# Patient Record
Sex: Female | Born: 1955 | Race: White | Hispanic: No | Marital: Single | Smoking: Current every day smoker
Health system: Southern US, Community
[De-identification: ages and names within clinical notes are randomized; demographics above are authoritative.]

## PROBLEM LIST (undated history)

## (undated) DIAGNOSIS — F988 Other specified behavioral and emotional disorders with onset usually occurring in childhood and adolescence: Secondary | ICD-10-CM

## (undated) DIAGNOSIS — F419 Anxiety disorder, unspecified: Secondary | ICD-10-CM

## (undated) DIAGNOSIS — Z87442 Personal history of urinary calculi: Secondary | ICD-10-CM

## (undated) DIAGNOSIS — K219 Gastro-esophageal reflux disease without esophagitis: Secondary | ICD-10-CM

## (undated) DIAGNOSIS — M5126 Other intervertebral disc displacement, lumbar region: Secondary | ICD-10-CM

## (undated) DIAGNOSIS — F32A Depression, unspecified: Secondary | ICD-10-CM

## (undated) DIAGNOSIS — F1990 Other psychoactive substance use, unspecified, uncomplicated: Secondary | ICD-10-CM

## (undated) DIAGNOSIS — Z720 Tobacco use: Secondary | ICD-10-CM

## (undated) DIAGNOSIS — J449 Chronic obstructive pulmonary disease, unspecified: Secondary | ICD-10-CM

## (undated) DIAGNOSIS — K311 Adult hypertrophic pyloric stenosis: Secondary | ICD-10-CM

## (undated) DIAGNOSIS — Z87440 Personal history of urinary (tract) infections: Secondary | ICD-10-CM

## (undated) DIAGNOSIS — F329 Major depressive disorder, single episode, unspecified: Secondary | ICD-10-CM

## (undated) DIAGNOSIS — M5136 Other intervertebral disc degeneration, lumbar region: Secondary | ICD-10-CM

## (undated) DIAGNOSIS — K259 Gastric ulcer, unspecified as acute or chronic, without hemorrhage or perforation: Secondary | ICD-10-CM

## (undated) DIAGNOSIS — M51369 Other intervertebral disc degeneration, lumbar region without mention of lumbar back pain or lower extremity pain: Secondary | ICD-10-CM

## (undated) DIAGNOSIS — D259 Leiomyoma of uterus, unspecified: Secondary | ICD-10-CM

## (undated) DIAGNOSIS — E785 Hyperlipidemia, unspecified: Secondary | ICD-10-CM

## (undated) HISTORY — DX: Other psychoactive substance use, unspecified, uncomplicated: F19.90

## (undated) HISTORY — DX: Leiomyoma of uterus, unspecified: D25.9

## (undated) HISTORY — PX: EXPLORATION MIDDLE EAR: SUR585

## (undated) HISTORY — DX: Other specified behavioral and emotional disorders with onset usually occurring in childhood and adolescence: F98.8

## (undated) HISTORY — PX: TONSILLECTOMY: SUR1361

## (undated) HISTORY — DX: Chronic obstructive pulmonary disease, unspecified: J44.9

## (undated) HISTORY — DX: Tobacco use: Z72.0

## (undated) HISTORY — DX: Personal history of urinary (tract) infections: Z87.440

## (undated) HISTORY — PX: HERNIA REPAIR: SHX51

## (undated) HISTORY — DX: Personal history of urinary calculi: Z87.442

## (undated) HISTORY — PX: ESOPHAGEAL DILATION: SHX303

## (undated) HISTORY — DX: Gastro-esophageal reflux disease without esophagitis: K21.9

## (undated) HISTORY — PX: FETAL SURGERY FOR CONGENITAL HERNIA: SHX1618

## (undated) HISTORY — DX: Adult hypertrophic pyloric stenosis: K31.1

## (undated) HISTORY — DX: Major depressive disorder, single episode, unspecified: F32.9

## (undated) HISTORY — DX: Depression, unspecified: F32.A

## (undated) HISTORY — DX: Gastric ulcer, unspecified as acute or chronic, without hemorrhage or perforation: K25.9

## (undated) HISTORY — DX: Anxiety disorder, unspecified: F41.9

---

## 1983-10-27 HISTORY — PX: LITHOTRIPSY: SUR834

## 2005-08-06 ENCOUNTER — Emergency Department (HOSPITAL_COMMUNITY): Admission: EM | Admit: 2005-08-06 | Discharge: 2005-08-06 | Payer: Self-pay | Admitting: Emergency Medicine

## 2005-08-06 IMAGING — US US ABDOMEN COMPLETE
1 series · 13 of 25 positions shown · non-contrast
Comparison: None.

CLINICAL DATA: Abdominal pain.
ABDOMEN ULTRASOUND:
TECHNIQUE: Complete abdominal ultrasound examination was performed including evaluation of the liver, gallbladder, bile ducts, pancreas, kidneys, spleen, IVC, and abdominal aorta.

[Series 1: unknown · 0.28mm/px · 13 of 91 slices shown]
[im 1/91]
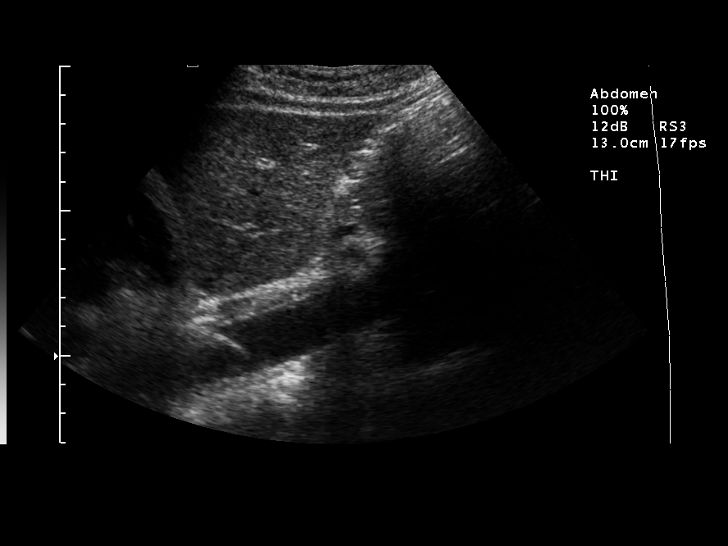
[im 8/91]
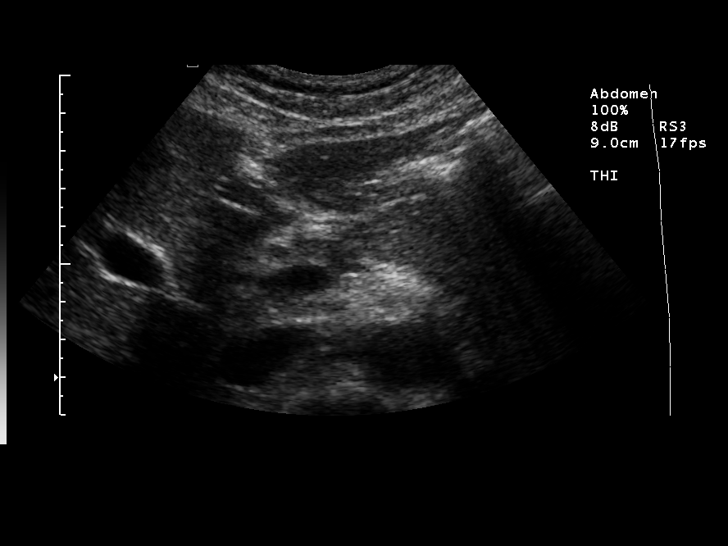
[im 16/91]
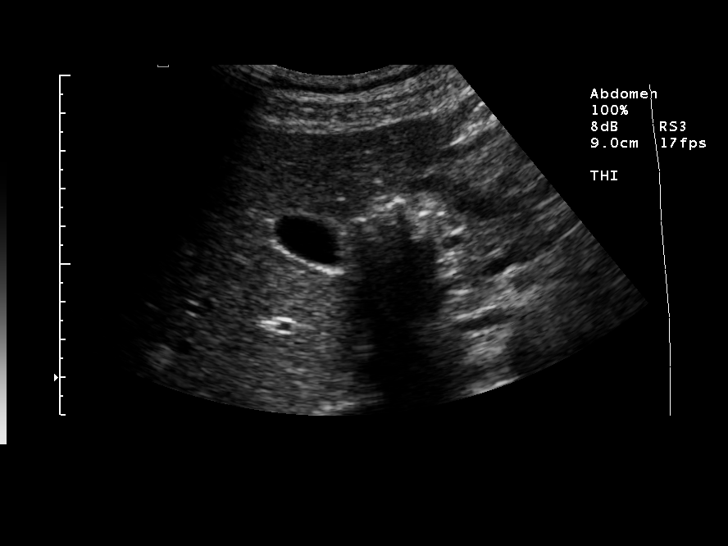
[im 23/91]
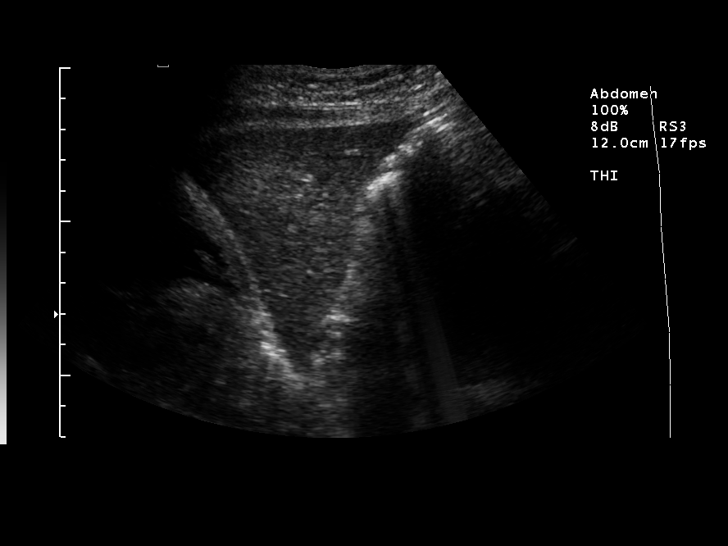
[im 31/91]
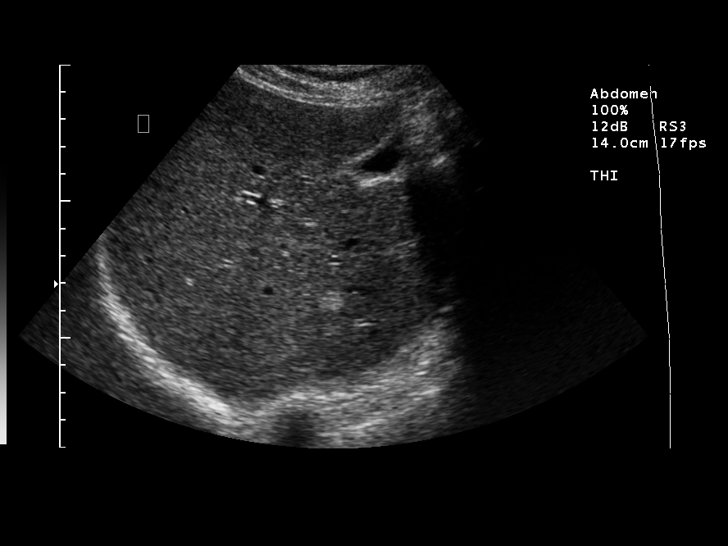
[im 38/91]
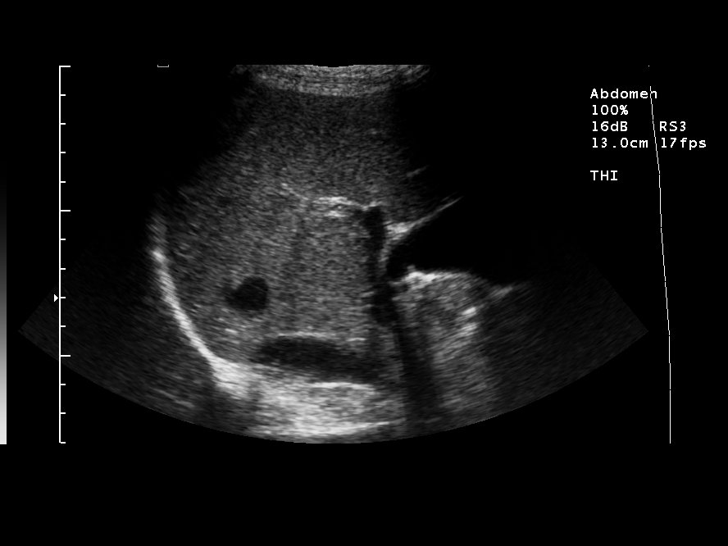
[im 46/91]
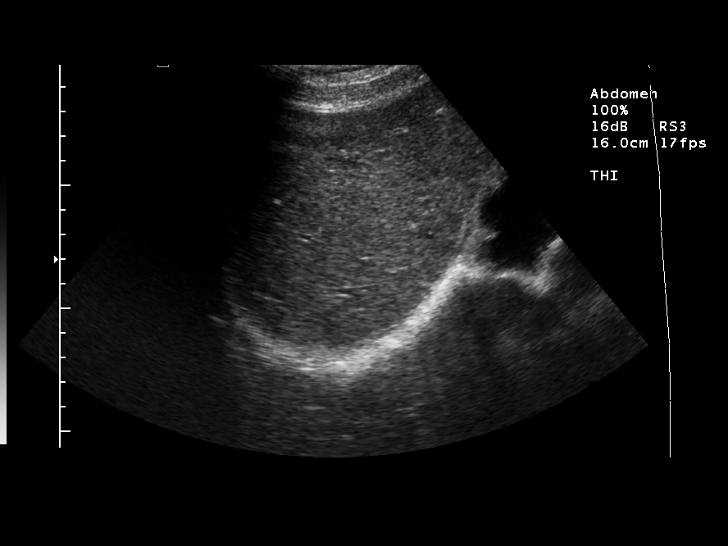
[im 53/91]
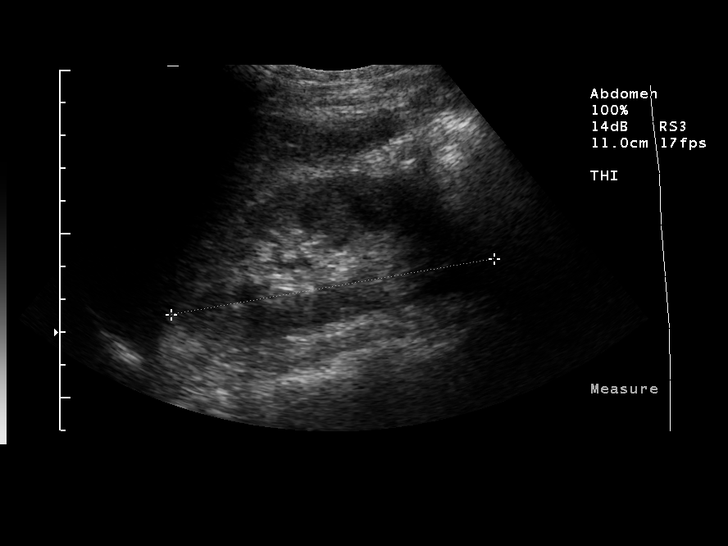
[im 61/91]
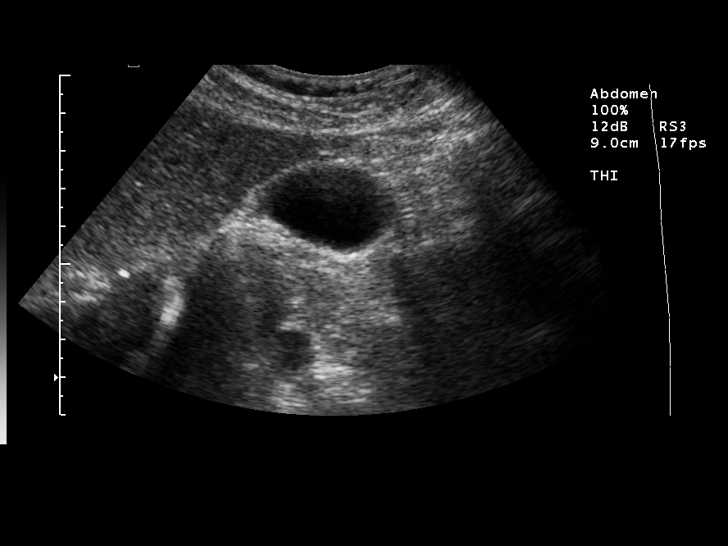
[im 68/91]
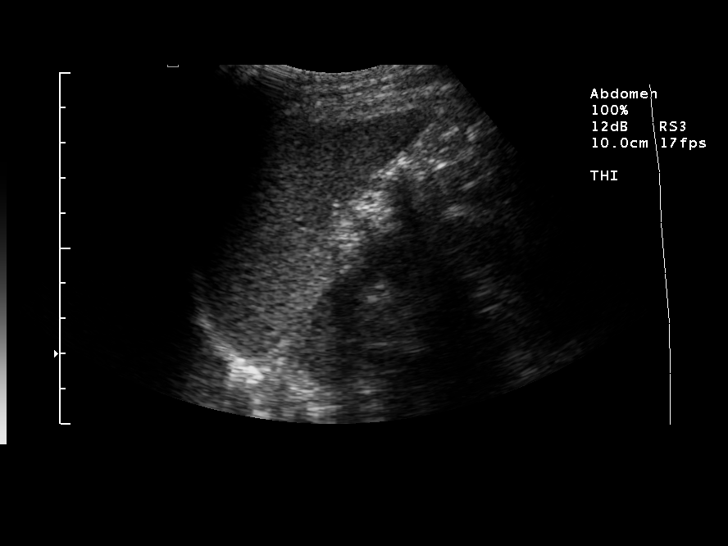
[im 76/91]
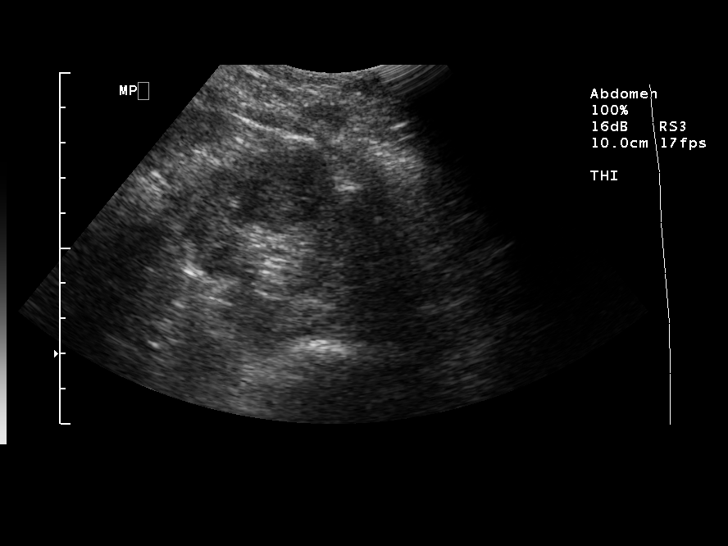
[im 83/91]
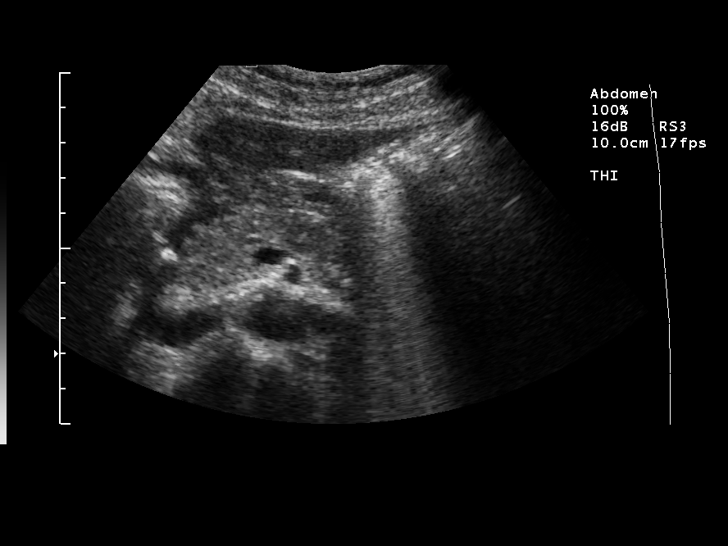
[im 91/91]
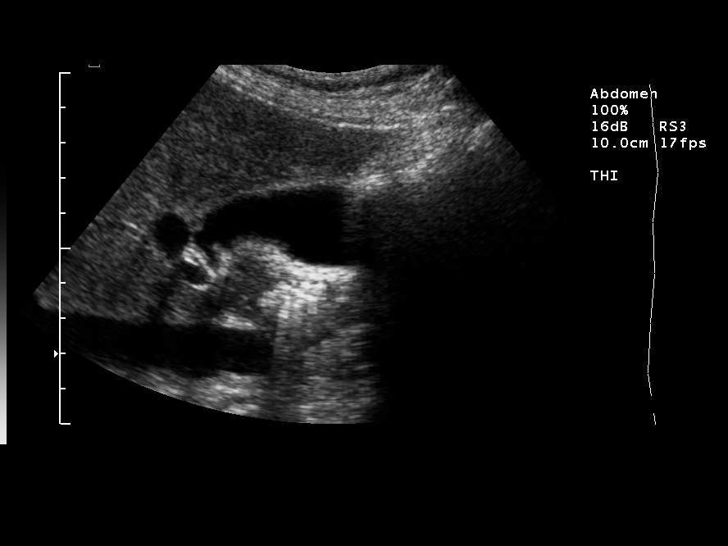

[13 of 25 positions shown; findings below may reference images not displayed]

FINDINGS: The gallbladder appears normal without gallstones or wall thickening.  There is no biliary dilatation. A single uniformly echogenic lesion is noted in the right upper hepatic lobe, measuring 1 cm in greatest dimension. This is most likely an incidental hemangioma.  No other liver lesions are identified.  There is no biliary dilatation. The visualized portions of the spleen, pancreas, inferior vena cava and abdominal aorta appear normal. Portions of the pancreatic tail are obscured by bowel gas. Both kidneys appear normal measuring 10.0 cm in length on the right [OQ] cm on the left.  There is no ascites.
IMPRESSION: 1.  No acute abdominal findings are demonstrated.
2.  Single echogenic liver lesion most likely reflects a hemangioma.  Its stability could be assessed by performing a follow-up hepatic ultrasound in 4 to 6 months.  If it were clinically necessary to evaluate this further now, contrast enhanced MRI would be the optimal modality.

## 2005-08-17 ENCOUNTER — Ambulatory Visit: Payer: Self-pay | Admitting: Internal Medicine

## 2005-08-24 ENCOUNTER — Ambulatory Visit: Payer: Self-pay | Admitting: Internal Medicine

## 2005-08-24 ENCOUNTER — Ambulatory Visit: Payer: Self-pay | Admitting: Gastroenterology

## 2005-09-07 ENCOUNTER — Ambulatory Visit: Payer: Self-pay | Admitting: Gastroenterology

## 2005-09-07 ENCOUNTER — Encounter (INDEPENDENT_AMBULATORY_CARE_PROVIDER_SITE_OTHER): Payer: Self-pay | Admitting: *Deleted

## 2005-10-09 ENCOUNTER — Ambulatory Visit: Payer: Self-pay | Admitting: Internal Medicine

## 2005-10-22 ENCOUNTER — Encounter: Payer: Self-pay | Admitting: Internal Medicine

## 2005-10-22 ENCOUNTER — Ambulatory Visit: Payer: Self-pay

## 2005-10-30 ENCOUNTER — Ambulatory Visit: Payer: Self-pay | Admitting: Internal Medicine

## 2006-07-01 ENCOUNTER — Ambulatory Visit: Payer: Self-pay | Admitting: Internal Medicine

## 2006-07-06 ENCOUNTER — Ambulatory Visit (HOSPITAL_COMMUNITY): Admission: RE | Admit: 2006-07-06 | Discharge: 2006-07-06 | Payer: Self-pay | Admitting: Internal Medicine

## 2006-07-06 ENCOUNTER — Ambulatory Visit: Payer: Self-pay | Admitting: Internal Medicine

## 2006-09-23 IMAGING — CR DG CHEST 1V PORT
1 series · 1 of 1 positions shown · non-contrast
Comparison: None.

CLINICAL DATA: Chest pain. 
 PORTABLE CHEST - [W1]:

[view not recorded]
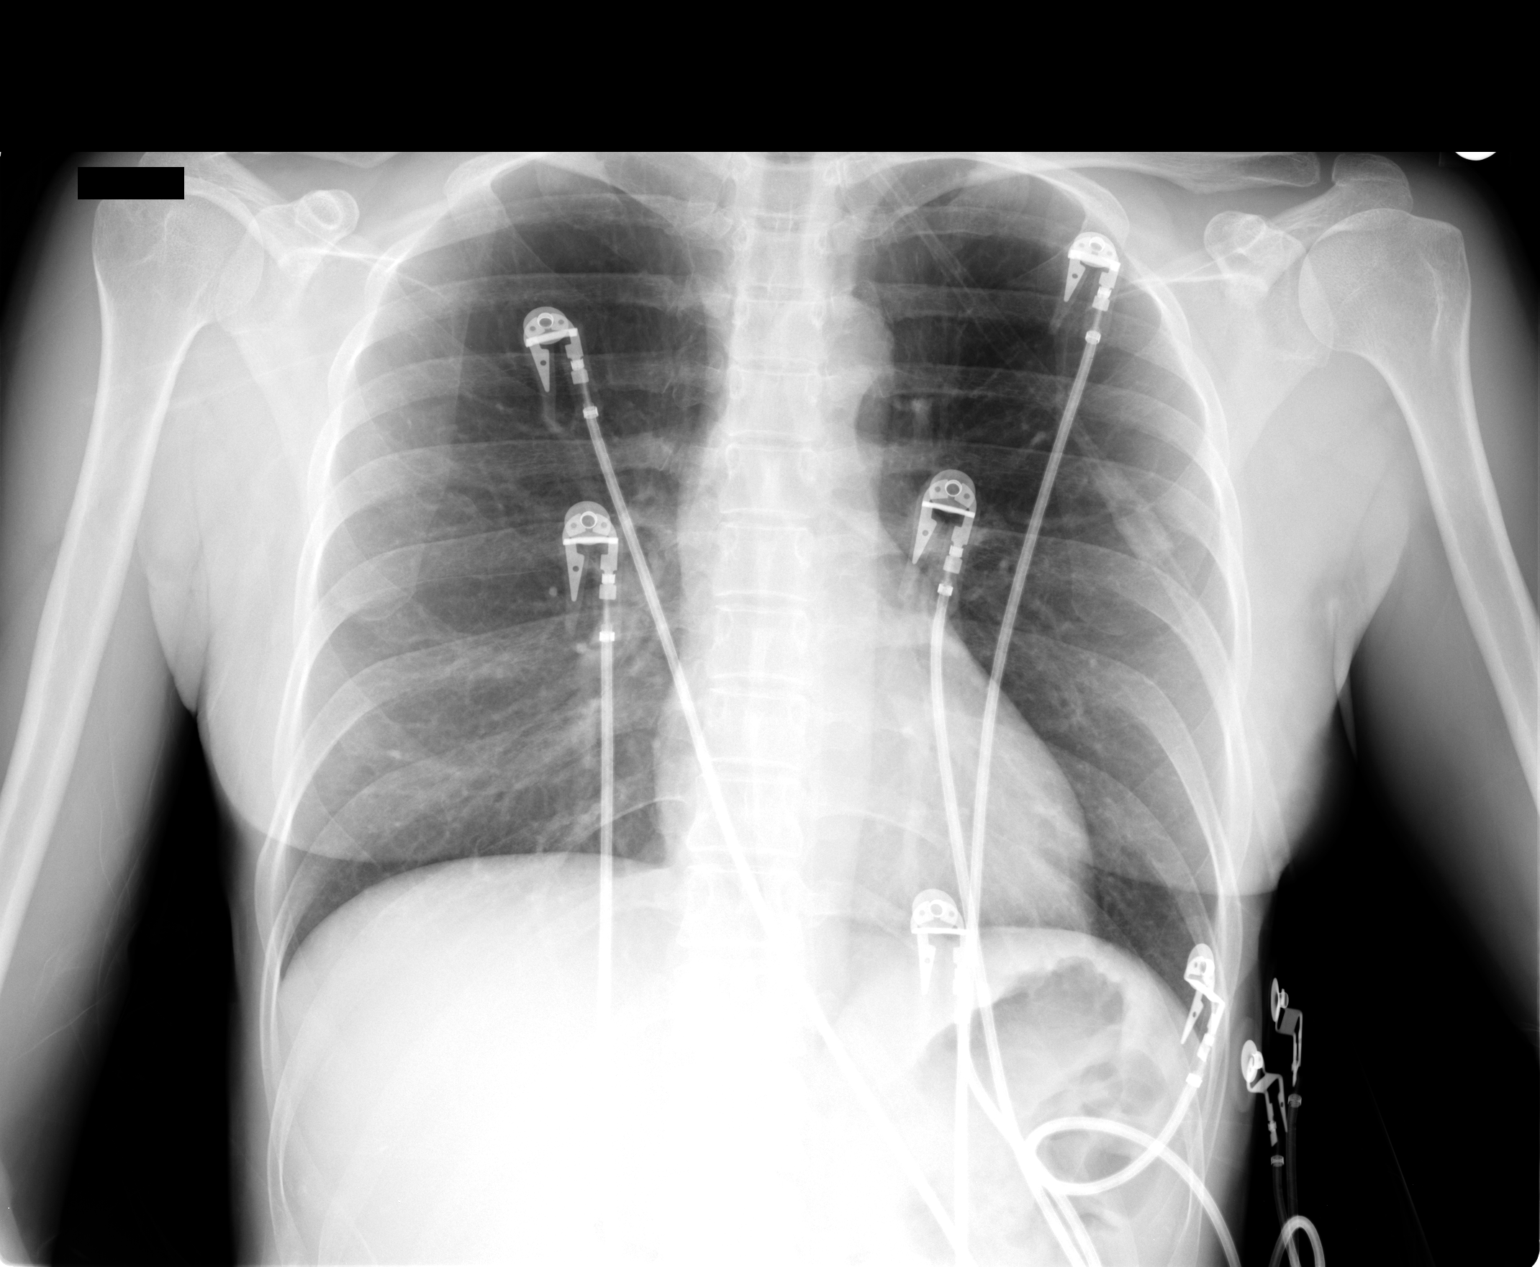

[1 of 1 positions shown; findings below may reference images not displayed]

FINDINGS: Heart and lung s within normal limits for a one view portable exam.
IMPRESSION: No active disease.

## 2007-08-23 IMAGING — CR DG RIBS W/ CHEST 3+V*R*
5 series · 5 of 5 positions shown · non-contrast
Comparison: Portable chest [DATE].

CLINICAL DATA: Pain after a fall.  
 RIGHT RIBS - 4 VIEW AND 1 VIEW CHEST:

[w chest pa]
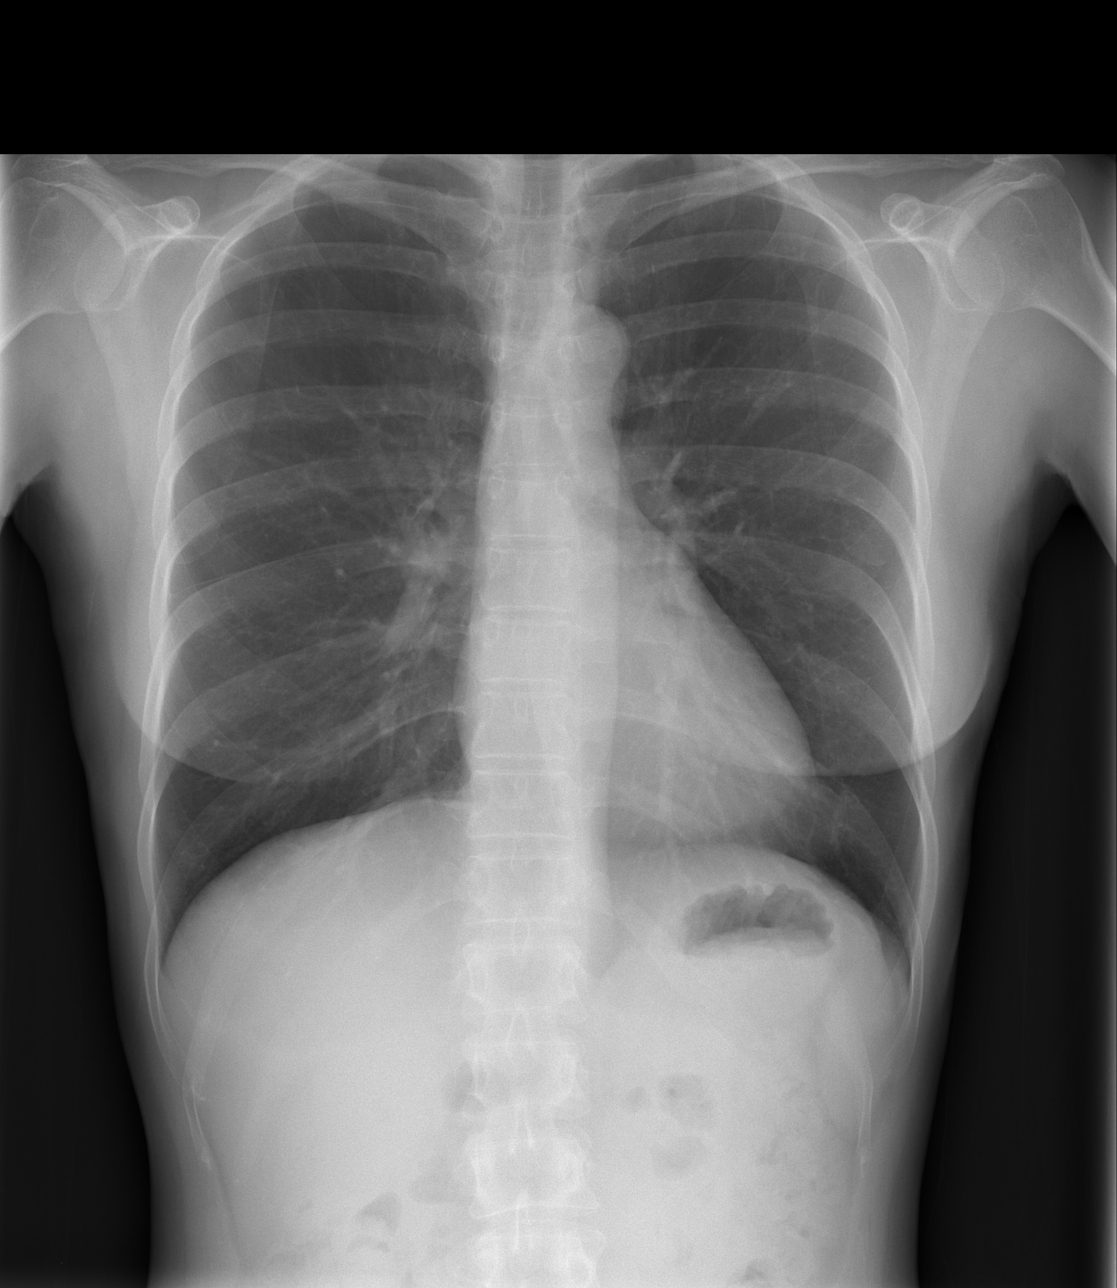

[w ribs ap/pa upper right *]
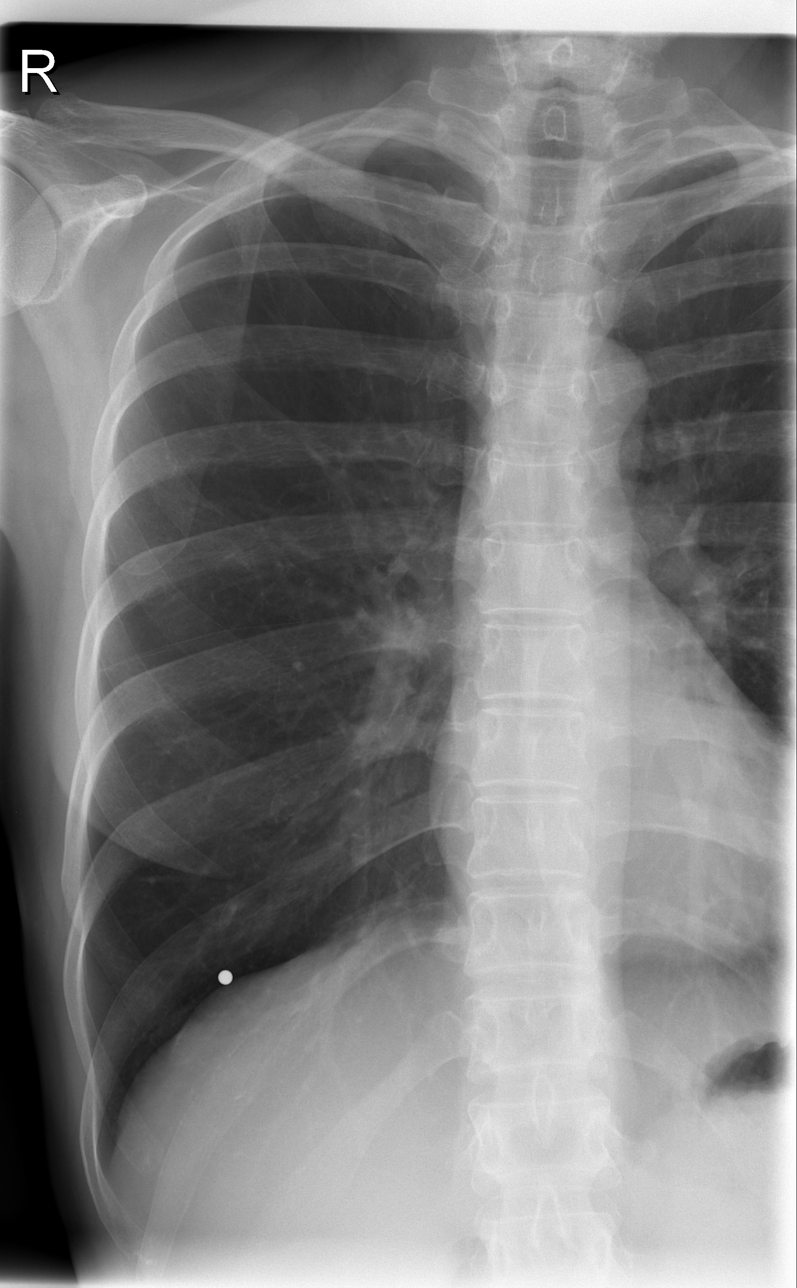

[w ribs ap/pa lower right]
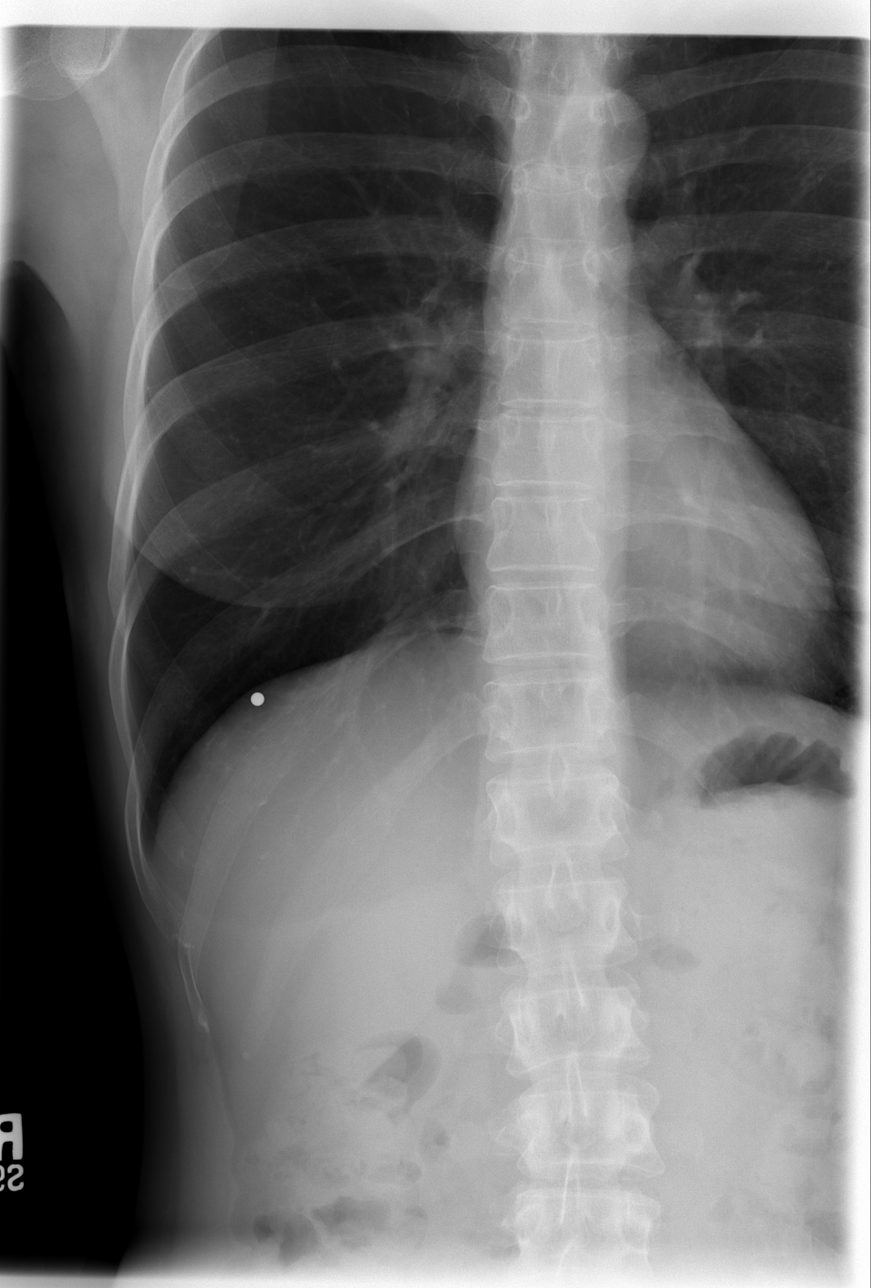

[w ribs oblique right (1 of 2)]
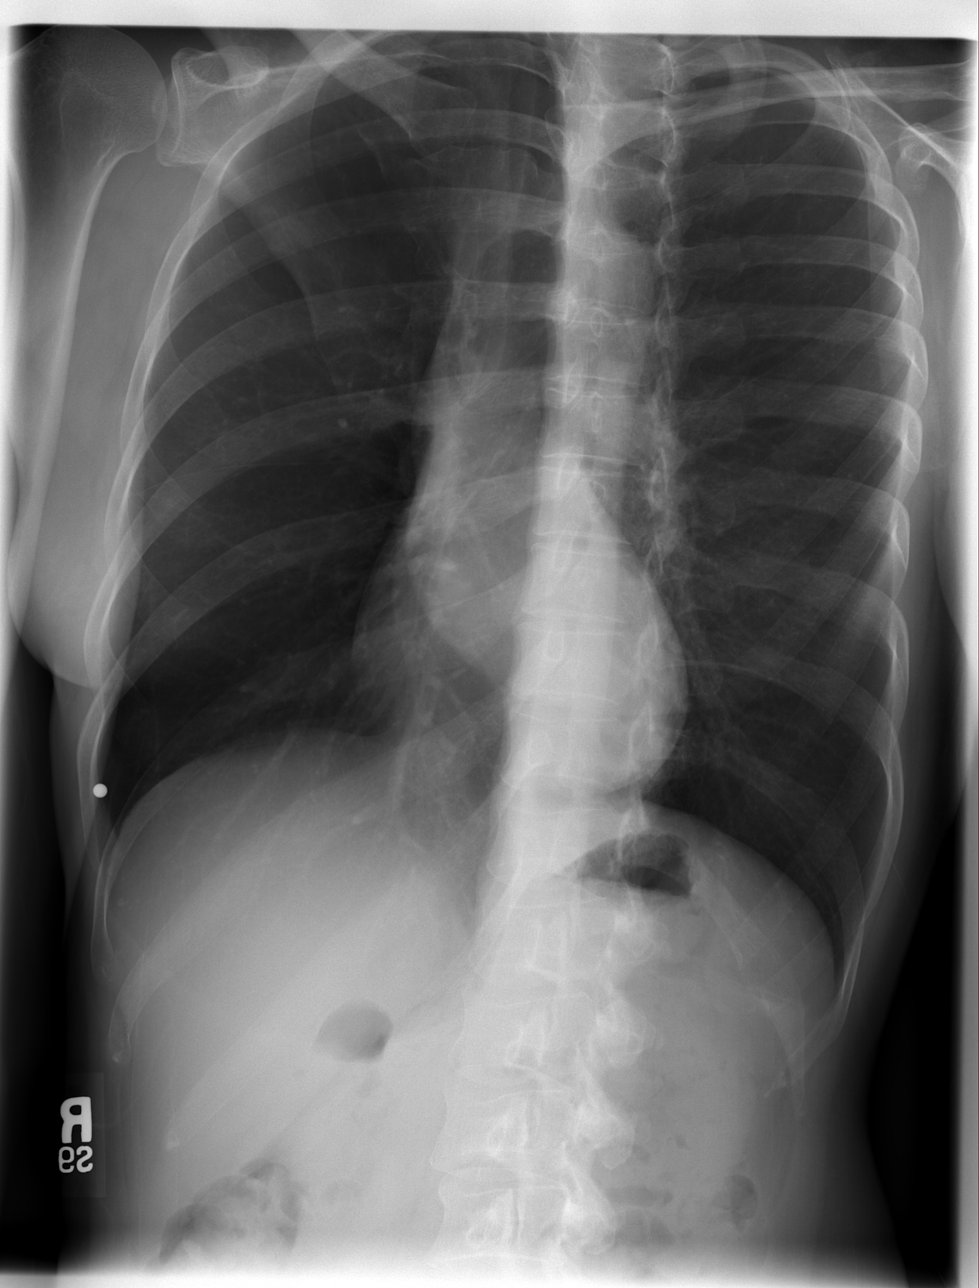

[w ribs oblique right (2 of 2)]
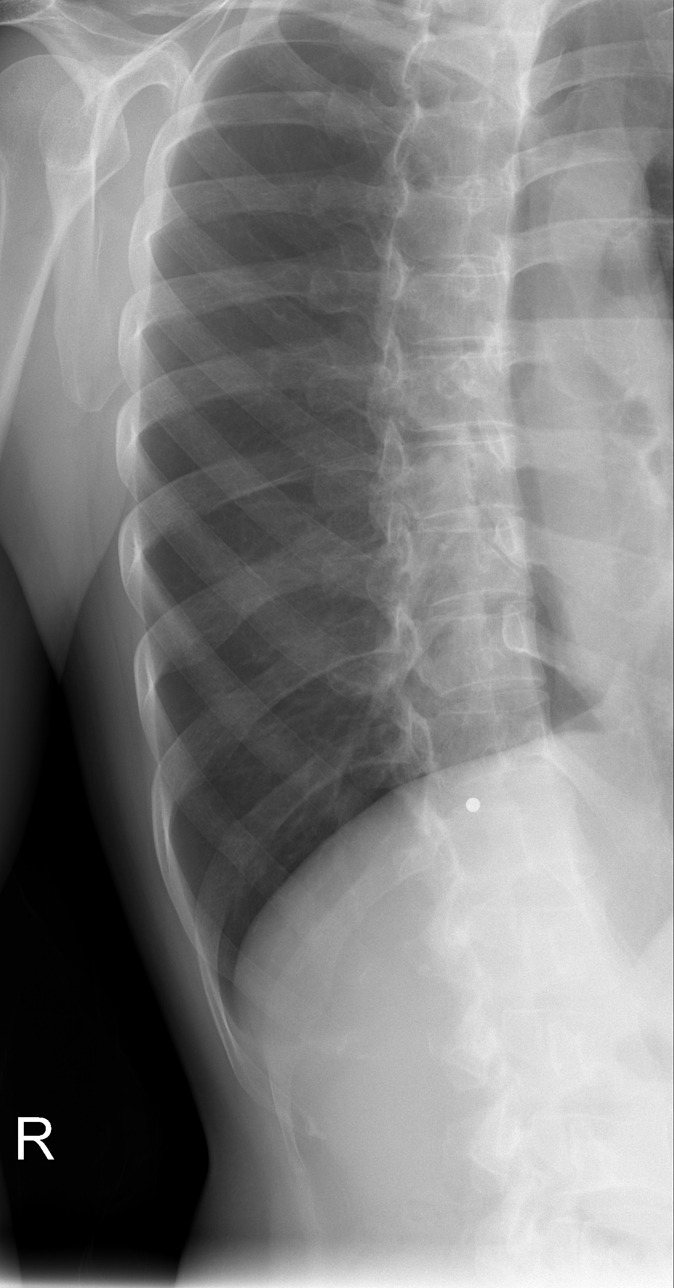

[5 of 5 positions shown; findings below may reference images not displayed]

FINDINGS: Lungs are clear.  No pneumothorax.  Heart size is normal.  No effusion. There is no rib fracture.
IMPRESSION: Negative exam.

## 2008-07-06 ENCOUNTER — Ambulatory Visit: Payer: Self-pay | Admitting: Internal Medicine

## 2008-07-06 DIAGNOSIS — R109 Unspecified abdominal pain: Secondary | ICD-10-CM

## 2008-07-06 LAB — CONVERTED CEMR LAB
BUN: 15 mg/dL (ref 6–23)
Basophils Absolute: 0.1 10*3/uL (ref 0.0–0.1)
Basophils Relative: 1 % (ref 0–1)
Bilirubin Urine: NEGATIVE
CO2: 26 meq/L (ref 19–32)
Calcium: 10 mg/dL (ref 8.4–10.5)
Chloride: 101 meq/L (ref 96–112)
Creatinine, Ser: 0.66 mg/dL (ref 0.40–1.20)
Eosinophils Absolute: 0.1 10*3/uL (ref 0.0–0.7)
Eosinophils Relative: 1 % (ref 0–5)
Glucose, Bld: 85 mg/dL (ref 70–99)
Glucose, Urine, Semiquant: NEGATIVE
HCT: 42.8 % (ref 36.0–46.0)
Hemoglobin: 14.2 g/dL (ref 12.0–15.0)
Ketones, urine, test strip: NEGATIVE
Lymphocytes Relative: 29 % (ref 12–46)
Lymphs Abs: 3 10*3/uL (ref 0.7–4.0)
MCHC: 33.2 g/dL (ref 30.0–36.0)
MCV: 93.4 fL (ref 78.0–100.0)
Monocytes Absolute: 0.6 10*3/uL (ref 0.1–1.0)
Monocytes Relative: 6 % (ref 3–12)
Neutro Abs: 6.6 10*3/uL (ref 1.7–7.7)
Neutrophils Relative %: 64 % (ref 43–77)
Nitrite: POSITIVE
Platelets: 283 10*3/uL (ref 150–400)
Potassium: 4.4 meq/L (ref 3.5–5.3)
Protein, U semiquant: 30
RBC: 4.58 M/uL (ref 3.87–5.11)
RDW: 13.2 % (ref 11.5–15.5)
Sodium: 137 meq/L (ref 135–145)
Specific Gravity, Urine: 1.025
Urobilinogen, UA: 0.2
WBC: 10.4 10*3/uL (ref 4.0–10.5)
pH: 6

## 2008-07-07 ENCOUNTER — Telehealth: Payer: Self-pay | Admitting: Internal Medicine

## 2008-07-10 ENCOUNTER — Ambulatory Visit: Payer: Self-pay | Admitting: Cardiology

## 2008-07-10 IMAGING — CT CT ABDOMEN WO/W CM
2 of 9 series · 12 of 46 positions shown, 18 images · IV contrast (agent unspecified)
Comparison: None

CT ABDOMEN

CLINICAL DATA: Bilateral flank pain, urinary tract infection,
history of kidney stones and history of liver lesion most
consistent with hemangioma by ultrasound.

CT ABDOMEN WITHOUT AND WITH CONTRAST
CT PELVIS WITH CONTRAST
TECHNIQUE: Multidetector CT imaging of the abdomen was performed
initially following the standard protocol before administration of
intravenous contrast.  Multidetector CT imaging of the abdomen and
pelvis was then performed following the standard protocol during
the bolus injection of intravenous contrast.
Contrast: 80 ml [ZL]

[Series 3: abd_pel_(id) 5.0 b30f st · axial · 0.56mm/px · z∈[-420,-106]mm · 9 of 78 slices shown, 15 images]
[im 8/78  soft-tissue]
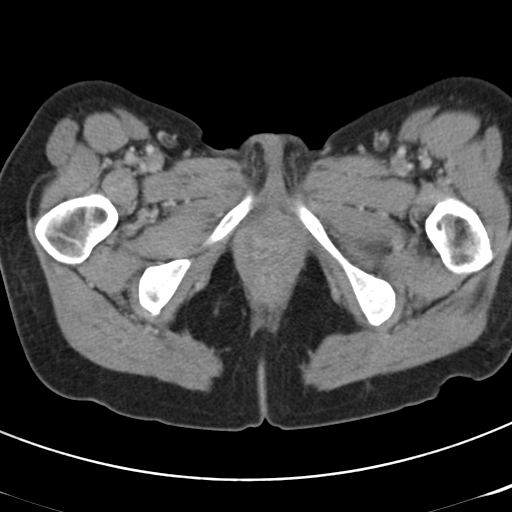
[im 8/78  bone]
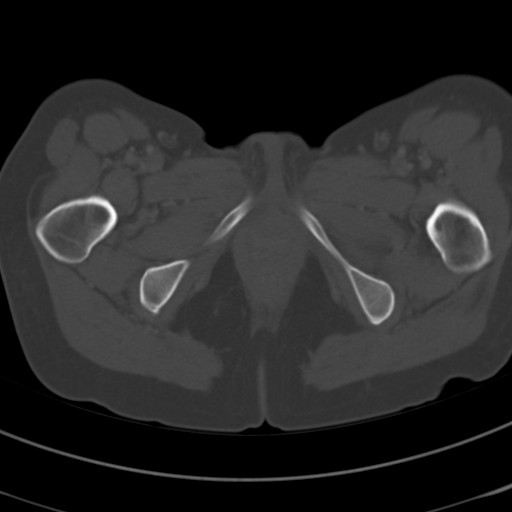
[im 15/78  soft-tissue]
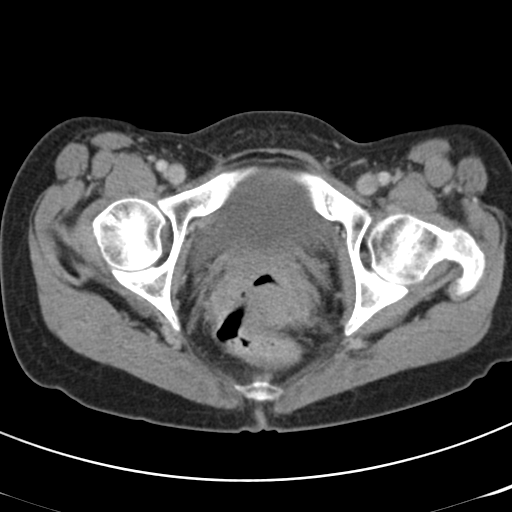
[im 22/78  soft-tissue]
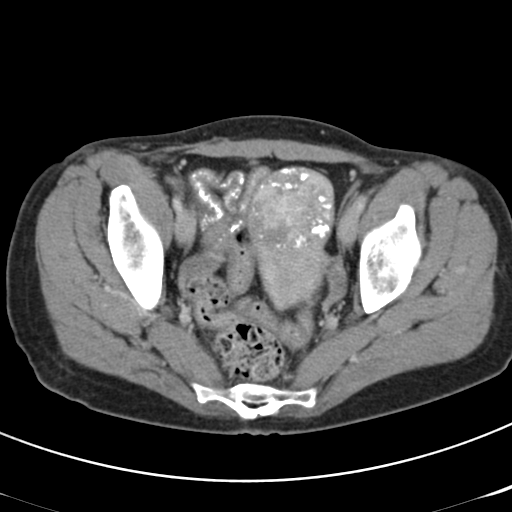
[im 29/78  soft-tissue]
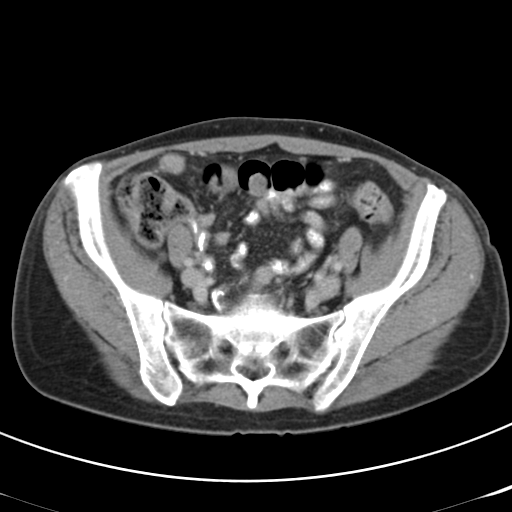
[im 43/78  soft-tissue]
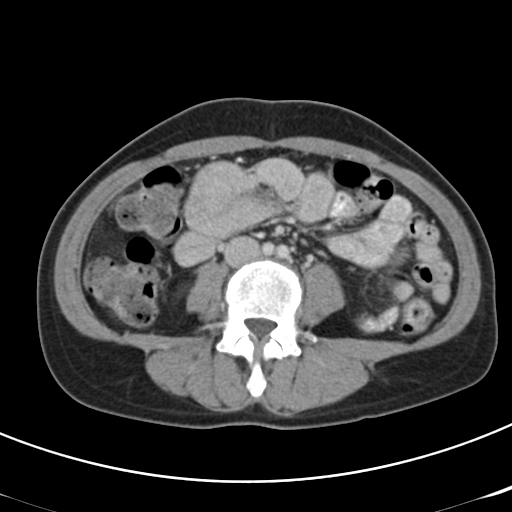
[im 50/78  soft-tissue]
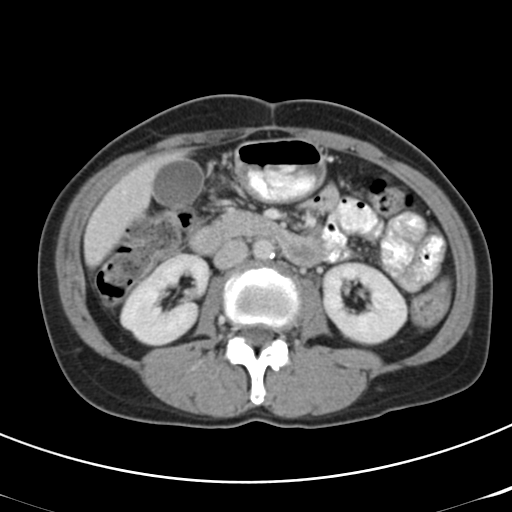
[im 50/78  lung]
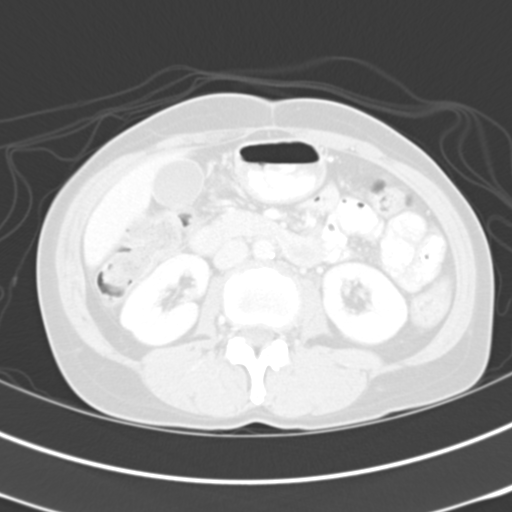
[im 57/78  soft-tissue]
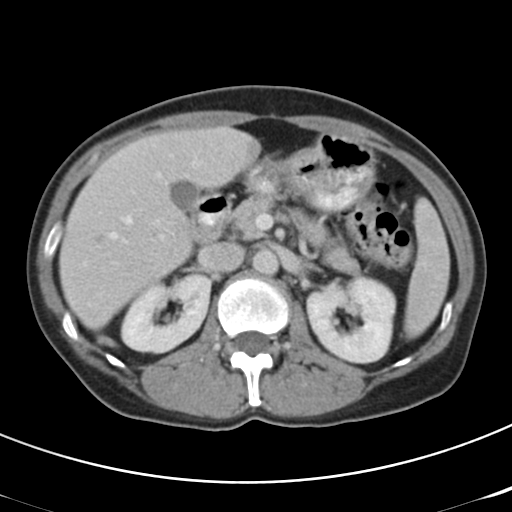
[im 57/78  lung]
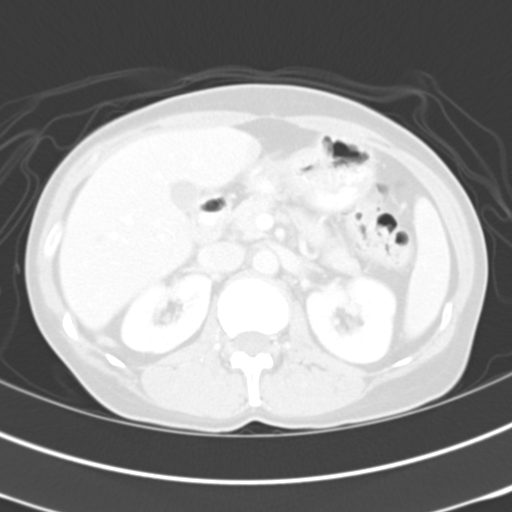
[im 64/78  soft-tissue]
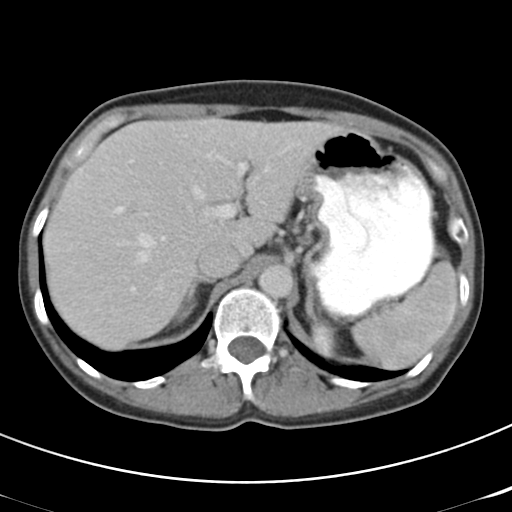
[im 64/78  lung]
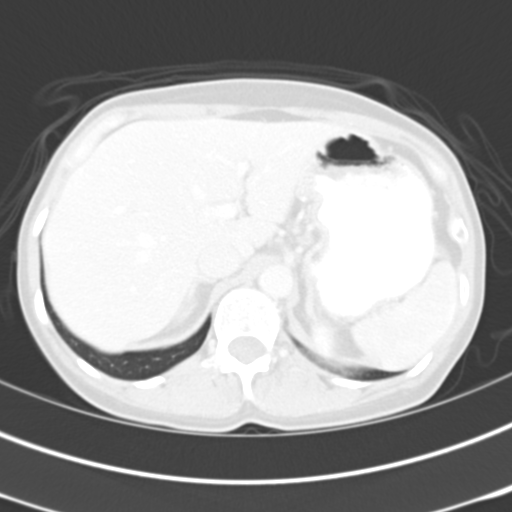
[im 71/78  soft-tissue]
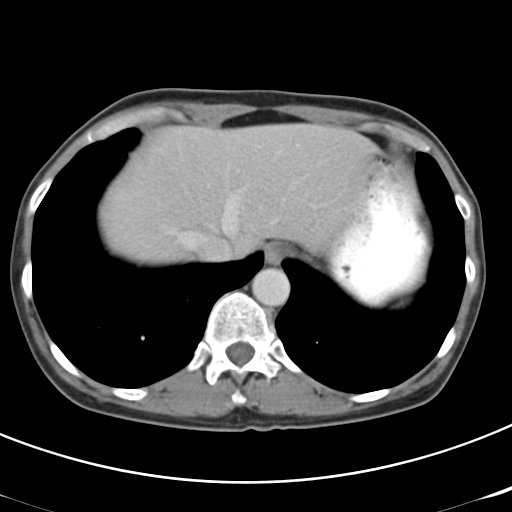
[im 71/78  lung]
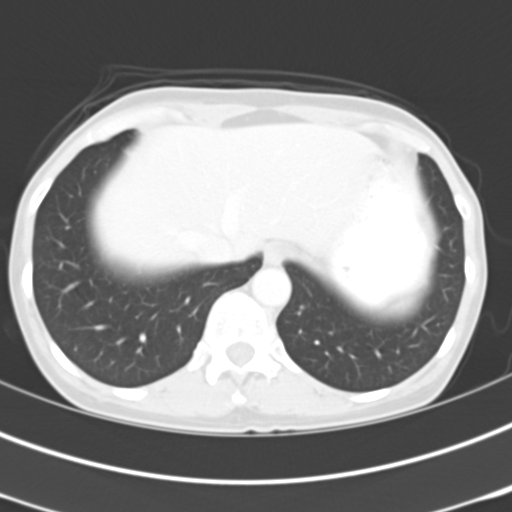
[im 71/78  bone]
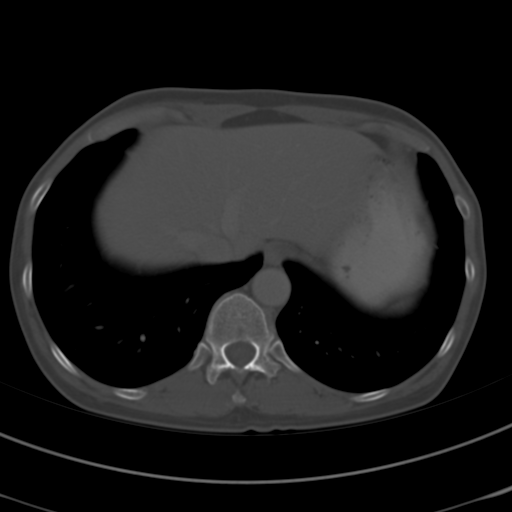

[Series 602: <mpr range> · coronal · 0.56mm/px · 3 of 93 slices shown]
[im 19/93  soft-tissue]
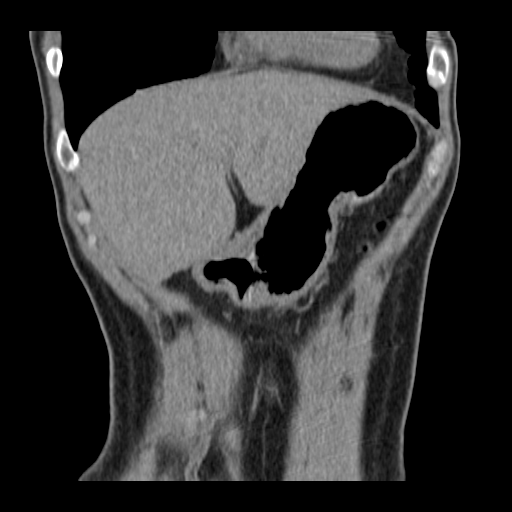
[im 37/93  soft-tissue]
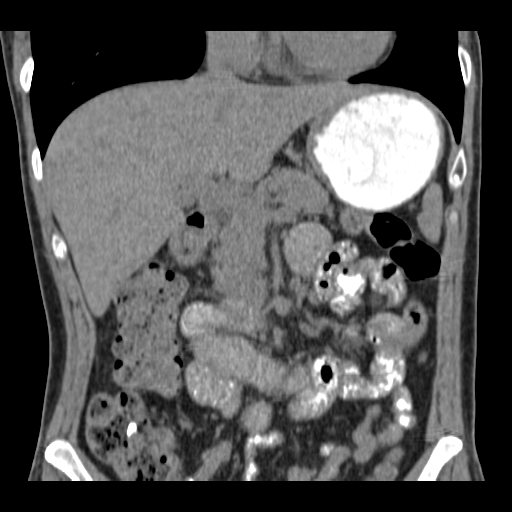
[im 56/93  soft-tissue]
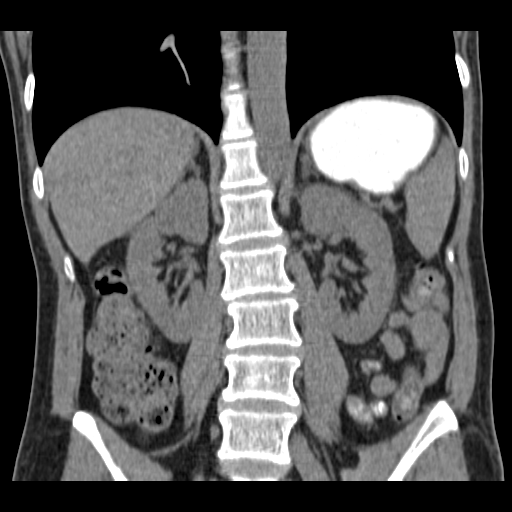

[12 of 46 positions shown; findings below may reference images not displayed]

FINDINGS: The lung bases are clear.  On the unenhanced study,
there is no evidence of renal calculi and no hydronephrosis is
noted.  No calcified gallstones are seen.

After contrast administration, the liver enhances with only a
single one centimeter low attenuation structure in the lower right
lobe.  This lesion does correspond to the echogenic focus in the
lower right lobe by ultrasound and does appear to become isodense
on delayed images, most consistent with a small hemangioma.  No
other hepatic lesion is seen.  The gallbladder wall is not
thickened.  The pancreas is normal in size and the pancreatic duct
is not dilated.  The adrenal glands and spleen appear normal.  The
kidneys enhance, and on delayed images the pelvocaliceal systems
appear normal.  The abdominal aorta is normal in caliber.  No
adenopathy is seen.
IMPRESSION: 1.  No renal calculi.  No hydronephrosis.
2.  Small low attenuation lesion in the lower right lobe of liver
most consistent with hemangioma as described above.

CT PELVIS
FINDINGS: The urinary bladder is unremarkable although it is
indented superiorly by an enlarged uterus with calcification
consistent with multiple fibroids.  A moderate amount of feces is
noted scattered throughout the colon.  No other pelvic mass is seen
and no fluid is noted within the pelvis.
IMPRESSION: 1. Prominent uterus with calcifications consistent with multiple
uterine fibroids.  No other pelvic mass or adenopathy.
2.  Moderate amount of feces throughout the colon.

## 2008-07-11 ENCOUNTER — Telehealth: Payer: Self-pay | Admitting: Internal Medicine

## 2008-07-12 ENCOUNTER — Ambulatory Visit: Payer: Self-pay | Admitting: Internal Medicine

## 2008-07-12 DIAGNOSIS — D259 Leiomyoma of uterus, unspecified: Secondary | ICD-10-CM | POA: Insufficient documentation

## 2008-07-12 DIAGNOSIS — K5900 Constipation, unspecified: Secondary | ICD-10-CM | POA: Insufficient documentation

## 2008-07-12 HISTORY — DX: Leiomyoma of uterus, unspecified: D25.9

## 2008-07-25 ENCOUNTER — Ambulatory Visit: Payer: Self-pay | Admitting: Internal Medicine

## 2008-07-25 DIAGNOSIS — J018 Other acute sinusitis: Secondary | ICD-10-CM | POA: Insufficient documentation

## 2008-07-25 HISTORY — DX: Other acute sinusitis: J01.80

## 2008-08-01 ENCOUNTER — Encounter: Payer: Self-pay | Admitting: Internal Medicine

## 2008-08-02 ENCOUNTER — Emergency Department (HOSPITAL_COMMUNITY): Admission: EM | Admit: 2008-08-02 | Discharge: 2008-08-02 | Payer: Self-pay | Admitting: Emergency Medicine

## 2008-08-06 ENCOUNTER — Telehealth: Payer: Self-pay | Admitting: Internal Medicine

## 2008-08-08 ENCOUNTER — Ambulatory Visit: Payer: Self-pay | Admitting: Internal Medicine

## 2008-08-08 ENCOUNTER — Observation Stay (HOSPITAL_COMMUNITY): Admission: EM | Admit: 2008-08-08 | Discharge: 2008-08-10 | Payer: Self-pay | Admitting: Emergency Medicine

## 2008-08-10 ENCOUNTER — Telehealth: Payer: Self-pay | Admitting: Internal Medicine

## 2008-08-15 LAB — CONVERTED CEMR LAB

## 2008-08-16 ENCOUNTER — Ambulatory Visit: Payer: Self-pay | Admitting: Internal Medicine

## 2008-08-16 DIAGNOSIS — F17200 Nicotine dependence, unspecified, uncomplicated: Secondary | ICD-10-CM | POA: Insufficient documentation

## 2008-08-16 DIAGNOSIS — R0789 Other chest pain: Secondary | ICD-10-CM

## 2008-08-16 DIAGNOSIS — R0602 Shortness of breath: Secondary | ICD-10-CM | POA: Insufficient documentation

## 2008-08-16 HISTORY — DX: Other chest pain: R07.89

## 2008-08-29 ENCOUNTER — Telehealth: Payer: Self-pay | Admitting: Internal Medicine

## 2008-08-29 ENCOUNTER — Encounter: Payer: Self-pay | Admitting: Internal Medicine

## 2008-08-29 ENCOUNTER — Ambulatory Visit: Payer: Self-pay | Admitting: Internal Medicine

## 2008-09-05 ENCOUNTER — Ambulatory Visit: Payer: Self-pay | Admitting: Cardiology

## 2008-09-11 ENCOUNTER — Encounter: Payer: Self-pay | Admitting: Internal Medicine

## 2008-09-11 ENCOUNTER — Ambulatory Visit: Payer: Self-pay

## 2008-10-01 ENCOUNTER — Ambulatory Visit: Payer: Self-pay | Admitting: Internal Medicine

## 2008-10-01 DIAGNOSIS — J45909 Unspecified asthma, uncomplicated: Secondary | ICD-10-CM | POA: Insufficient documentation

## 2008-10-01 DIAGNOSIS — M79609 Pain in unspecified limb: Secondary | ICD-10-CM | POA: Insufficient documentation

## 2009-02-09 ENCOUNTER — Emergency Department (HOSPITAL_COMMUNITY): Admission: EM | Admit: 2009-02-09 | Discharge: 2009-02-10 | Payer: Self-pay | Admitting: Emergency Medicine

## 2009-02-09 IMAGING — CR DG CHEST 2V
2 series · 2 of 2 positions shown · non-contrast
Comparison: [DATE]

CLINICAL DATA: Chest pain.  Cough.  Vomiting.

CHEST - 2 VIEW

[w chest pa]
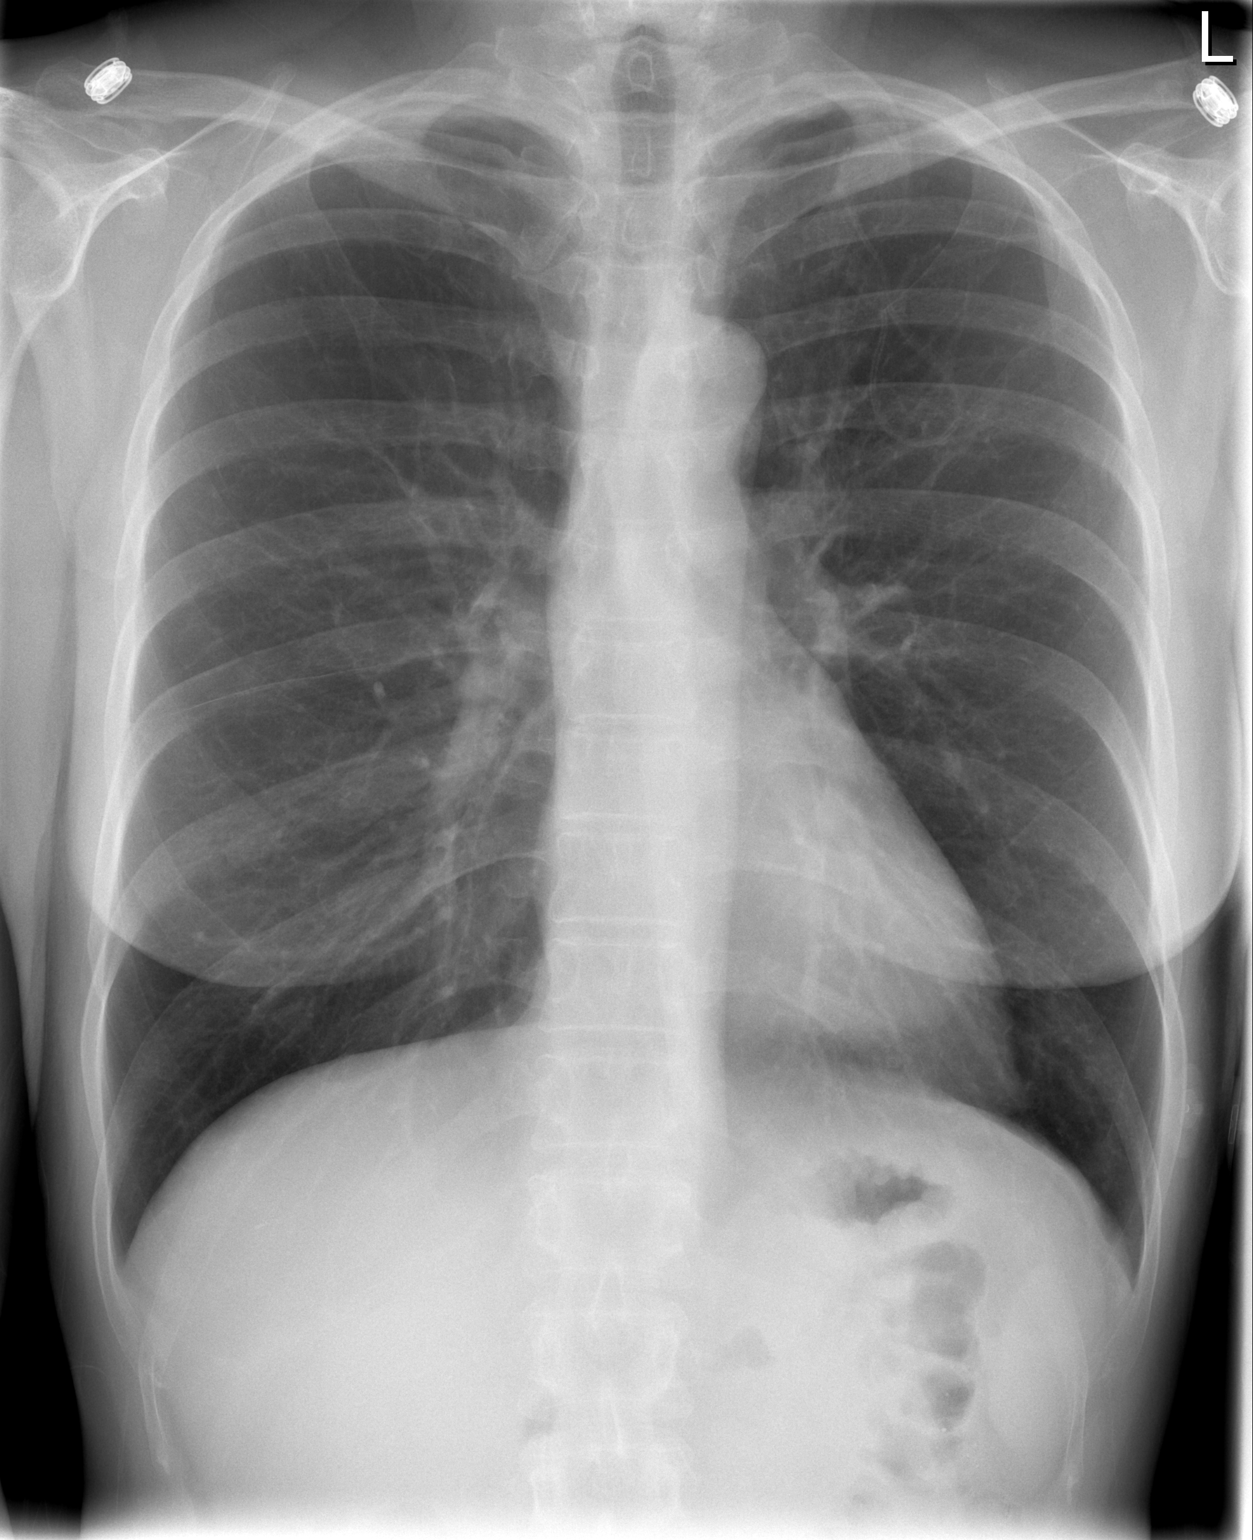

[w chest lat]
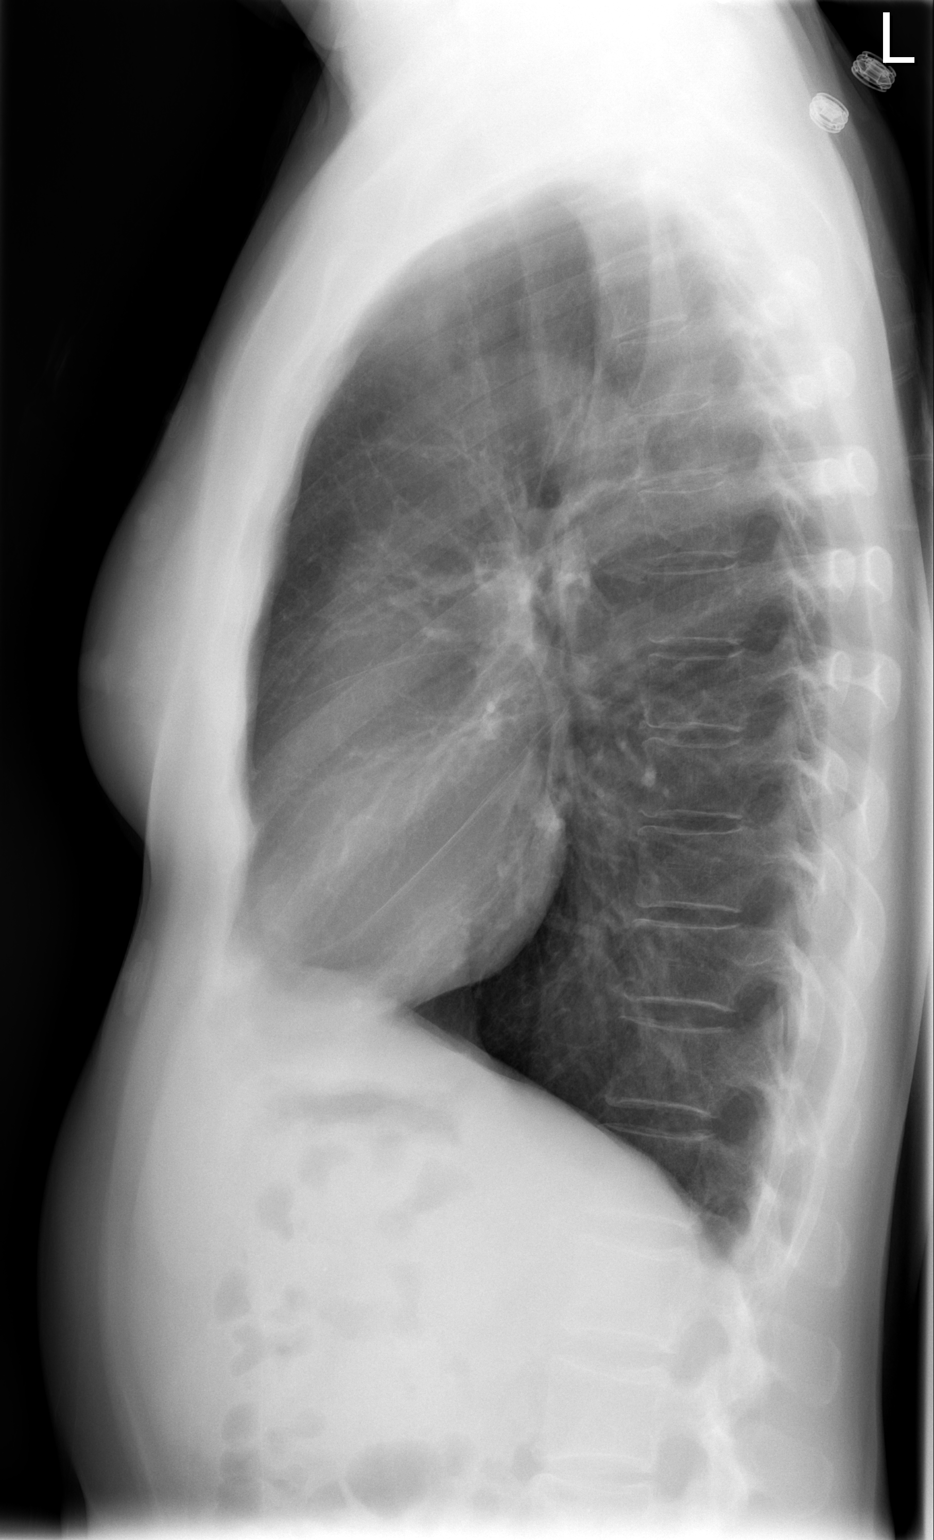

[2 of 2 positions shown; findings below may reference images not displayed]

FINDINGS: The heart size and mediastinal contours are within normal
limits.  Both lungs are clear.  The visualized skeletal structures
are unremarkable.
IMPRESSION: No active cardiopulmonary disease.

## 2009-02-09 IMAGING — CR DG ABDOMEN 2V
2 series · 2 of 2 positions shown · non-contrast
Comparison: None

CLINICAL DATA: Abdominal and chest pain.  Vomiting and diarrhea.

ABDOMEN - 2 VIEW

[w abdomen upright]
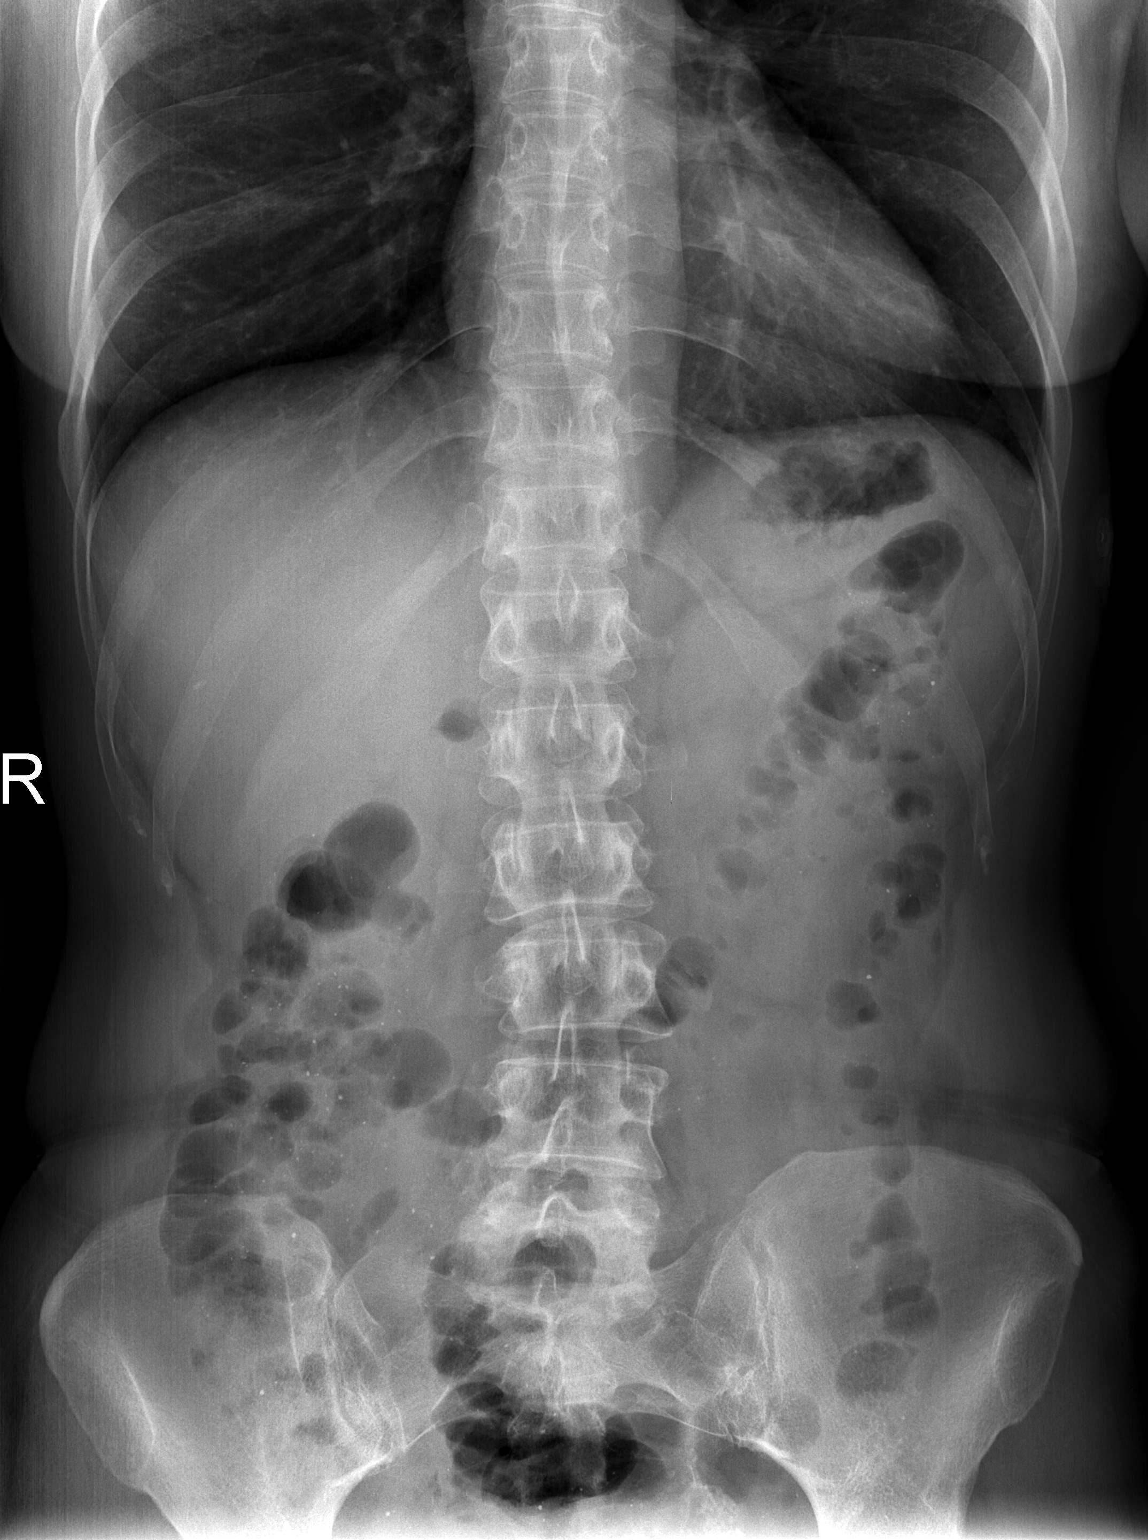

[t abdomen supine]
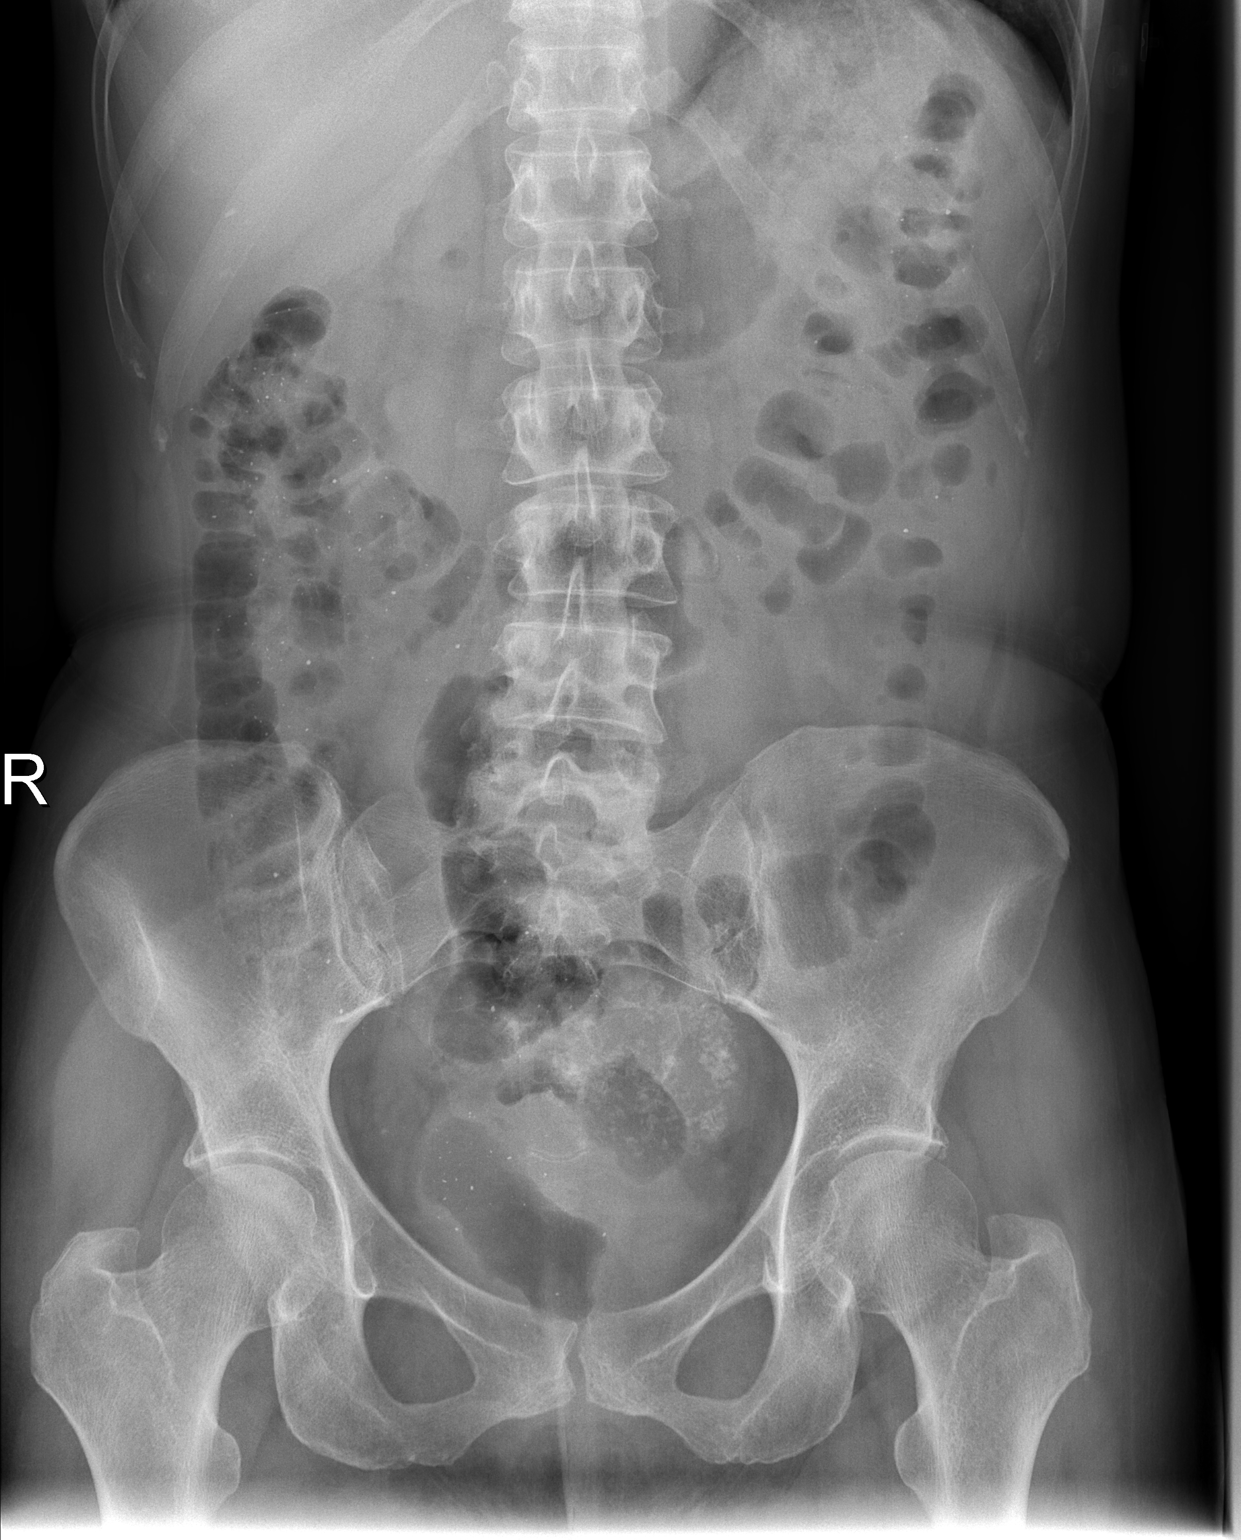

[2 of 2 positions shown; findings below may reference images not displayed]

FINDINGS: The bowel gas pattern is normal.  A large partially
calcified fibroid is seen in the left pelvis that measuring
approximately 6 cm.  There is no evidence of free air.
IMPRESSION: 1.  No acute findings.
2.  Large partially calcified uterine fibroid in the left pelvis
measuring approximately 6 cm.

## 2009-02-12 ENCOUNTER — Ambulatory Visit: Payer: Self-pay | Admitting: Internal Medicine

## 2009-02-12 DIAGNOSIS — R197 Diarrhea, unspecified: Secondary | ICD-10-CM

## 2009-09-02 ENCOUNTER — Encounter (INDEPENDENT_AMBULATORY_CARE_PROVIDER_SITE_OTHER): Payer: Self-pay | Admitting: Otolaryngology

## 2009-09-02 ENCOUNTER — Ambulatory Visit (HOSPITAL_BASED_OUTPATIENT_CLINIC_OR_DEPARTMENT_OTHER): Admission: RE | Admit: 2009-09-02 | Discharge: 2009-09-03 | Payer: Self-pay | Admitting: Otolaryngology

## 2009-09-19 IMAGING — CR DG CHEST 2V
2 series · 2 of 2 positions shown · non-contrast
Comparison: Chest x-ray of [DATE]

CLINICAL DATA: Left-sided chest pain, shortness of breath

CHEST - 2 VIEW

[w chest pa]
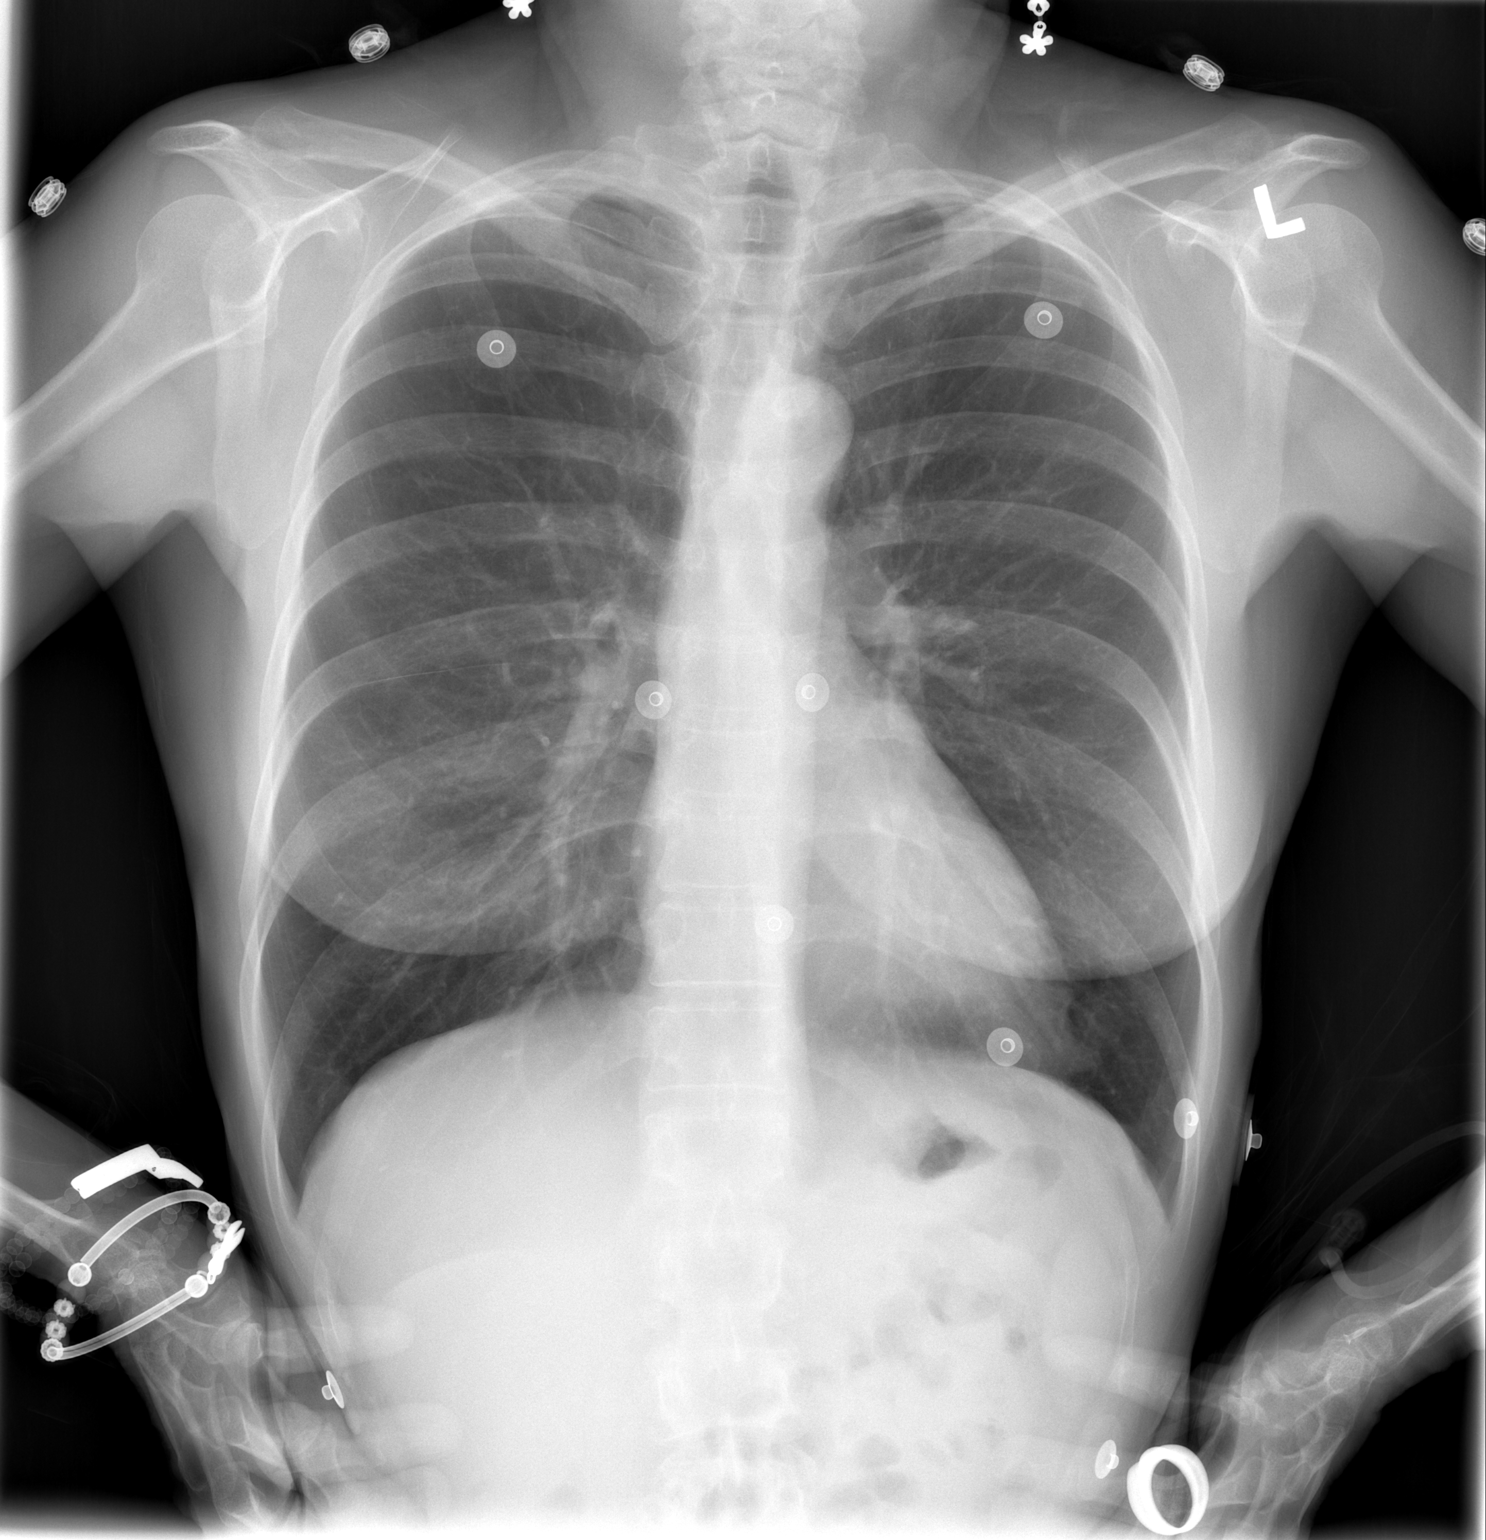

[w chest lat]
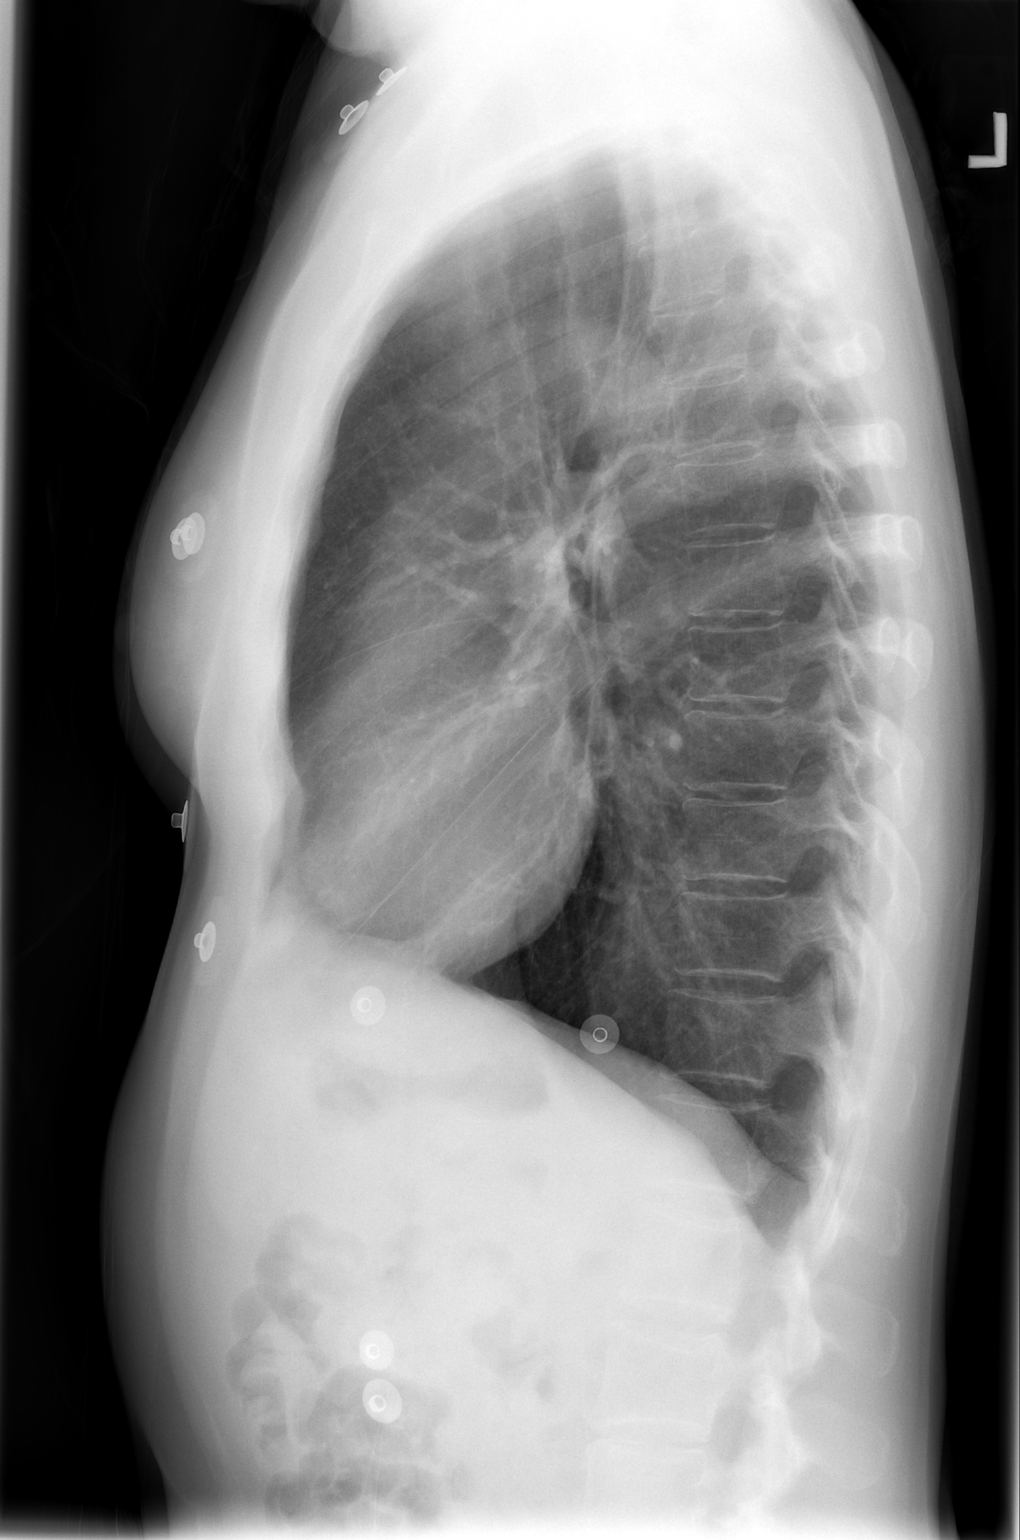

[2 of 2 positions shown; findings below may reference images not displayed]

FINDINGS: The lungs are clear and slightly hyperaerated.  The heart
is within normal limits in size.  No bony abnormality is seen.
IMPRESSION: Hyperaeration consistent with COPD.  No active lung disease.

## 2009-10-07 ENCOUNTER — Encounter: Payer: Self-pay | Admitting: Internal Medicine

## 2010-02-24 ENCOUNTER — Ambulatory Visit (HOSPITAL_BASED_OUTPATIENT_CLINIC_OR_DEPARTMENT_OTHER): Admission: RE | Admit: 2010-02-24 | Discharge: 2010-02-24 | Payer: Self-pay | Admitting: Otolaryngology

## 2010-06-25 ENCOUNTER — Emergency Department (HOSPITAL_COMMUNITY): Admission: EM | Admit: 2010-06-25 | Discharge: 2010-06-25 | Payer: Self-pay | Admitting: Emergency Medicine

## 2010-07-02 ENCOUNTER — Encounter: Payer: Self-pay | Admitting: Internal Medicine

## 2010-09-22 ENCOUNTER — Encounter (INDEPENDENT_AMBULATORY_CARE_PROVIDER_SITE_OTHER): Payer: Self-pay | Admitting: *Deleted

## 2010-09-22 ENCOUNTER — Emergency Department (HOSPITAL_COMMUNITY): Admission: EM | Admit: 2010-09-22 | Discharge: 2010-09-22 | Payer: Self-pay | Admitting: Emergency Medicine

## 2010-09-22 IMAGING — CR DG CHEST 2V
2 series · 2 of 2 positions shown · non-contrast
Comparison: [DATE]

CLINICAL DATA: Epigastric pain with vomiting for 3 days.

CHEST - 2 VIEW

[w chest pa]
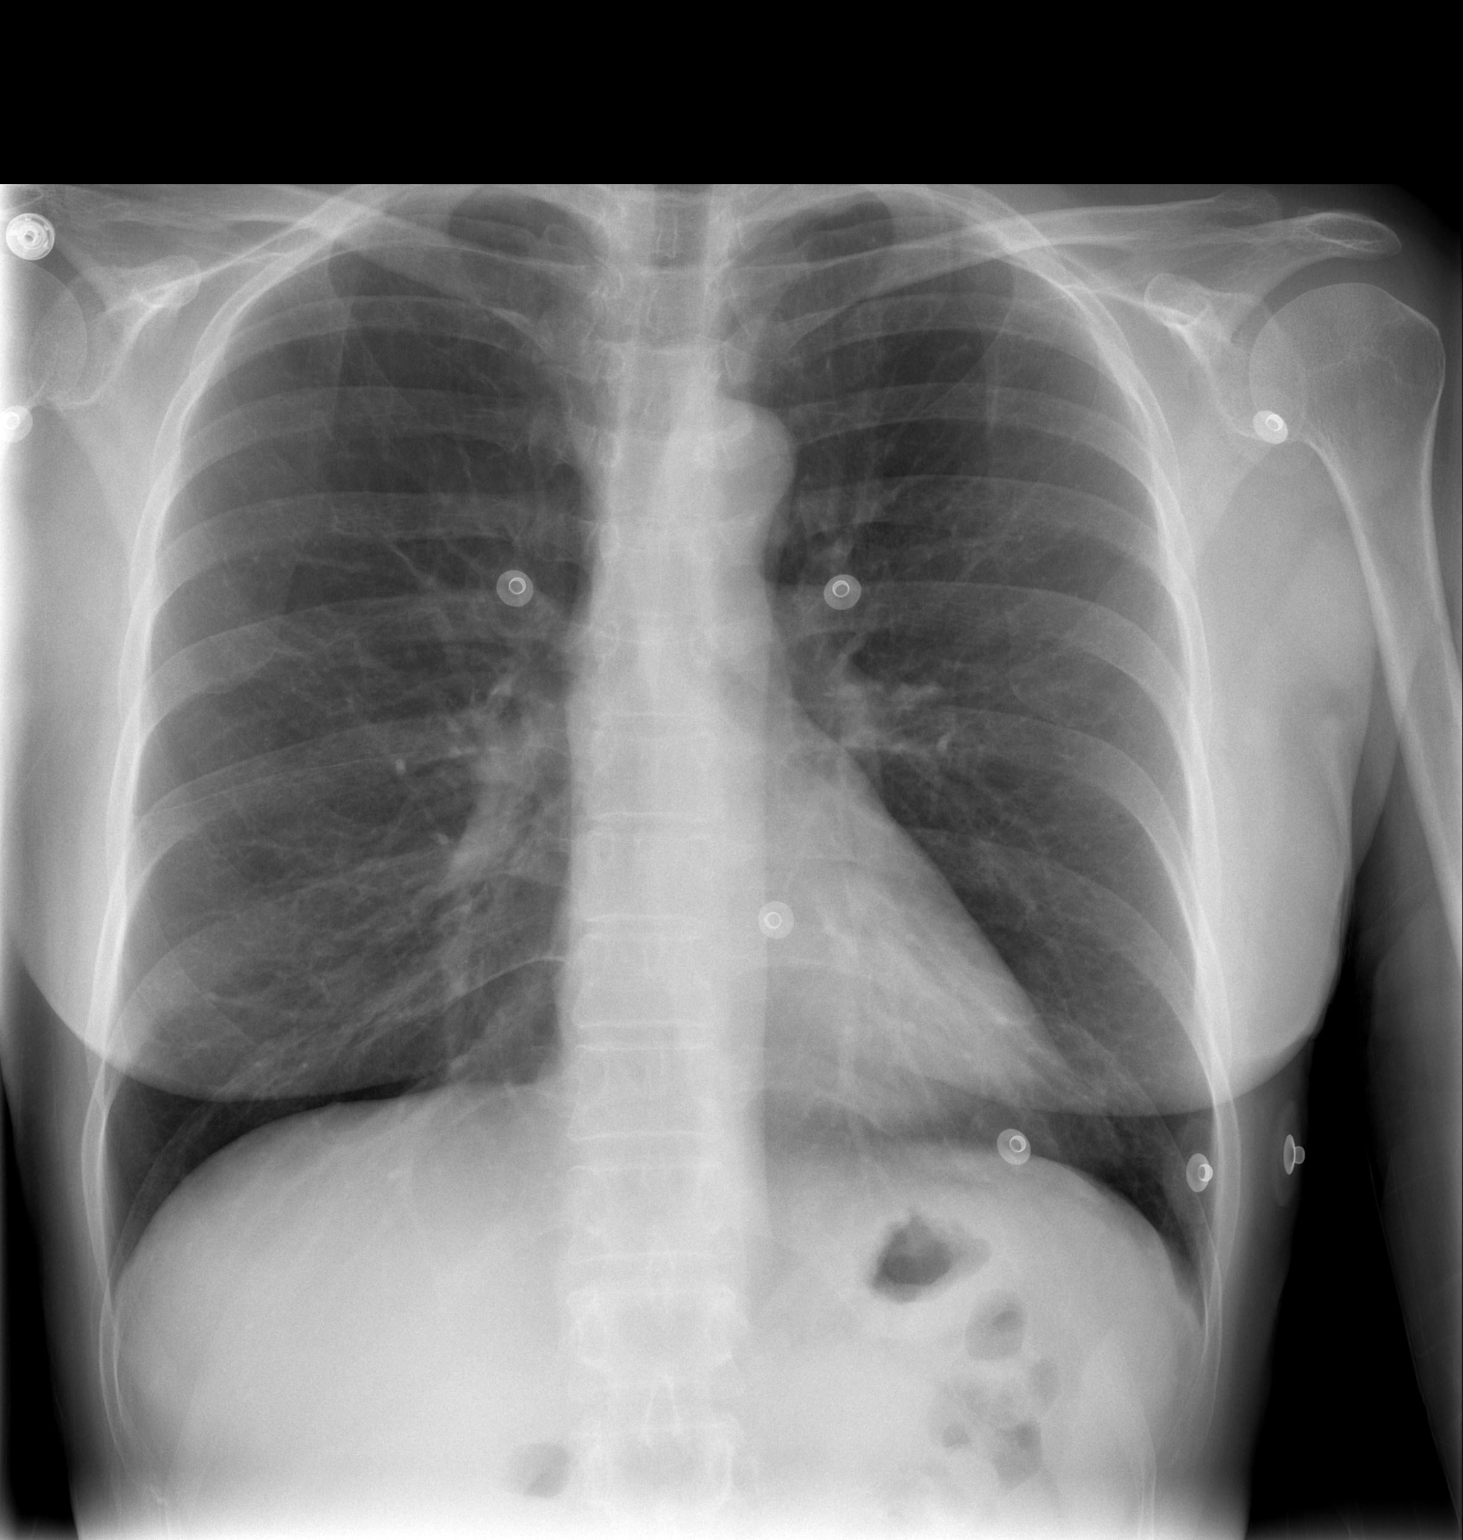

[w chest lat]
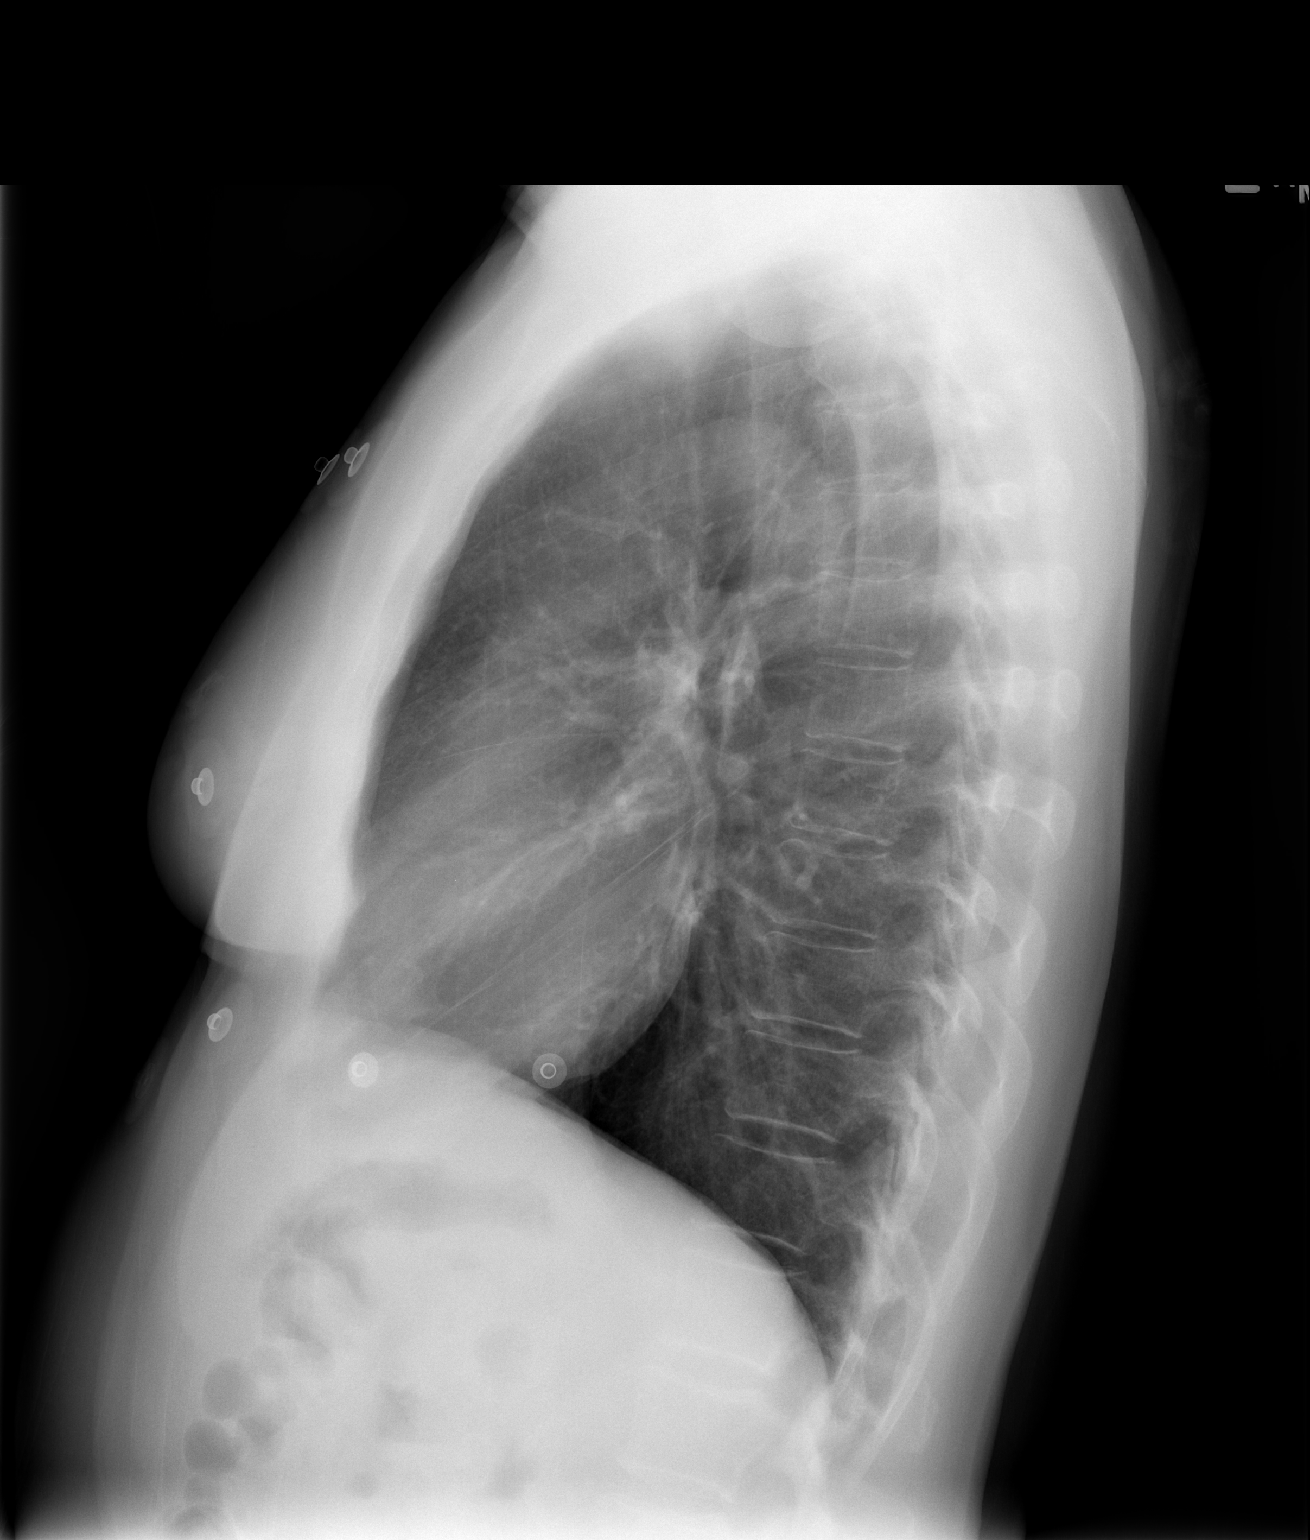

[2 of 2 positions shown; findings below may reference images not displayed]

FINDINGS: Midline trachea.  Normal heart size and mediastinal
contours. No pleural effusion or pneumothorax.  Clear lungs.

No free intraperitoneal air.
IMPRESSION: No acute cardiopulmonary disease.

## 2010-09-22 IMAGING — US US ABDOMEN COMPLETE
1 series · 14 of 25 positions shown · non-contrast
Comparison: CT of [DATE] and ultrasound of [DATE].

CLINICAL DATA: Epigastric abdominal pain.

COMPLETE ABDOMINAL ULTRASOUND

[Series 1: us abdomen complete · 0.28mm/px · 14 of 78 slices shown]
[im 1/78]
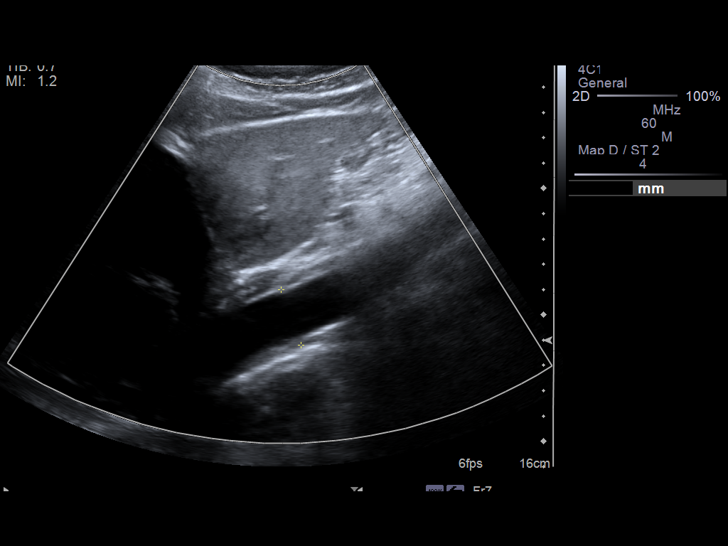
[im 7/78]
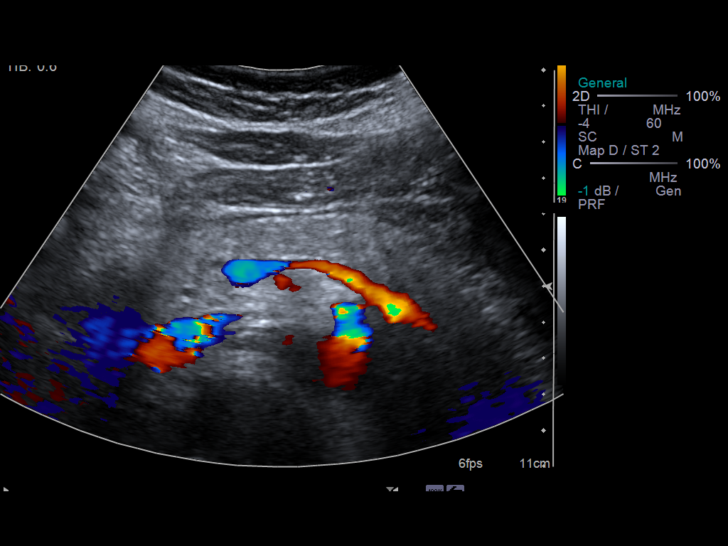
[im 13/78]
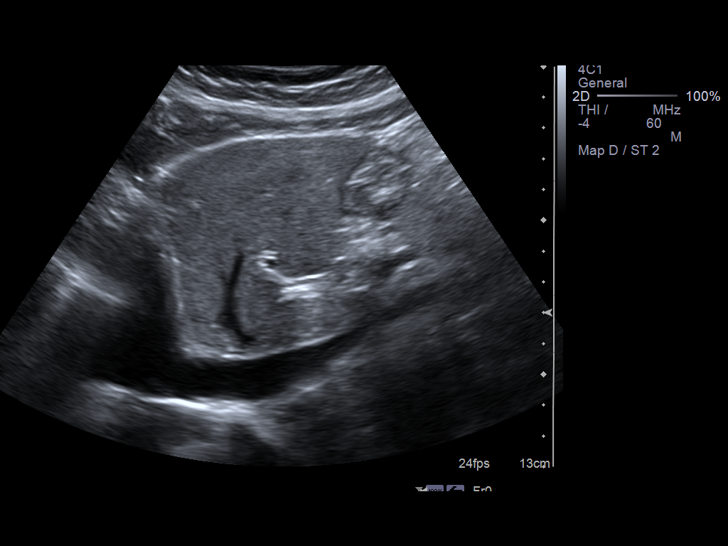
[im 20/78]
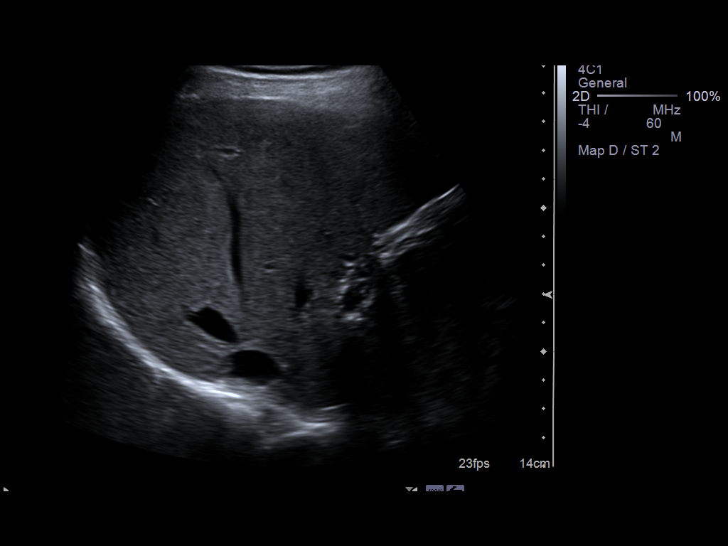
[im 26/78]
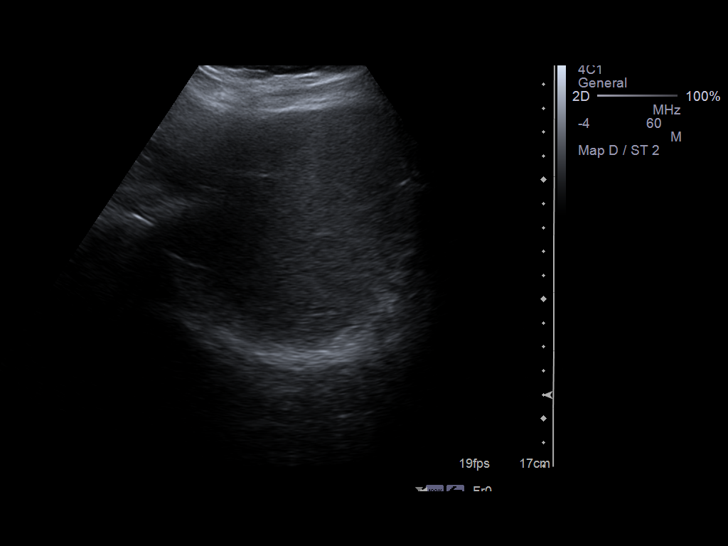
[im 29/78]
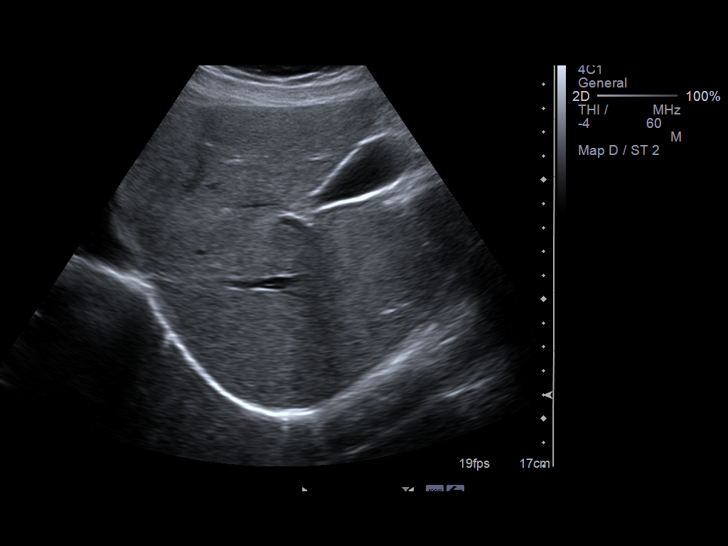
[im 36/78]
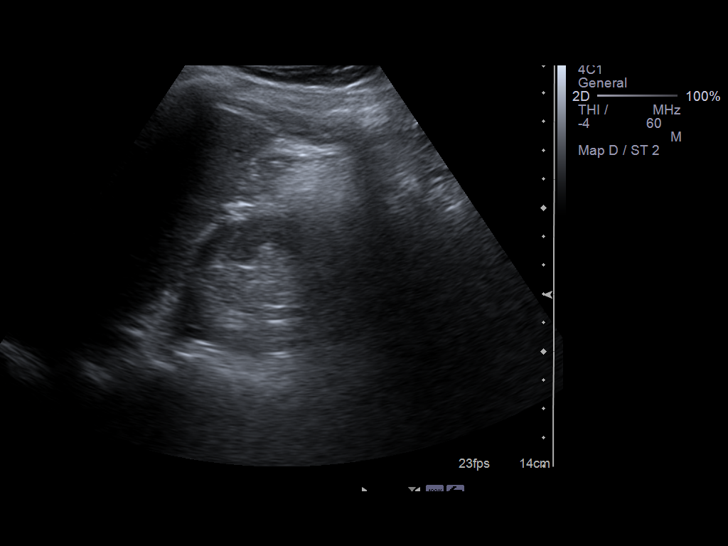
[im 42/78]
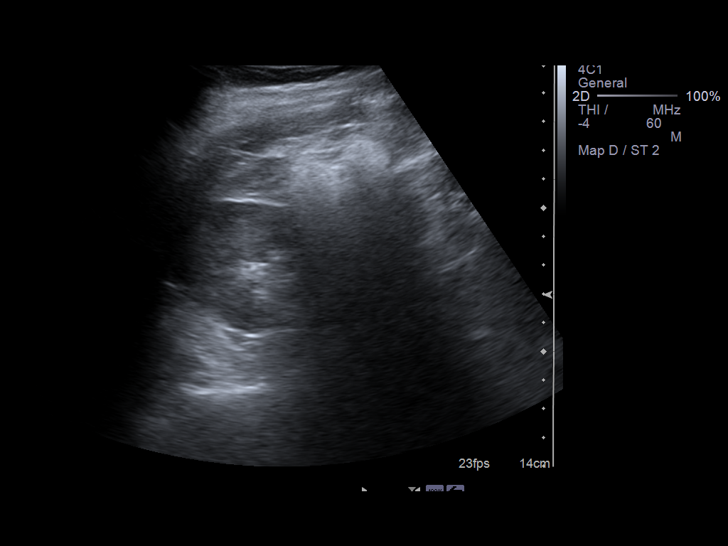
[im 49/78]
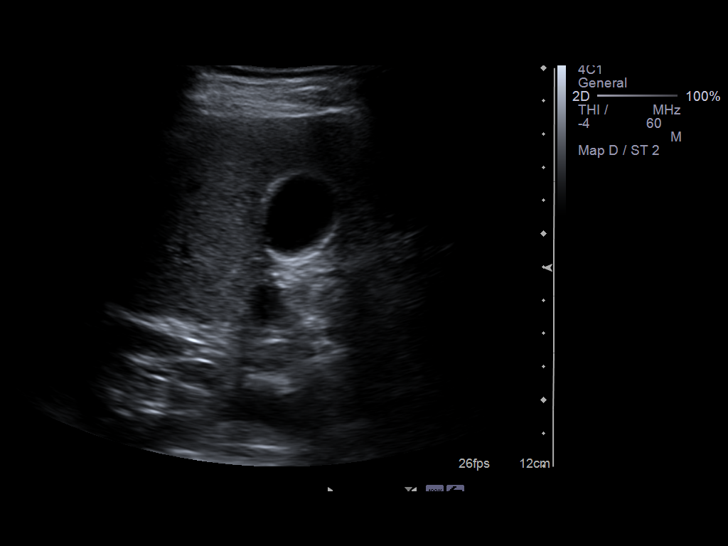
[im 52/78]
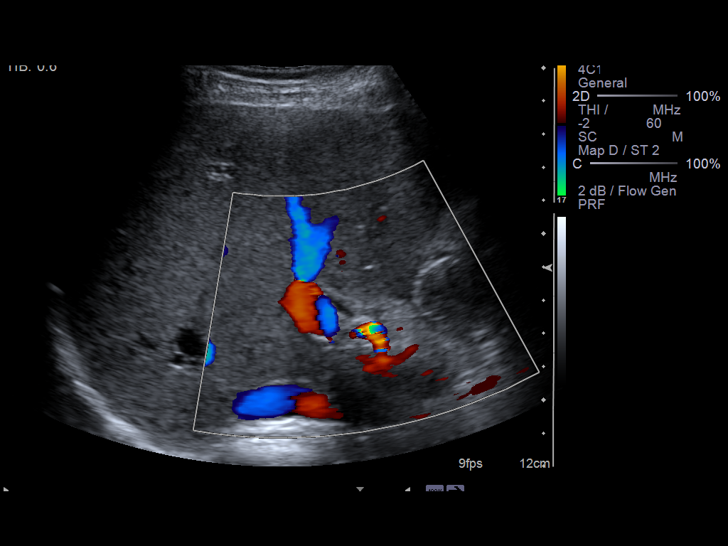
[im 58/78]
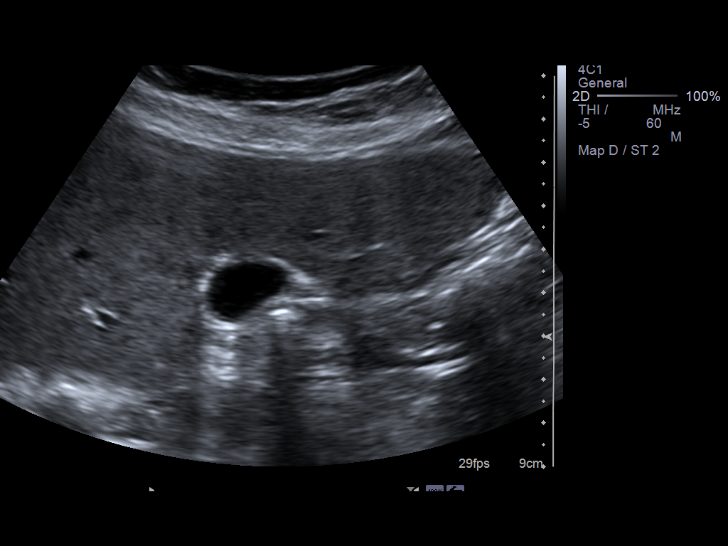
[im 65/78]
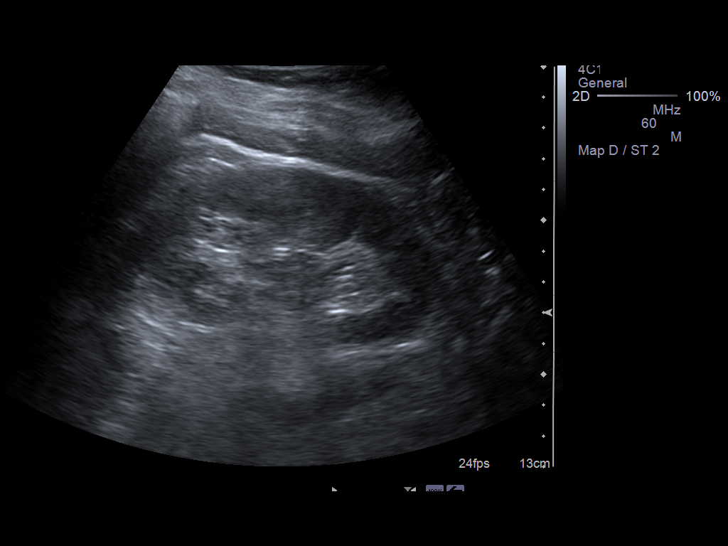
[im 71/78]
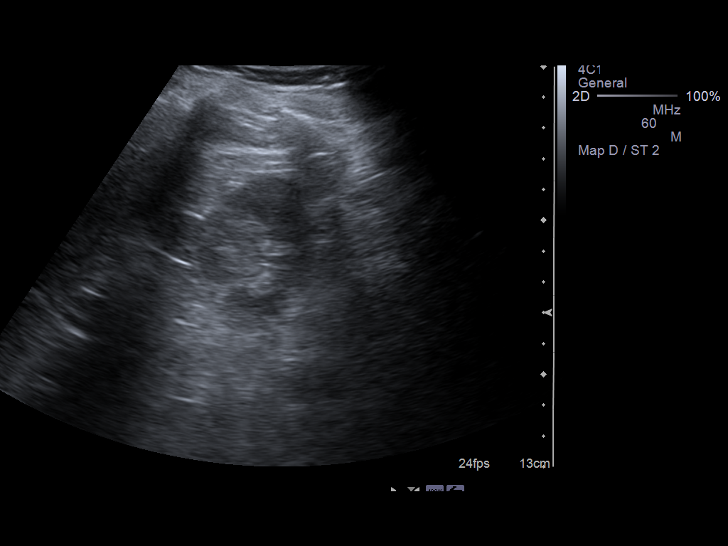
[im 78/78]
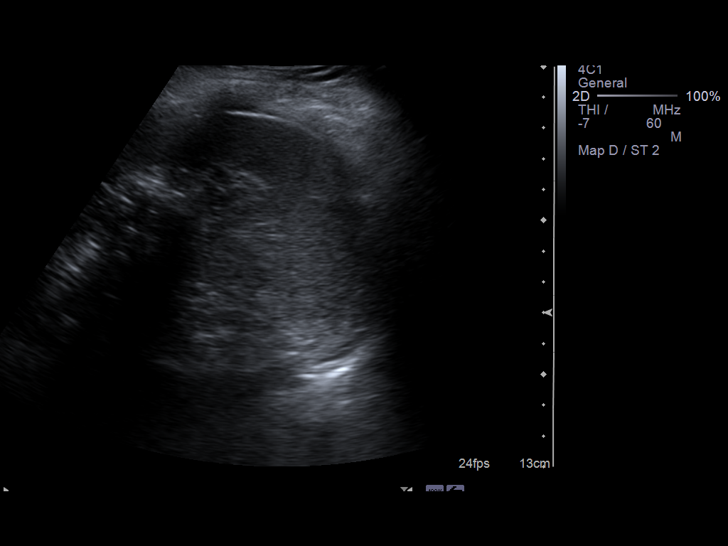

[14 of 25 positions shown; findings below may reference images not displayed]

FINDINGS: Gallbladder:  Normal, without wall thickening, stone, or
pericholecystic fluid.  Sonographic Murphy's sign was not elicited.

Common bile duct: Normal, at 5 mm.

Liver: Normal in echogenicity, without focal lesion.

IVC: Negative

Pancreas:  Negative

Spleen:  Normal in size and echogenicity.

Right Kidney:  9.9 cm. No hydronephrosis.

Left Kidney:  10.2 cm. No hydronephrosis.

Abdominal aorta:  Nonaneurysmal without ascites.
IMPRESSION: Normal abdominal ultrasound.

## 2010-09-25 ENCOUNTER — Telehealth: Payer: Self-pay | Admitting: Gastroenterology

## 2010-09-25 ENCOUNTER — Encounter: Payer: Self-pay | Admitting: Gastroenterology

## 2010-10-14 ENCOUNTER — Encounter: Payer: Self-pay | Admitting: Gastroenterology

## 2010-10-16 ENCOUNTER — Ambulatory Visit: Payer: Self-pay | Admitting: Gastroenterology

## 2010-10-16 DIAGNOSIS — R079 Chest pain, unspecified: Secondary | ICD-10-CM

## 2010-10-16 DIAGNOSIS — R112 Nausea with vomiting, unspecified: Secondary | ICD-10-CM

## 2010-10-16 DIAGNOSIS — R1013 Epigastric pain: Secondary | ICD-10-CM | POA: Insufficient documentation

## 2010-10-16 DIAGNOSIS — K219 Gastro-esophageal reflux disease without esophagitis: Secondary | ICD-10-CM | POA: Insufficient documentation

## 2010-10-16 HISTORY — DX: Chest pain, unspecified: R07.9

## 2010-10-16 HISTORY — DX: Nausea with vomiting, unspecified: R11.2

## 2010-10-16 HISTORY — DX: Epigastric pain: R10.13

## 2010-11-04 ENCOUNTER — Encounter: Payer: Self-pay | Admitting: Gastroenterology

## 2010-11-04 ENCOUNTER — Ambulatory Visit (HOSPITAL_COMMUNITY)
Admission: RE | Admit: 2010-11-04 | Discharge: 2010-11-04 | Payer: Self-pay | Source: Home / Self Care | Attending: Gastroenterology | Admitting: Gastroenterology

## 2010-11-04 IMAGING — NM NM HEPATO W/GB/PHARM/[PERSON_NAME]
1 series · 12 of 12 positions shown · non-contrast
Comparison: None.

CLINICAL DATA: Epigastric abdominal and chest pain

NUCLEAR MEDICINE HEPATOBILIARY IMAGING WITH GALLBLADDER EF
TECHNIQUE: Sequential images of the abdomen were obtained [DATE] minutes following intravenous administration of
radiopharmaceutical.  After slow intravenous infusion of
micrograms Cholecystokinin, gallbladder ejection fraction was
determined.
Radiopharmaceutical:  4.9 mCi [HK] Choletec

[Series 1: antr · 4.46mm/px · 2 acquisitions, 12 frames shown]
[im 1/2]
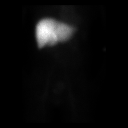
[im 1/2]
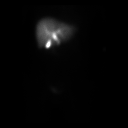
[im 1/2]
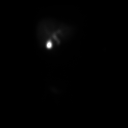
[im 1/2]
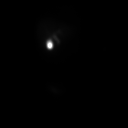
[im 1/2]
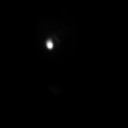
[im 1/2]
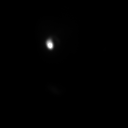
[im 2/2]
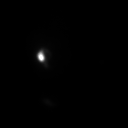
[im 2/2]
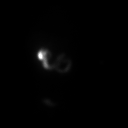
[im 2/2]
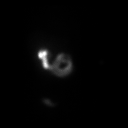
[im 2/2]
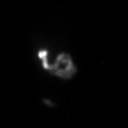
[im 2/2]
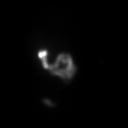
[im 2/2]
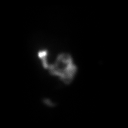

[12 of 12 positions shown; findings below may reference images not displayed]

FINDINGS: Following the intravenous administration of the
radiopharmaceutical there is uniform uptake by the liver with
clearance from blood pool.  Radiotracer accumulation within the
gallbladder is noted by 15 minutes.

Following CCK administration the gallbladder ejection fraction was
calculated to be 74.9%.

The patient did not experience symptoms during CCK infusion.
IMPRESSION: 1.  Patent cystic duct without evidence for acute cholecystitis.
2.  Normal gallbladder ejection fraction.

## 2010-11-13 ENCOUNTER — Telehealth: Payer: Self-pay | Admitting: Gastroenterology

## 2010-11-18 ENCOUNTER — Other Ambulatory Visit: Payer: Self-pay | Admitting: Gastroenterology

## 2010-11-18 ENCOUNTER — Ambulatory Visit
Admission: RE | Admit: 2010-11-18 | Discharge: 2010-11-18 | Payer: Self-pay | Source: Home / Self Care | Attending: Gastroenterology | Admitting: Gastroenterology

## 2010-11-18 DIAGNOSIS — D126 Benign neoplasm of colon, unspecified: Secondary | ICD-10-CM

## 2010-11-25 ENCOUNTER — Encounter: Payer: Self-pay | Admitting: Gastroenterology

## 2010-11-25 NOTE — Letter (Signed)
Summary: [facility]   Imported By: Sherian Rein 07/12/2010 11:44:51  _____________________________________________________________________  External Attachment:    Type:   Image     Comment:   External Document

## 2010-11-25 NOTE — Letter (Signed)
Summary: New Patient letter  [facility]  [address] [place], [place] [address]   Phone: [phone]  Fax: [phone]       09/25/2010 MRN: [id number]  [facility] [address] [place], [place]  [address]  Dear Ms. Killian,  Welcome to the Gastroenterology Division at [facility].    You are scheduled to see Dr.  Russella Dar on 10/16/10 at 10:15 a.m.  on the 3rd floor at [facility], [address]  We ask that you try to arrive at our office 15 minutes prior to your appointment time to allow for check-in.  We would like you to complete the enclosed self-administered evaluation form prior to your visit and bring it with you on the day of your appointment.  We will review it with you.  Also, please bring a complete list of all your medications or, if you prefer, bring the medication bottles and we will list them.  Please bring your insurance card so that we may make a copy of it.  If your insurance requires a referral to see a specialist, please bring your referral form from your primary care physician.  Co-payments are due at the time of your visit and may be paid by cash, check or credit card.     Your office visit will consist of a consult with your physician (includes a physical exam), any laboratory testing he/she may order, scheduling of any necessary diagnostic testing (e.g. x-ray, ultrasound, CT-scan), and scheduling of a procedure (e.g. Endoscopy, Colonoscopy) if required.  Please allow enough time on your schedule to allow for any/all of these possibilities.    If you cannot keep your appointment, please call [phone] to cancel or reschedule prior to your appointment date.  This allows [place] the opportunity to schedule an appointment for another patient in need of care.  If you do not cancel or reschedule by 5 p.m. the business day prior to your appointment date, you will be charged a $50.00 late cancellation/no-show fee.    Thank you for  choosing [facility] for your medical needs.  We appreciate the opportunity to care for you.  Please visit [place] at our website  to learn more about our practice.                     Sincerely,                                                             The Gastroenterology Division

## 2010-11-25 NOTE — Progress Notes (Signed)
Summary: triage  Phone Note From Other Clinic Call back at [phone]   Caller: Malachi Bonds, scheduler Call For: Dr. Russella Dar Reason for Call: Schedule Patient Appt Summary of Call: Dr. Evlyn Kanner would like pt seen for GERD... recent ER visit for GERD per Malachi Bonds... Malachi Bonds states this needs to be triaged Initial call taken by: Vallarie Mare,  September 25, 2010 4:21 PM  Follow-up for Phone Call        Patient  was seen in the ER for CP all GI and cardiac testing has been negative per Malachi Bonds.  Patient is scheduled to see Dr Russella Dar 12/22/1.  I have sent new patient paperwork. Follow-up by: Darcey Nora RN, CGRN,  September 25, 2010 4:35 PM

## 2010-11-27 NOTE — Progress Notes (Signed)
[facility]   Imported By: Sherian Rein 10/17/2010 07:18:36  _____________________________________________________________________  External Attachment:    Type:   Image     Comment:   External Document

## 2010-11-27 NOTE — Procedures (Addendum)
Summary: Upper Endoscopy  Patient: Melanie Caldwell Note: All result statuses are Final unless otherwise noted.  Tests: (1) Upper Endoscopy (EGD)   EGD Upper Endoscopy       DONE     [facility]     [address]     [place], [place]  [address]           ENDOSCOPY PROCEDURE REPORT           PATIENT:  Melanie, Caldwell  MR#:  [id number]     BIRTHDATE:  1956-10-18, 54 yrs. old  GENDER:  female     ENDOSCOPIST:  Judie Petit T. Russella Dar, MD, [facility]           PROCEDURE DATE:  11/18/2010     PROCEDURE:  EGD, diagnostic 43235     ASA CLASS:  Class II     INDICATIONS:  epigastric pain, chest pain, GERD, N/V     MEDICATIONS:  There was residual sedation effect present from     prior procedure, Versed 3 mg IV     TOPICAL ANESTHETIC:  Exactacain Spray     DESCRIPTION OF PROCEDURE:   After the risks benefits and     alternatives of the procedure were thoroughly explained, informed     consent was obtained.  The LB GIF-H180 [id number] endoscope was     introduced through the mouth and advanced to the pylorus, limited     by an obstruction.   The instrument was slowly withdrawn as the     mucosa was fully examined.     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 The endoscope could not pass the prepylorci area.     Otherwise normal stomach. The esophagus and gastroesophageal     junction were completely normal in appearance. Retroflexed views     revealed no abnormalities. The scope was then withdrawn from the     patient and the procedure completed.           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 Russella Dar, MD, [facility]           CC:  Adrian Prince,  MD           n.     Rosalie DoctorVenita Lick. Justis Closser at 11/18/2010 02:47 PM           [name], [phone]  Note: An exclamation mark (!) indicates a result that was not dispersed into the flowsheet. Document Creation Date: 11/18/2010 2:48 PM _______________________________________________________________________  (1) Order result status: Final Collection or observation date-time: 11/18/2010 14:41 Requested date-time:  Receipt date-time:  Reported date-time:  Referring Physician:   Ordering Physician: Claudette Head [phone]) Specimen Source:  Source: Launa Grill Order Number: [id number] Lab site:

## 2010-11-27 NOTE — Progress Notes (Signed)
Summary: prep was sent to the wrong pharmacy  Phone Note Call from Patient Call back at Home Phone [phone]   Caller: Patient Call For: Dr Russella Dar Reason for Call: Talk to Nurse Summary of Call: Patient states that her rx was sentt o the wrong pharmacy, please resend it to [facility] Initial call taken by: Tawni Levy,  November 13, 2010 2:48 PM  Follow-up for Phone Call        Rx was sent to pts pharmacy.  Follow-up by: Christie Nottingham CMA Duncan Dull),  November 13, 2010 3:05 PM    Prescriptions: MOVIPREP 100 GM  SOLR (PEG-KCL-NACL-NASULF-NA ASC-C) As per prep instructions.  #1 x 0   Entered by:   Christie Nottingham CMA (AAMA)   Authorized by:   Meryl Dare MD [facility]   Signed by:   Christie Nottingham CMA (AAMA) on 11/13/2010   Method used:   Electronically to        [facility]* (retail)       [address] [place], [place]  [address]       Ph: [phone] or [phone]       Fax: [phone]   RxID:   [id number]

## 2010-11-27 NOTE — Letter (Signed)
Summary: Results Letter  [facility]  [address] [place], [place] [address]   Phone: [phone]  Fax: [phone]        November 04, 2010 MRN: [id number]    [facility] [address] [place], [place]  [address]     Dear Melanie Caldwell,     We have called you multiple times and unfortunatley, we have been unable to reach you regarding your results of your Hida Scan. The results of your recent study were negative and does not show any sign of gallbladder dysfunction. If you have any questions or concerns, please call our office at [phone].       Sincerely,  Christie Nottingham CMA [facility])  This letter has been electronically signed by your physician.

## 2010-11-27 NOTE — Letter (Signed)
Summary: [facility]  [facility]   Imported By: Lester Fulshear 10/23/2010 12:09:31  _____________________________________________________________________  External Attachment:    Type:   Image     Comment:   External Document

## 2010-11-27 NOTE — Procedures (Signed)
Summary: EGD   EGD  Procedure date:  09/07/2005  Findings:      Findings: Esophagitis  Findings: Gastritis  Location: [facility]   Patient Name: Melanie Caldwell, Fasig MRN:  Procedure Procedures: Panendoscopy (EGD) CPT: 43235.    with biopsy(s)/brushing(s). CPT: D1846139.  Personnel: Endoscopist: Venita Lick. Russella Dar, MD, [facility].  Referred By: Thomos Lemons, D.O.  Exam Location: Exam performed in Outpatient Clinic. Outpatient  Patient Consent: Procedure, Alternatives, Risks and Benefits discussed, consent obtained, from patient. Consent was obtained by the RN.  Indications Symptoms: Vomiting. Reflux symptoms  History  Current Medications: Patient is not currently taking Coumadin.  Pre-Exam Physical: Performed Sep 07, 2005  Cardio-pulmonary exam, HEENT exam, Abdominal exam, Mental status exam WNL.  Exam Exam Info: Maximum depth of insertion Duodenum, intended Duodenum. Vocal cords not visualized. Gastric retroflexion performed. ASA Classification: II. Tolerance: excellent.  Sedation Meds: Patient assessed and found to be appropriate for moderate (conscious) sedation. Fentanyl 50 mcg. given IV. Versed 6 mg. given IV. Cetacaine Spray 2 sprays given aerosolized.  Monitoring: BP and pulse monitoring done. Oximetry used. Supplemental O2 given  Findings Normal: Proximal Esophagus to Mid Esophagus.  ESOPHAGEAL INFLAMMATION: Severity is mild, erythema only.  [place] Classification: Grade 0. ICD9: Esophagitis, Reflux: 530.11.  Normal: Cardia to Body.  Normal: Duodenal Bulb to Duodenal 2nd Portion.  MUCOSAL ABNORMALITY: Antrum to Pyloric Sphincter. Erythematous mucosa. RUT done, results pending. ICD9: Gastritis, Unspecified: 535.50. Comment: prepyloric area is mildly deformed.   Assessment  Diagnoses: 530.11: Esophagitis, Reflux.  535.50: Gastritis, Unspecified.   Events  Unplanned Intervention: No unplanned interventions were required.  Unplanned Events: There  were no complications. Plans Medication(s): Await pathology. PPI: QAM, for indefinitely.   Patient Education: Patient given standard instructions for: Reflux. Mucosal Abnormality.  Disposition: After procedure patient sent to recovery. After recovery patient sent home.  Scheduling: Office Visit, to [employer]. Russella Dar, MD, [facility], prn  Primary Care Anvay Tennis, to Thomos Lemons, D.O., as scheduled    This report was created from the original endoscopy report, which was reviewed and signed by the above listed endoscopist.    cc: Thomos Lemons, DO    RUT: Negative

## 2010-11-27 NOTE — Assessment & Plan Note (Signed)
Summary: GERD/sheri   History of Present Illness Visit Type: consult Primary GI MD: Elie Goody MD [facility] Primary Deoni Cosey: Adrian Prince, MD Chief Complaint: GERD , excessive vomiting 4 weeks ago [facility] visit) History of Present Illness:   [name] relates occasional episodes of nausea, vomiting, epigastric pain, and chest pain. She was seen in the emergency department for 3 days fo the above symptoms in late November. Evaluation, including blood work, and abdominal ultrasound were unremarkable. She feels that her symptoms are often exacerbated by stress, and both her parents have chronic medical conditions and she cares for both of them.  She is maintained on Zegerid once daily for chronic GERD and MiraLax for chronic constipation. She states that her reflux symptoms are better controlled on twice a day Zegerid. She previously underwent upper endoscopy in November 2006 which showed mild GERD and mild gastritis. Rapid urease testing was negative.   GI Review of Systems    Reports acid reflux, chest pain, nausea, and  vomiting.      Denies abdominal pain, belching, bloating, dysphagia with liquids, dysphagia with solids, heartburn, loss of appetite, vomiting blood, weight loss, and  weight gain.        Denies anal fissure, black tarry stools, change in bowel habit, constipation, diarrhea, diverticulosis, fecal incontinence, heme positive stool, hemorrhoids, irritable bowel syndrome, jaundice, light color stool, liver problems, rectal bleeding, and  rectal pain.   Current Medications (verified): 1)  Celexa 40 Mg Tabs (Citalopram Hydrobromide) .... Take 1 Tablet By Mouth Two Times A Day 2)  Diazepam 10 Mg Tabs (Diazepam) .... Take 1 Tab By Mouth At Bedtime 3)  Trazodone Hcl 50 Mg Tabs (Trazodone Hcl) .Marland Kitchen.. 1 1/2 Tablet At Bedtime 4)  Miralax  Powd (Polyethylene Glycol 3350) .Marland KitchenMarland KitchenMarland Kitchen 17 Gm By Mouth Two Times A Day As Needed 5)  Zegerid 40-1100 Mg Caps (Omeprazole-Sodium Bicarbonate) ....  One By Mouth Once Daily 6)  Promethazine Hcl 25 Mg Tabs (Promethazine Hcl) .... One Tablet Every 6 Hours As Needed 7)  Hydrocodone-Acetaminophen 5-325 Mg Tabs (Hydrocodone-Acetaminophen) .Marland Kitchen.. 1-2 Tablets By Mouth Every 4-6 Hours As Needed Pain 8)  Vesicare 10 Mg Tabs (Solifenacin Succinate) .... At Bedtime 9)  Trimethoprim 100 Mg Tabs (Trimethoprim) .... At Bedtime 10)  Adderall 15 Mg Tabs (Amphetamine-Dextroamphetamine) .... Two Times A Day  Allergies (verified): 1)  ! Ceftin 2)  ! Codeine  Past History:  Past Medical History: Reviewed history from 10/14/2010 and no changes required. Depression GERD Hx of nephrolithiasis Hx of UTI's ADD  Hx of recreational drug use    Tobacco abuse Asthma Anxiety Disorder COPD Uterus fibroids  Past Surgical History: Lithotripsy 1985 Tonsillectomy    Middle ear exploration with revision of left stapedectomy and replacement of prosthesis Hernia Surgery  Family History: Reviewed history from 10/14/2010 and no changes required. CAD - M Htn - M    Family History of Diabetes: Father  Social History: Reviewed history from 02/12/2009 and no changes required. Single Unemployed  Current Smoker <1/2 ppd Hx of heavy Etoh use in the past     Review of Systems       The patient complains of back pain.         The pertinent positives and negatives are noted as above and in the HPI. All other ROS were reviewed and were negative.   Vital Signs:  Patient profile:   55 year old female Height:      59 inches Weight:      109  pounds BMI:     22.09 Pulse rate:   68 / minute Pulse rhythm:   regular BP sitting:   98 / 70  (left arm) Cuff size:   regular  Vitals Entered By: June McMurray CMA Duncan Dull) (October 16, 2010 10:32 AM)  Physical Exam  General:  Well developed, well nourished, no acute distress. Head:  Normocephalic and atraumatic. Eyes:  PERRLA, no icterus. Mouth:  No deformity or lesions, dentition normal. Chest Wall:   Symmetrical;  no deformities or tenderness. Lungs:  Clear throughout to auscultation. Heart:  Regular rate and rhythm; no murmurs, rubs,  or bruits. Abdomen:  Soft, nontender and nondistended. No masses, hepatosplenomegaly or hernias noted. Normal bowel sounds. Psych:  Alert and cooperative. Normal mood and affect.  Impression & Recommendations:  Problem # 1:  NAUSEA AND VOMITING (ICD-787.01) Episodic nausea, vomiting, chest pain, and epigastric pain. Rule out acalculous cholecystitis, GERD flare and anxiety. Increase Zegerid to 40 mg twice daily and intensify all antireflux measures. Schedule [facility] HIDA.  Problem # 2:  GERD (ICD-530.81) See above.  Problem # 3:  ABDOMINAL PAIN, EPIGASTRIC (ICD-789.06) See above. The risks, benefits and alternatives to endoscopy with possible biopsy and possible dilation were discussed with the patient and they consent to proceed. The procedure will be scheduled electively. Orders: HIDA Scan (HIDA SCAN) Colon/Endo (Colon/Endo)  Problem # 4:  SPECIAL SCREENING FOR MALIGNANT NEOPLASMS COLON (ICD-V76.51) Average risk for colorectal cancer. She's not previously had colon cancer screening with colonoscopy. The risks, benefits and alternatives to colonoscopy with possible biopsy and possible polypectomy were discussed with the patient and they consent to proceed. The procedure will be scheduled electively. Orders: Colon/Endo (Colon/Endo)  Patient Instructions: 1)  Colonoscopy brochure given.  2)  Upper Endoscopy brochure given.  3)  You have been scheduled for a HIDA Scan and instructions given.  4)  Avoid foods high in acid content ( tomatoes, citrus juices, spicy foods) . Avoid eating within 3 to 4 hours of lying down or before exercising. Do not over eat; try smaller more frequent meals. Elevate head of bed four inches when sleeping.  5)  Your prescription for Zegerid has been changed to two times a day and a prescription has been sent to your pharmacy.  6)   Copy sent to : Adrian Prince, MD 7)  The medication list was reviewed and reconciled.  All changed / newly prescribed medications were explained.  A complete medication list was provided to the patient / caregiver.  Prescriptions: MOVIPREP 100 GM  SOLR (PEG-KCL-NACL-NASULF-NA ASC-C) As per prep instructions.  #1 x 0   Entered by:   Christie Nottingham CMA (AAMA)   Authorized by:   Meryl Dare MD [facility]   Signed by:   Christie Nottingham CMA (AAMA) on 10/16/2010   Method used:   Electronically to        [facility] [phone]* (retail)       [address]       [place], [place]  [address]       Ph: [phone]       Fax: [phone]   RxID:   [id number] ZEGERID 40-1100 MG CAPS (OMEPRAZOLE-SODIUM BICARBONATE) one by mouth two times a day  #68 x 11   Entered by:   Christie Nottingham CMA (AAMA)   Authorized by:   Meryl Dare MD [facility]   Signed by:   Christie Nottingham CMA (AAMA) on 10/16/2010   Method used:   Electronically to  [facility]  [address] [phone]* (retail)       [address]       [place], [place]  [address]       Ph: [phone]       Fax: [phone]   RxID:   [id number]

## 2010-11-27 NOTE — Letter (Signed)
Summary: [facility] Instructions  [facility]  [address] [place], [place] [address]   Phone: [phone]  Fax: [phone]       Melanie Caldwell    1956-10-19    MRN: [id number]        Procedure Day /Date: Tuesday January 24th, 2012     Arrival Time: 1:00pm     Procedure Time: 2:00pm     Location of Procedure:                    _ x_  [facility] (4th Floor)                        PREPARATION FOR COLONOSCOPY WITH MOVIPREP   Starting 5 days prior to your procedure 11/13/10 do not eat nuts, seeds, popcorn, corn, beans, peas,  salads, or any raw vegetables.  Do not take any fiber supplements (e.g. Metamucil, Citrucel, and Benefiber).  THE DAY BEFORE YOUR PROCEDURE         DATE: 11/17/10  DAY: Monday  1.  Drink clear liquids the entire day-NO SOLID FOOD  2.  Do not drink anything colored red or purple.  Avoid juices with pulp.  No orange juice.  3.  Drink at least 64 oz. (8 glasses) of fluid/clear liquids during the day to prevent dehydration and help the prep work efficiently.  CLEAR LIQUIDS INCLUDE: Water Jello Ice Popsicles Tea (sugar ok, no milk/cream) Powdered fruit flavored drinks Coffee (sugar ok, no milk/cream) Gatorade Juice: apple, white grape, white cranberry  Lemonade Clear bullion, consomm, broth Carbonated beverages (any kind) Strained chicken noodle soup Hard Candy                             4.  In the morning, mix first dose of MoviPrep solution:    Empty 1 Pouch A and 1 Pouch B into the disposable container    Add lukewarm drinking water to the top line of the container. Mix to dissolve    Refrigerate (mixed solution should be used within 24 hrs)  5.  Begin drinking the prep at 5:00 p.m. The MoviPrep container is divided by 4 marks.   Every 15 minutes drink the solution down to the next mark (approximately 8 oz) until the full liter is complete.   6.  Follow completed prep with 16 oz of clear liquid of your choice  (Nothing red or purple).  Continue to drink clear liquids until bedtime.  7.  Before going to bed, mix second dose of MoviPrep solution:    Empty 1 Pouch A and 1 Pouch B into the disposable container    Add lukewarm drinking water to the top line of the container. Mix to dissolve    Refrigerate  THE DAY OF YOUR PROCEDURE      DATE: 11/18/10 DAY: Tuesday  Beginning at 9:00 a.m. (5 hours before procedure):         1. Every 15 minutes, drink the solution down to the next mark (approx 8 oz) until the full liter is complete.  2. Follow completed prep with 16 oz. of clear liquid of your choice.    3. You may drink clear liquids until 12:00pm (2 HOURS BEFORE PROCEDURE).   MEDICATION INSTRUCTIONS  Unless otherwise instructed, you should take regular prescription medications with a small sip of water   as early as possible the morning of your  procedure.         OTHER INSTRUCTIONS  You will need a responsible adult at least 55 years of age to accompany you and drive you home.   This person must remain in the waiting room during your procedure.  Wear loose fitting clothing that is easily removed.  Leave jewelry and other valuables at home.  However, you may wish to bring a book to read or  an iPod/MP3 player to listen to music as you wait for your procedure to start.  Remove all body piercing jewelry and leave at home.  Total time from sign-in until discharge is approximately 2-3 hours.  You should go home directly after your procedure and rest.  You can resume normal activities the  day after your procedure.  The day of your procedure you should not:   Drive   Make legal decisions   Operate machinery   Drink alcohol   Return to work  You will receive specific instructions about eating, activities and medications before you leave.    The above instructions have been reviewed and explained to me by   Marchelle Folks.    I fully understand and can verbalize these  instructions _____________________________ Date _________

## 2010-11-27 NOTE — Procedures (Addendum)
Summary: Colonoscopy  Patient: Melanie Caldwell Note: All result statuses are Final unless otherwise noted.  Tests: (1) Colonoscopy (COL)   COL Colonoscopy           DONE     [facility]     [address]     [place], [place]  [address]           COLONOSCOPY PROCEDURE REPORT           PATIENT:  Melanie, Caldwell  MR#:  [id number]     BIRTHDATE:  1955-11-28, 54 yrs. old  GENDER:  female     ENDOSCOPIST:  Judie Petit T. Russella Dar, MD, [facility]           PROCEDURE DATE:  11/18/2010     PROCEDURE:  Colonoscopy with snare polypectomy     ASA CLASS:  Class II     INDICATIONS:  1) Routine Risk Screening     MEDICATIONS:   Fentanyl 150 mcg IV, Versed 12 mg IV, Benadryl 50     mg IV     DESCRIPTION OF PROCEDURE:   After the risks benefits and     alternatives of the procedure were thoroughly explained, informed     consent was obtained.  Digital rectal exam was performed and     revealed no abnormalities.   The LB [id number] endoscope     was introduced through the anus and advanced to the cecum, which     was identified by both the appendix and ileocecal valve, without     limitations.  The quality of the prep was excellent, using     MoviPrep.  The instrument was then slowly withdrawn as the colon     was fully examined.           <<PROCEDUREIMAGES>>           FINDINGS:  A sessile polyp was found in the sigmoid colon. It was     5 mm in size. Polyp was snared without cautery. Retrieval was     successful.   Mild diverticulosis was found in the sigmoid colon.     A normal appearing cecum, ileocecal valve, and appendiceal orifice     were identified. The ascending, hepatic flexure, transverse,     splenic flexure, descending colon, and rectum appeared     unremarkable. Retroflexed views in the rectum revealed no     abnormalities. The time to cecum =  3  minutes. The scope was then     withdrawn (time =  10  min) from the patient and the procedure     completed.        COMPLICATIONS:  None           ENDOSCOPIC IMPRESSION:     1) 5 mm sessile polyp in the sigmoid colon     2) Mild diverticulosis in the sigmoid colon           RECOMMENDATIONS:     1) Await pathology results     2) High fiber diet with liberal fluid intake.     3) If the polyp is adenomatous (pre-cancerous), colonoscopy in 5     years. Otherwise follow colorectal cancer screening guidelines for     "routine risk" patients with colonoscopy in 10 years.           Venita Lick. Russella Dar, MD, [facility]           CC:  Adrian Prince, MD  n.     eSIGNED:   Keyshon Stein T. Stormey Wilborn at 11/18/2010 02:37 PM           [name], [phone]  Note: An exclamation mark (!) indicates a result that was not dispersed into the flowsheet. Document Creation Date: 11/18/2010 2:38 PM _______________________________________________________________________  (1) Order result status: Final Collection or observation date-time: 11/18/2010 14:33 Requested date-time:  Receipt date-time:  Reported date-time:  Referring Physician:   Ordering Physician: Claudette Head [phone]) Specimen Source:  Source: Launa Grill Order Number: [id number] Lab site:   Appended Document: Colonoscopy     Procedures Next Due Date:    Colonoscopy: 10/2020

## 2010-12-03 NOTE — Letter (Signed)
Summary: Patient Notice-Hyperplastic Polyps  [facility]  [address] [place], [place] [address]   Phone: [phone]  Fax: [phone]        November 25, 2010 MRN: [id number]    [facility] [address] [place], [place]  [address]    Dear Ms. Coombes,  I am pleased to inform you that the colon polyp(s) removed during your recent colonoscopy was (were) found to be hyperplastic. These types of polyps are NOT pre-cancerous.  It is my recommendation that you have a repeat colonoscopy examination in 10 years for routine colorectal cancer screening.  Should you develop new or worsening symptoms of abdominal pain, bowel habit changes or bleeding from the rectum or bowels, please schedule an evaluation with either your primary care physician or with me.  Continue treatment plan as outlined the day of your exam.  Please call us if you are having persistent problems or have questions about your condition that have not been fully answered at this time.  Sincerely,  Meryl Dare MD [facility]  This letter has been electronically signed by your physician.  Appended Document: Patient Notice-Hyperplastic Polyps LETTER MAILED

## 2011-01-06 LAB — URINALYSIS, ROUTINE W REFLEX MICROSCOPIC
Bilirubin Urine: NEGATIVE
Glucose, UA: NEGATIVE mg/dL
Hgb urine dipstick: NEGATIVE
Ketones, ur: NEGATIVE mg/dL
Nitrite: NEGATIVE
Protein, ur: NEGATIVE mg/dL
Specific Gravity, Urine: 1.007 (ref 1.005–1.030)
Urobilinogen, UA: 0.2 mg/dL (ref 0.0–1.0)
pH: 7 (ref 5.0–8.0)

## 2011-01-06 LAB — DIFFERENTIAL
Basophils Absolute: 0.1 10*3/uL (ref 0.0–0.1)
Basophils Relative: 1 % (ref 0–1)
Eosinophils Absolute: 0.1 10*3/uL (ref 0.0–0.7)
Eosinophils Relative: 1 % (ref 0–5)
Lymphocytes Relative: 39 % (ref 12–46)
Lymphs Abs: 3.6 10*3/uL (ref 0.7–4.0)
Monocytes Absolute: 0.7 10*3/uL (ref 0.1–1.0)
Monocytes Relative: 7 % (ref 3–12)
Neutro Abs: 4.9 10*3/uL (ref 1.7–7.7)
Neutrophils Relative %: 53 % (ref 43–77)

## 2011-01-06 LAB — COMPREHENSIVE METABOLIC PANEL
ALT: 20 U/L (ref 0–35)
AST: 19 U/L (ref 0–37)
Albumin: 4.1 g/dL (ref 3.5–5.2)
Alkaline Phosphatase: 68 U/L (ref 39–117)
BUN: 7 mg/dL (ref 6–23)
CO2: 29 mEq/L (ref 19–32)
Calcium: 9.6 mg/dL (ref 8.4–10.5)
Chloride: 101 mEq/L (ref 96–112)
Creatinine, Ser: 0.59 mg/dL (ref 0.4–1.2)
GFR calc Af Amer: >60 mL/min (ref >60)
GFR calc non Af Amer: >60 mL/min (ref >60)
Glucose, Bld: 103 mg/dL — ABNORMAL HIGH (ref 70–99)
Potassium: 3.6 mEq/L (ref 3.5–5.1)
Sodium: 136 mEq/L (ref 135–145)
Total Bilirubin: 0.3 mg/dL (ref 0.3–1.2)
Total Protein: 7.3 g/dL (ref 6.0–8.3)

## 2011-01-06 LAB — LIPASE, BLOOD: Lipase: 24 U/L (ref 11–59)

## 2011-01-06 LAB — CBC
HCT: 41.7 % (ref 36.0–46.0)
Hemoglobin: 14.1 g/dL (ref 12.0–15.0)
MCH: 31.8 pg (ref 26.0–34.0)
MCHC: 33.8 g/dL (ref 30.0–36.0)
MCV: 93.9 fL (ref 78.0–100.0)
Platelets: 247 10*3/uL (ref 150–400)
RBC: 4.44 MIL/uL (ref 3.87–5.11)
RDW: 13.1 % (ref 11.5–15.5)
WBC: 9.3 10*3/uL (ref 4.0–10.5)

## 2011-01-06 LAB — POCT CARDIAC MARKERS
CKMB, poc: <1 ng/mL — ABNORMAL LOW (ref 1.0–8.0)
Myoglobin, poc: 43.4 ng/mL (ref 12–200)
Troponin i, poc: <0.05 ng/mL (ref 0.00–0.09)

## 2011-02-04 LAB — COMPREHENSIVE METABOLIC PANEL
AST: 39 U/L — ABNORMAL HIGH (ref 0–37)
CO2: 27 mEq/L (ref 19–32)
Calcium: 8.5 mg/dL (ref 8.4–10.5)
Creatinine, Ser: 0.62 mg/dL (ref 0.4–1.2)
GFR calc Af Amer: >60 mL/min (ref >60)
GFR calc non Af Amer: >60 mL/min (ref >60)

## 2011-02-04 LAB — URINALYSIS, ROUTINE W REFLEX MICROSCOPIC
Leukocytes, UA: NEGATIVE
Nitrite: NEGATIVE
Specific Gravity, Urine: 1.017 (ref 1.005–1.030)
pH: 7 (ref 5.0–8.0)

## 2011-02-04 LAB — CBC
MCHC: 33.5 g/dL (ref 30.0–36.0)
MCV: 95 fL (ref 78.0–100.0)
RBC: 4.12 MIL/uL (ref 3.87–5.11)
RDW: 13.2 % (ref 11.5–15.5)

## 2011-02-04 LAB — LIPASE, BLOOD: Lipase: 30 U/L (ref 11–59)

## 2011-02-04 LAB — DIFFERENTIAL
Eosinophils Relative: 2 % (ref 0–5)
Lymphocytes Relative: 43 % (ref 12–46)
Lymphs Abs: 3.8 10*3/uL (ref 0.7–4.0)
Neutrophils Relative %: 47 % (ref 43–77)

## 2011-02-04 LAB — URINE MICROSCOPIC-ADD ON

## 2011-03-10 NOTE — Assessment & Plan Note (Signed)
[facility]                            CARDIOLOGY OFFICE NOTE   Melanie Caldwell                      MRN:          [id number]  DATE:09/05/2008                            DOB:          05/20/1956    Melanie Caldwell is a very pleasant 55 year old female who I am asked to  evaluate for chest pain.  Note, she is the daughter of  Melanie Caldwell  who is also a patient of mine.  She states that she has had chest pain  intermittently for 4-5 years.  The pain is substernal and described as  squeezing sensation.  It does not radiate.  It is not pleuritic or  positional nor is it related to food.  It can last for hours at a time.  It occurs predominant with stress.  Note, it is not necessarily  exertional.  She does state that she can have nausea with it but there  is no shortness of breath or diaphoresis.  She did have a Myoview  performed on October 22, 2005.  At that time, her ejection fraction was  65%.  The perfusion was normal.  Because of her pain, we were asked to  further evaluate.  Note, she has been to the emergency room for her pain  in October.  She had a chest x-ray on August 02, 2008, that showed  hyperaeration consistent with COPD.  However, her cardiac markers were  negative.   PRESENT MEDICATIONS:  1. Prilosec 40 mg p.o. daily.  2. Citalopram 40 mg p.o. b.i.d.  3. Valium 10 mg p.o. daily.  4. Trazodone 50 mg to 75 mg p.o. daily.  5. P.r.n. headache medications.   ALLERGIES:  She has an allergy to CEFTIN as well as CODEINE.   SOCIAL HISTORY:  She does smoke.  She rarely consumes alcohol.   FAMILY HISTORY:  Positive for coronary artery disease as her mother has  had previous myocardial infarction.   PAST MEDICAL HISTORY:  There is no diabetes mellitus, hypertension, or  hyperlipidemia.  There is apparently a history of depression and anxiety  disorder as well as adult attention deficit disorder.  She has a history  of  gastritis/esophagitis.  She has had previous nephrolithiasis and  recurrent urinary tract infection.  She also has gastroesophageal reflux  disease.  She has had previous tonsillectomy as well as needle removed  from her foot.  She also has COPD by chest x-ray.   REVIEW OF SYSTEMS:  She denies any headaches, fevers, or chills.  There  is no productive cough or hemoptysis.  There is no dysphagia,  odynophagia, melena, or hematochezia.  There is no dysuria or hematuria.  There is no rash or seizure activity.  There is no orthopnea or PND.  There is no pedal edema.  She has had chronic pain in her lower  extremities that is not necessary with ambulation.  The remaining  systems are negative.   PHYSICAL EXAMINATION:  VITAL SIGNS:  Today shows a blood pressure of  98/72 and pulse 72.  She weighs 97 pounds.  GENERAL:  She is  well-developed, somewhat frail.  She is in no acute  distress at present.  SKIN:  Warm and dry.  She does not appear to be depressed.  There is no  peripheral clubbing.  BACK:  Normal.  HEENT:  Normal with normal eyelids.  NECK:  Supple with a normal upstroke bilaterally and there are no bruits  noted.  There is no jugular venous distention.  I cannot appreciate  thyromegaly.  CHEST:  Clear to auscultation.  No expansion.  CARDIOVASCULAR:  Regular rhythm.  Normal S1 and S2.  There are no  murmurs, rubs, or gallops noted.  There is no change of Valsalva.  ABDOMEN:  Nontender and nondistended.  Positive bowel sounds.  No  hepatosplenomegaly.  No mass appreciated.  There is no abdominal bruit.  EXTREMITIES:  She has 2+ femoral pulses bilaterally and no bruits.  Extremities show no edema, and I can palpate no cords.  She has 2+  dorsalis pedis pulses bilaterally.  NEUROLOGIC:  Grossly intact.   Her electrocardiogram shows a sinus rhythm at a rate of 72.  There are  nonspecific anterior T-wave changes.  There is right axis deviation.   DIAGNOSES:  1. Atypical chest  pain - Melanie Caldwell's symptoms are somewhat atypical.      She does have risk factors including tobacco abuse and her family      history.  We will schedule her for a stress Myoview.  If it shows      normal perfusion, then we will not pursue further cardiac workup.  2. Anxiety/depression - management per Dr. Artist Pais.  3. Tobacco abuse - we discussed the importance of discontinuing this      for between 3-10 minutes.  4. Leg pain - she wonders whether this may be related to vascular      disease.  However, I do not think the description is consistent      with this and she also has 2+ pulses.  We will not pursue this      further at this point.  5. History of nephrolithiasis/recurrent urinary tract infections.  6. History of gastritis - she will continue on her Nexium.   I will see back on a p.r.n. basis pending results of her Myoview.     Madolyn Frieze Jens Som, MD, [facility]  Electronically Signed    BSC/Med[facility]  DD: 09/05/2008  DT: 09/05/2008  Job #: [id number]   cc:   Barbette Hair. Artist Pais, DO
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
## 2011-06-04 ENCOUNTER — Encounter: Payer: Self-pay | Admitting: Cardiology

## 2011-06-11 ENCOUNTER — Observation Stay (HOSPITAL_COMMUNITY)
Admission: EM | Admit: 2011-06-11 | Discharge: 2011-06-14 | Disposition: A | Discharge status: Home / Self Care | Payer: Medicare Other | Attending: Endocrinology | Admitting: Endocrinology

## 2011-06-11 ENCOUNTER — Emergency Department (HOSPITAL_COMMUNITY): Payer: Medicare Other

## 2011-06-11 DIAGNOSIS — J4489 Other specified chronic obstructive pulmonary disease: Secondary | ICD-10-CM | POA: Insufficient documentation

## 2011-06-11 DIAGNOSIS — F341 Dysthymic disorder: Secondary | ICD-10-CM | POA: Insufficient documentation

## 2011-06-11 DIAGNOSIS — A088 Other specified intestinal infections: Principal | ICD-10-CM | POA: Insufficient documentation

## 2011-06-11 DIAGNOSIS — F17200 Nicotine dependence, unspecified, uncomplicated: Secondary | ICD-10-CM | POA: Insufficient documentation

## 2011-06-11 DIAGNOSIS — Z8673 Personal history of transient ischemic attack (TIA), and cerebral infarction without residual deficits: Secondary | ICD-10-CM | POA: Insufficient documentation

## 2011-06-11 DIAGNOSIS — G894 Chronic pain syndrome: Secondary | ICD-10-CM | POA: Insufficient documentation

## 2011-06-11 DIAGNOSIS — R32 Unspecified urinary incontinence: Secondary | ICD-10-CM | POA: Insufficient documentation

## 2011-06-11 DIAGNOSIS — Z79899 Other long term (current) drug therapy: Secondary | ICD-10-CM | POA: Insufficient documentation

## 2011-06-11 DIAGNOSIS — K219 Gastro-esophageal reflux disease without esophagitis: Secondary | ICD-10-CM | POA: Insufficient documentation

## 2011-06-11 DIAGNOSIS — R112 Nausea with vomiting, unspecified: Secondary | ICD-10-CM | POA: Insufficient documentation

## 2011-06-11 DIAGNOSIS — J449 Chronic obstructive pulmonary disease, unspecified: Secondary | ICD-10-CM | POA: Insufficient documentation

## 2011-06-11 DIAGNOSIS — E785 Hyperlipidemia, unspecified: Secondary | ICD-10-CM | POA: Insufficient documentation

## 2011-06-11 DIAGNOSIS — M549 Dorsalgia, unspecified: Secondary | ICD-10-CM | POA: Insufficient documentation

## 2011-06-11 DIAGNOSIS — R41840 Attention and concentration deficit: Secondary | ICD-10-CM | POA: Insufficient documentation

## 2011-06-11 DIAGNOSIS — K311 Adult hypertrophic pyloric stenosis: Secondary | ICD-10-CM | POA: Insufficient documentation

## 2011-06-11 LAB — URINALYSIS, ROUTINE W REFLEX MICROSCOPIC
Hgb urine dipstick: NEGATIVE
Leukocytes, UA: NEGATIVE
Nitrite: NEGATIVE
Specific Gravity, Urine: 1.017 (ref 1.005–1.030)
Urobilinogen, UA: 0.2 mg/dL (ref 0.0–1.0)

## 2011-06-11 LAB — DIFFERENTIAL
Basophils Relative: 0 % (ref 0–1)
Eosinophils Absolute: 0 10*3/uL (ref 0.0–0.7)
Monocytes Absolute: 0.6 10*3/uL (ref 0.1–1.0)
Monocytes Relative: 5 % (ref 3–12)

## 2011-06-11 LAB — COMPREHENSIVE METABOLIC PANEL
ALT: 14 U/L (ref 0–35)
Calcium: 9.5 mg/dL (ref 8.4–10.5)
Creatinine, Ser: 0.55 mg/dL (ref 0.50–1.10)
GFR calc Af Amer: >60 mL/min (ref >60)
Glucose, Bld: 114 mg/dL — ABNORMAL HIGH (ref 70–99)
Sodium: 141 mEq/L (ref 135–145)
Total Protein: 7.3 g/dL (ref 6.0–8.3)

## 2011-06-11 LAB — CBC
Hemoglobin: 14.4 g/dL (ref 12.0–15.0)
MCH: 30.6 pg (ref 26.0–34.0)
MCHC: 33.1 g/dL (ref 30.0–36.0)
Platelets: 278 10*3/uL (ref 150–400)

## 2011-06-11 IMAGING — CR DG ABDOMEN ACUTE W/ 1V CHEST
3 series · 3 of 3 positions shown · non-contrast
Comparison: PA and lateral chest radiograph - [DATE]; abdominal
radiograph - [DATE]; CT of the abdomen and pelvis - [DATE]

CLINICAL DATA: Mid and left-sided chest and abdominal pain,
vomiting and nausea for 3 days, occasional smoker

ACUTE ABDOMEN SERIES (ABDOMEN 2 VIEW & CHEST 1 VIEW)

[w chest pa]
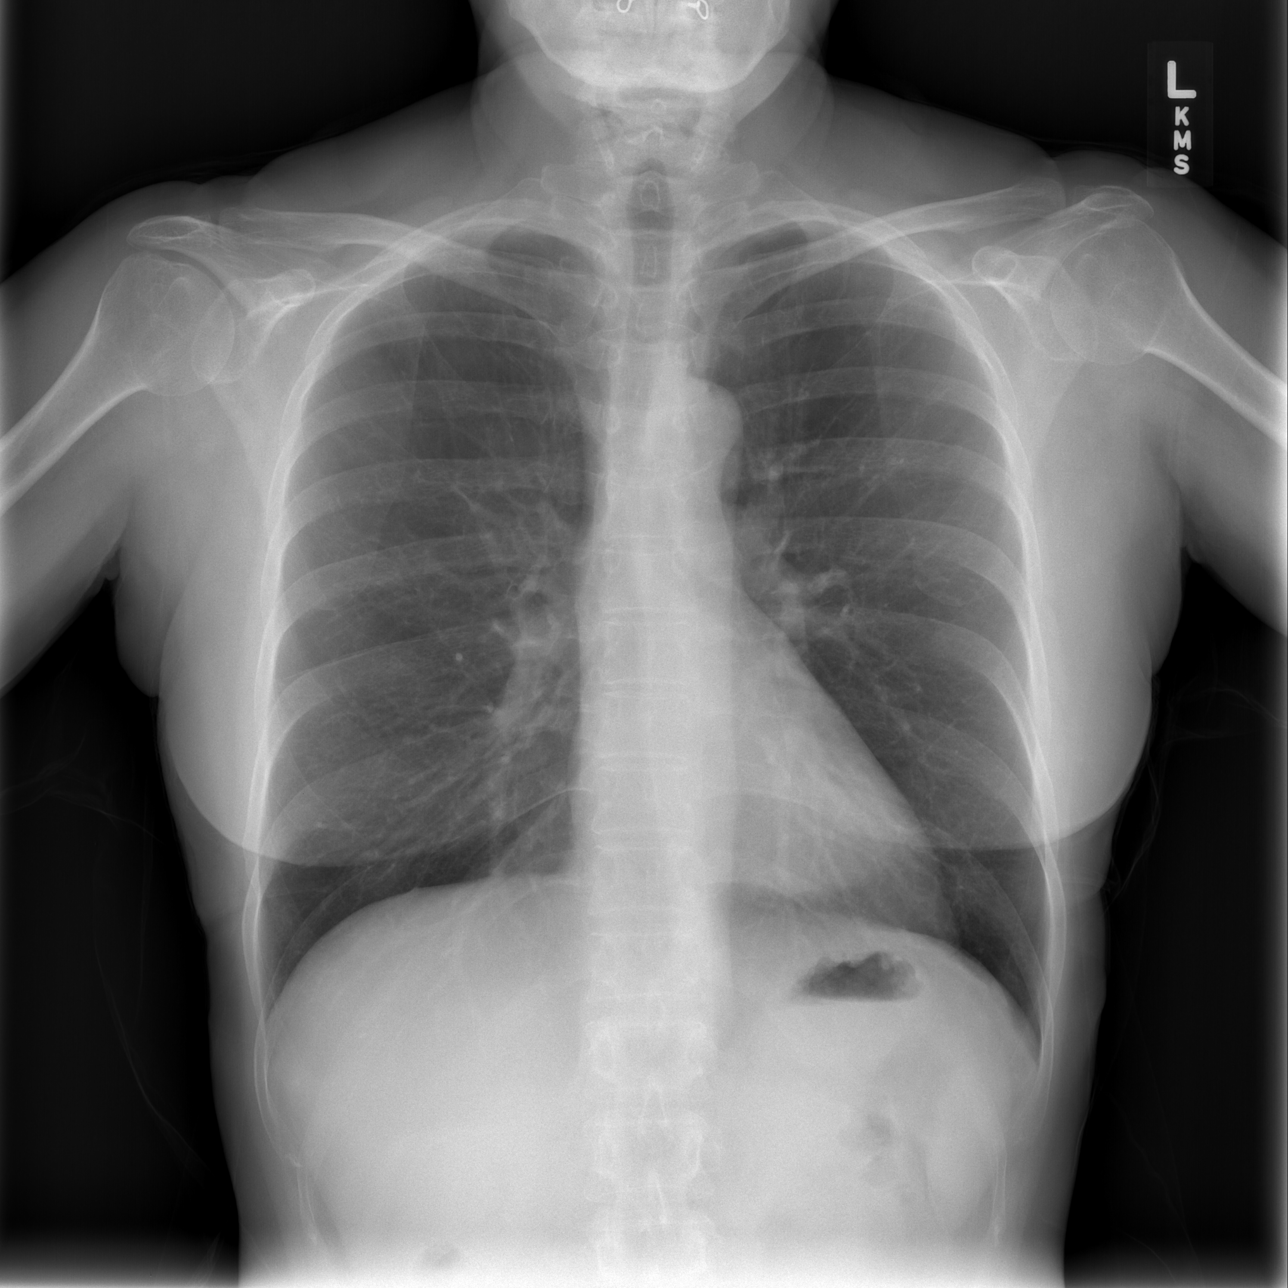

[w abdomen upright]
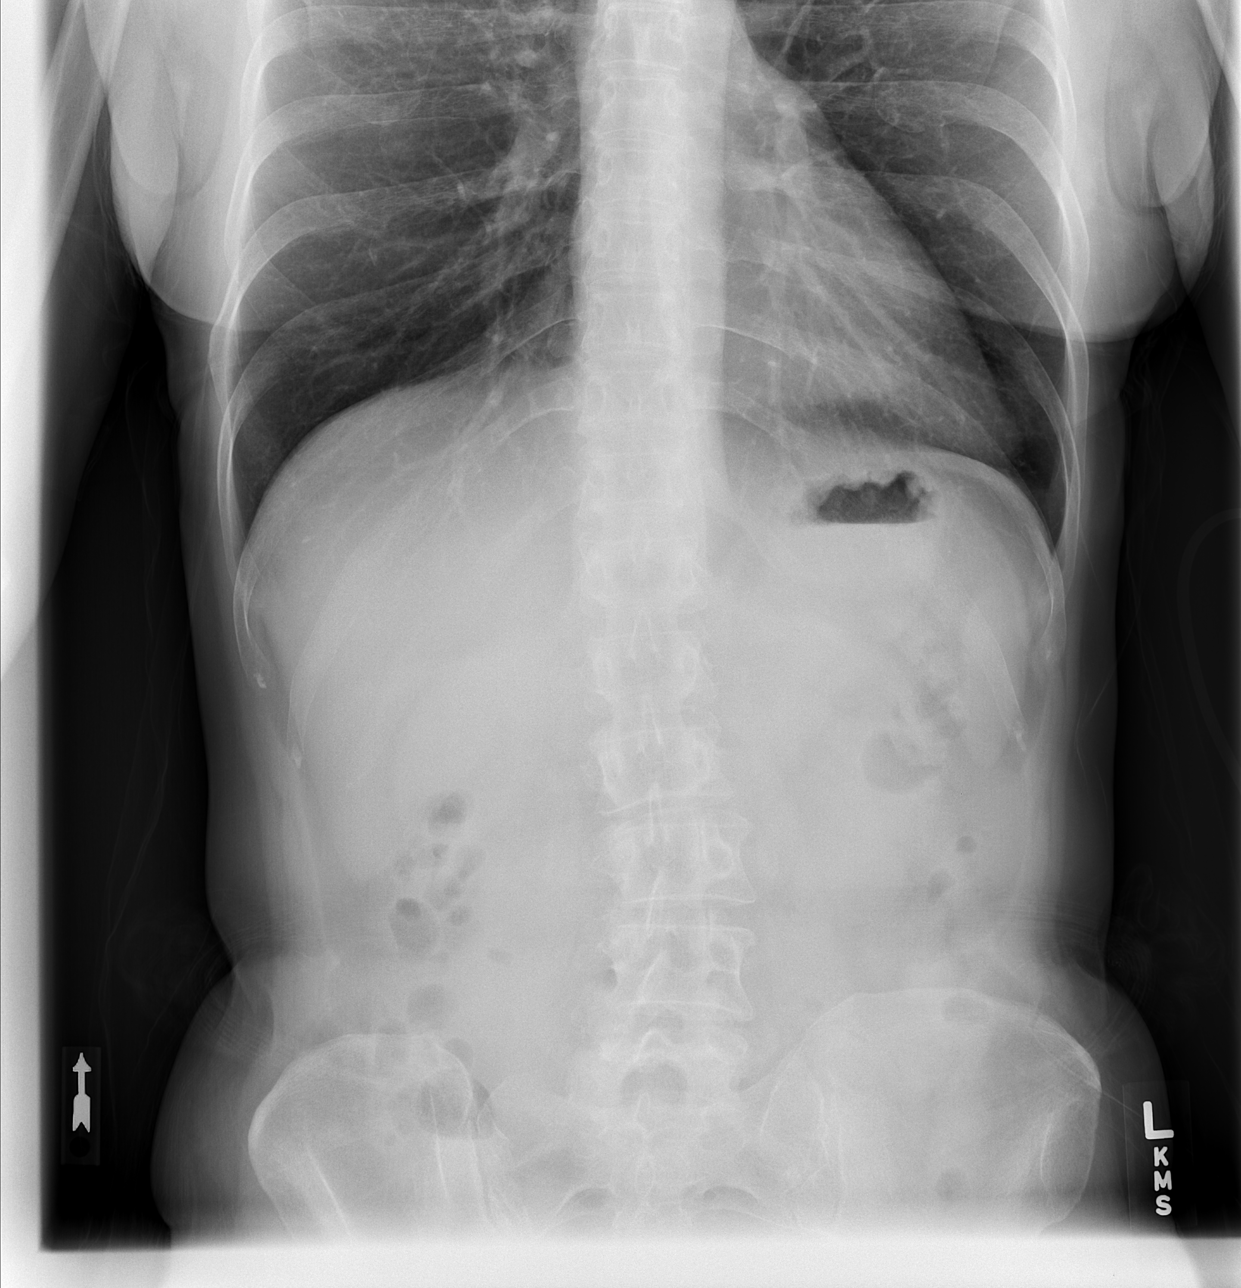

[t abdomen supine]
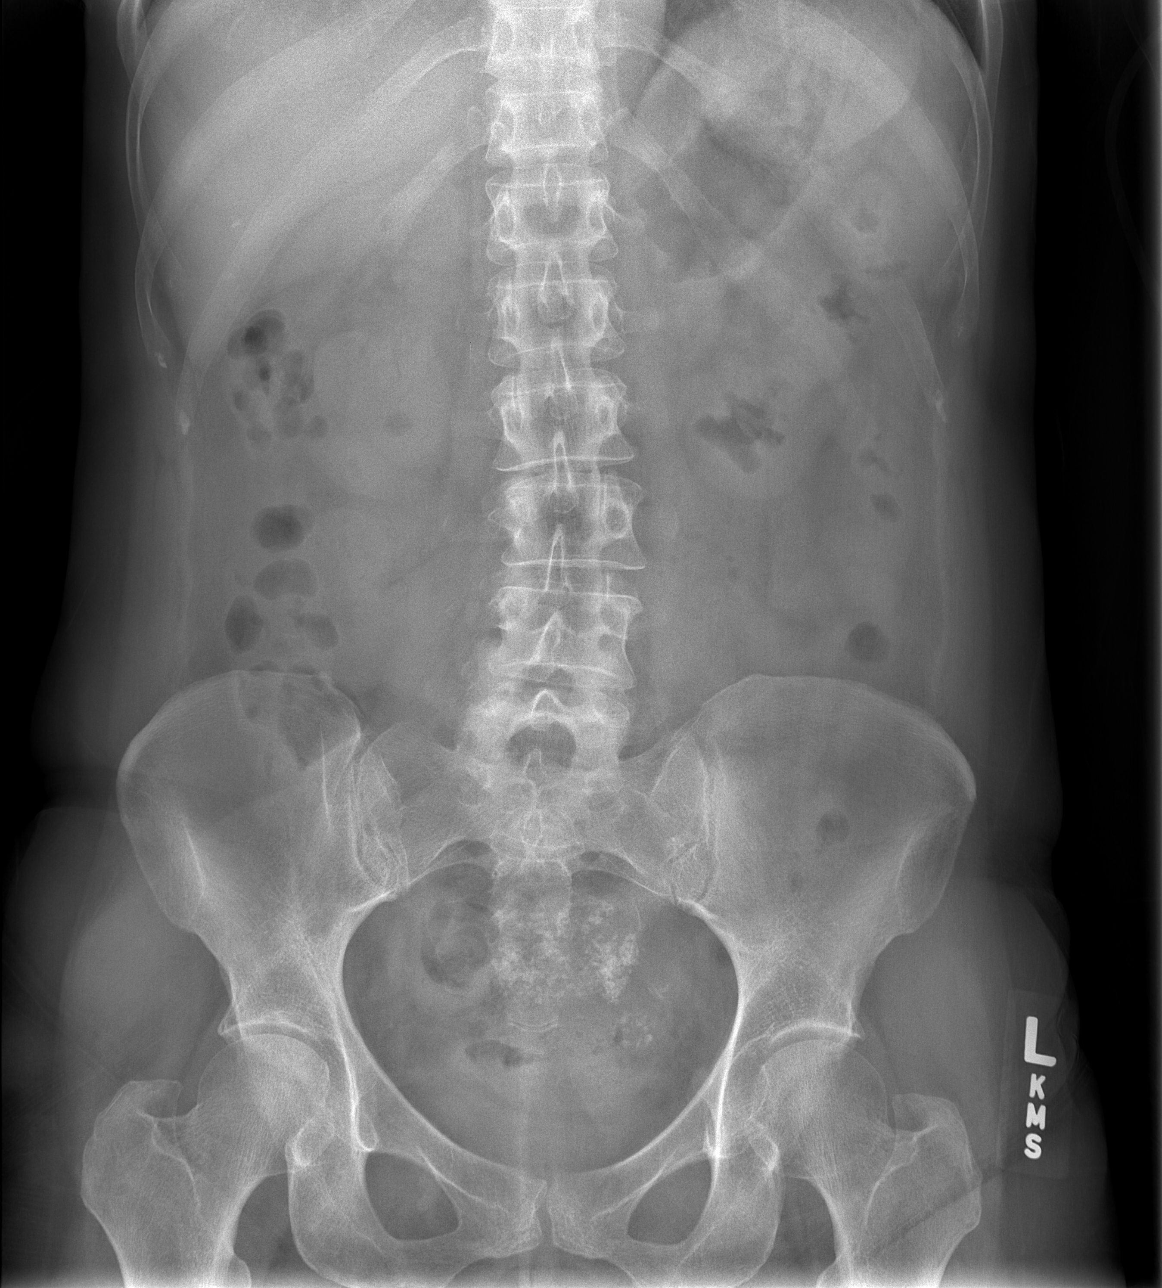

[3 of 3 positions shown; findings below may reference images not displayed]

FINDINGS: Unchanged cardiac silhouette and mediastinal contours.
No focal parenchymal opacities.  No pleural effusion or
pneumothorax.

Nonobstructive bowel gas pattern.  Calcifications overlying the
pelvis likely represent the uterine fibroids seen on prior
abdominal CT.  No pneumoperitoneum, pneumatosis or portal venous
gas.  Mild S-shaped scoliosis scoliotic curvature of the lower
thoracic/lumbar spine.
IMPRESSION: 1.  No acute cardiopulmonary disease.
2.  Nonobstructive bowel gas pattern.

## 2011-06-12 DIAGNOSIS — R112 Nausea with vomiting, unspecified: Secondary | ICD-10-CM

## 2011-06-12 LAB — CBC
MCV: 92.3 fL (ref 78.0–100.0)
Platelets: 247 10*3/uL (ref 150–400)
RBC: 4.55 MIL/uL (ref 3.87–5.11)
RDW: 12.9 % (ref 11.5–15.5)
WBC: 14.9 10*3/uL — ABNORMAL HIGH (ref 4.0–10.5)

## 2011-06-12 LAB — COMPREHENSIVE METABOLIC PANEL
ALT: 12 U/L (ref 0–35)
AST: 15 U/L (ref 0–37)
Albumin: 3.4 g/dL — ABNORMAL LOW (ref 3.5–5.2)
Alkaline Phosphatase: 76 U/L (ref 39–117)
CO2: 21 mEq/L (ref 19–32)
Chloride: 106 mEq/L (ref 96–112)
Potassium: 4.6 mEq/L (ref 3.5–5.1)
Sodium: 136 mEq/L (ref 135–145)
Total Bilirubin: 0.4 mg/dL (ref 0.3–1.2)

## 2011-06-13 DIAGNOSIS — K311 Adult hypertrophic pyloric stenosis: Secondary | ICD-10-CM

## 2011-06-14 ENCOUNTER — Observation Stay (HOSPITAL_COMMUNITY): Payer: Medicare Other

## 2011-06-14 DIAGNOSIS — K311 Adult hypertrophic pyloric stenosis: Secondary | ICD-10-CM

## 2011-06-14 DIAGNOSIS — R112 Nausea with vomiting, unspecified: Secondary | ICD-10-CM

## 2011-06-14 LAB — CBC
Hemoglobin: 13.3 g/dL (ref 12.0–15.0)
MCH: 30.4 pg (ref 26.0–34.0)
MCHC: 32.8 g/dL (ref 30.0–36.0)
MCV: 92.5 fL (ref 78.0–100.0)
Platelets: 250 10*3/uL (ref 150–400)
RBC: 4.38 MIL/uL (ref 3.87–5.11)

## 2011-06-14 LAB — COMPREHENSIVE METABOLIC PANEL
CO2: 24 mEq/L (ref 19–32)
Calcium: 9.5 mg/dL (ref 8.4–10.5)
Creatinine, Ser: <0.47 mg/dL — ABNORMAL LOW (ref 0.50–1.10)
Glucose, Bld: 116 mg/dL — ABNORMAL HIGH (ref 70–99)
Total Protein: 6.7 g/dL (ref 6.0–8.3)

## 2011-06-14 LAB — URINE CULTURE: Colony Count: 40000

## 2011-06-14 IMAGING — RF DG UGI W/ GASTROGRAFIN
15 series · 15 of 15 positions shown · IV contrast (agent unspecified)
Comparison: [DATE], CT [DATE]

CLINICAL DATA: Status post endoscopy yesterday.  Dilatation of the
pylorus.  Evaluate for leak.

WATER SOLUBLE UPPER GI SERIES
TECHNIQUE: Single-column upper GI series was performed using water
soluble contrast.
Fluoroscopy Time: 1.3 minutes
Contrast: 50 ml [C8]

[Series 1: run · 1 of 1 slices shown (1 of 14)]
[im 1/1]
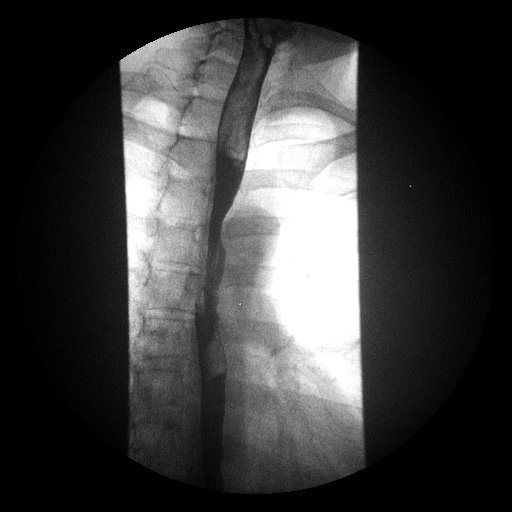

[Series 2: run · 1 of 1 slices shown (2 of 14)]
[im 1/1]
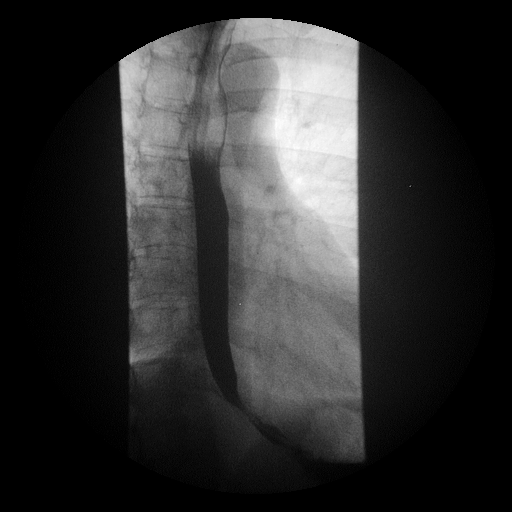

[Series 3: run · 1 of 1 slices shown (3 of 14)]
[im 1/1]
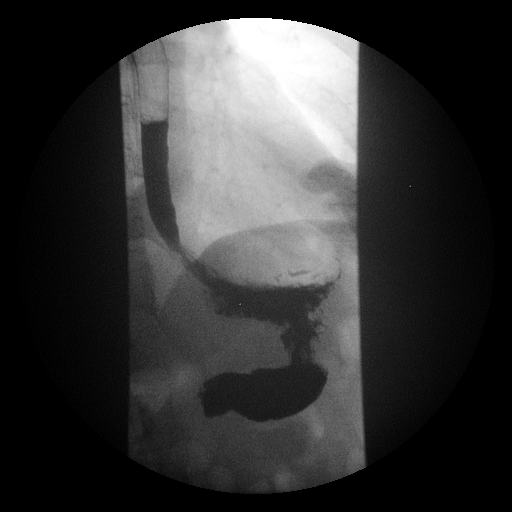

[Series 4: run · 1 of 1 slices shown (4 of 14)]
[im 1/1]
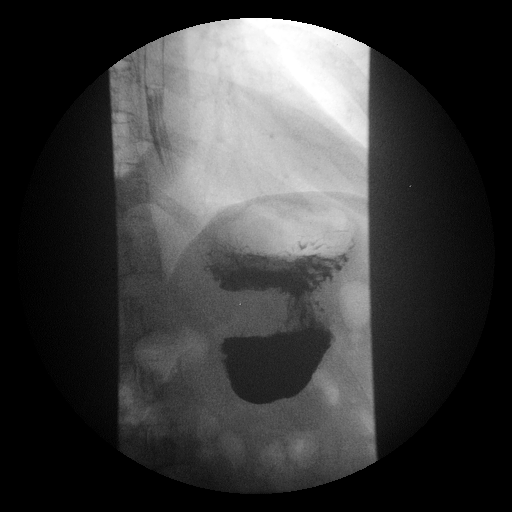

[Series 5: run · 1 of 1 slices shown (5 of 14)]
[im 1/1]
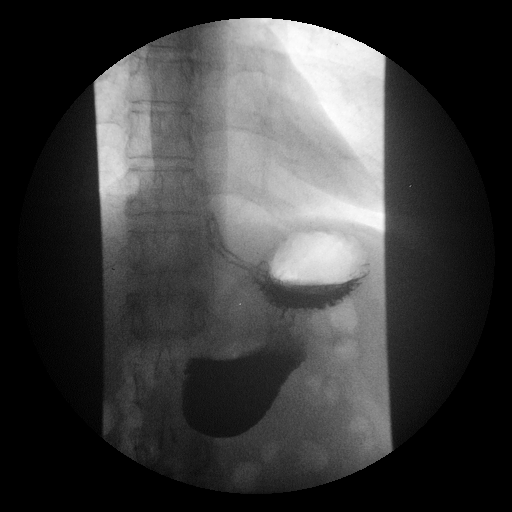

[Series 6: run · 1 of 1 slices shown (6 of 14)]
[im 1/1]
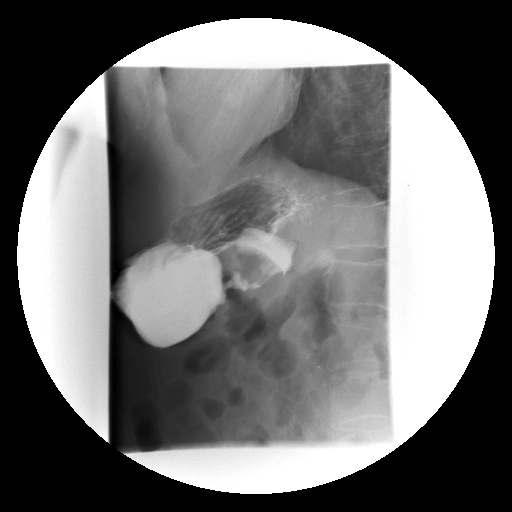

[Series 7: run · 1 of 1 slices shown (7 of 14)]
[im 1/1]
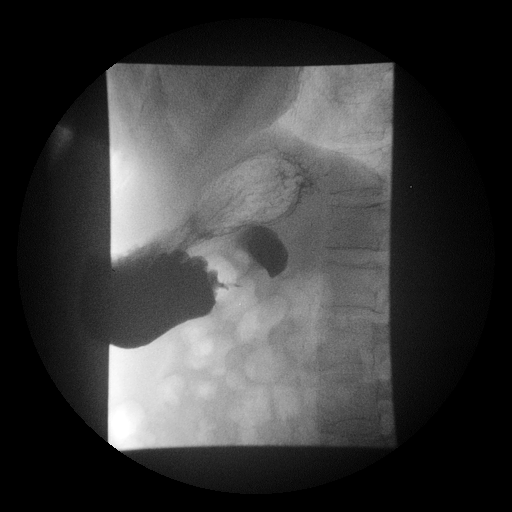

[Series 8: run · 1 of 1 slices shown (8 of 14)]
[im 1/1]
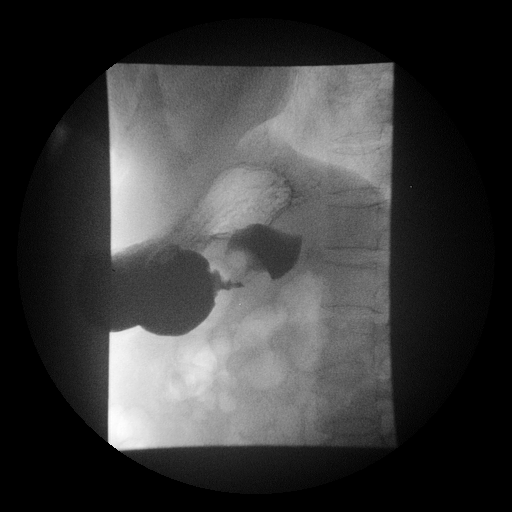

[Series 9: run · 1 of 1 slices shown (9 of 14)]
[im 1/1]
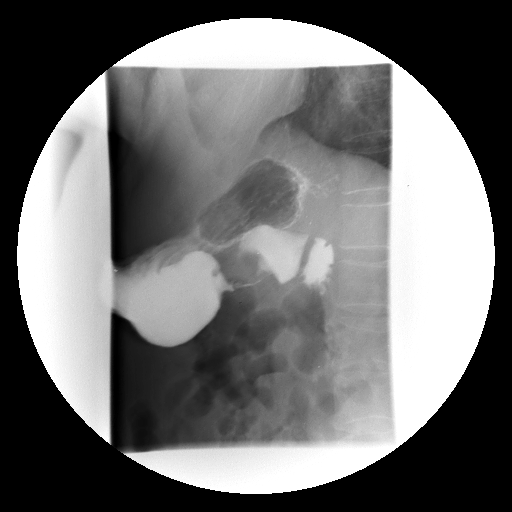

[Series 10: run · 1 of 1 slices shown (10 of 14)]
[im 1/1]
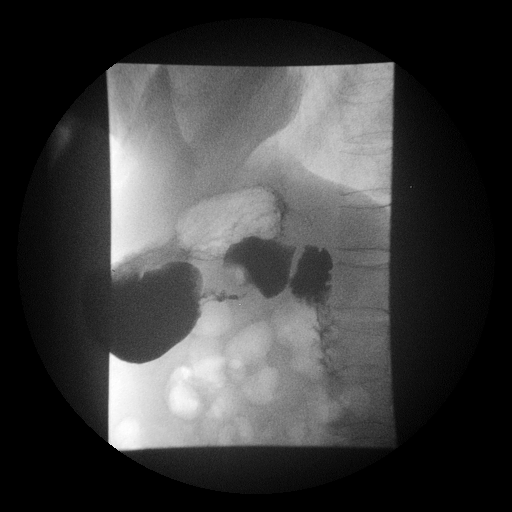

[Series 11: run · 1 of 1 slices shown (11 of 14)]
[im 1/1]
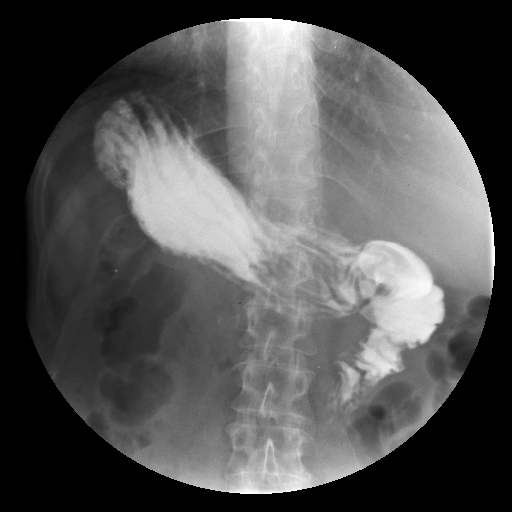

[Series 12: run · 1 of 1 slices shown (12 of 14)]
[im 1/1]
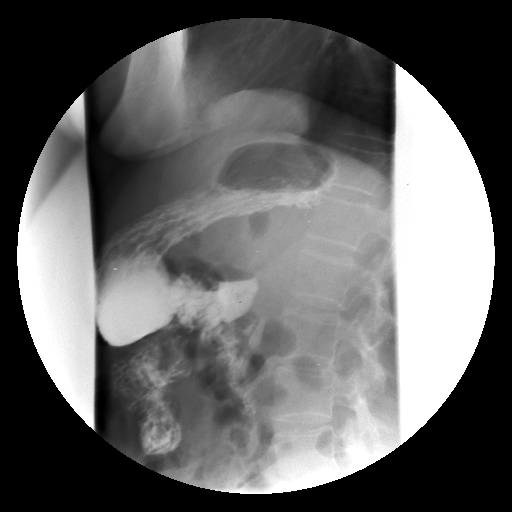

[Series 13: run · 1 of 1 slices shown (13 of 14)]
[im 1/1]
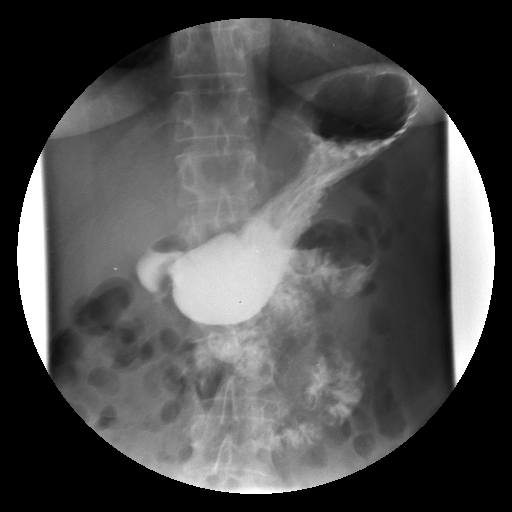

[Series 14: run · 1 of 1 slices shown (14 of 14)]
[im 1/1]
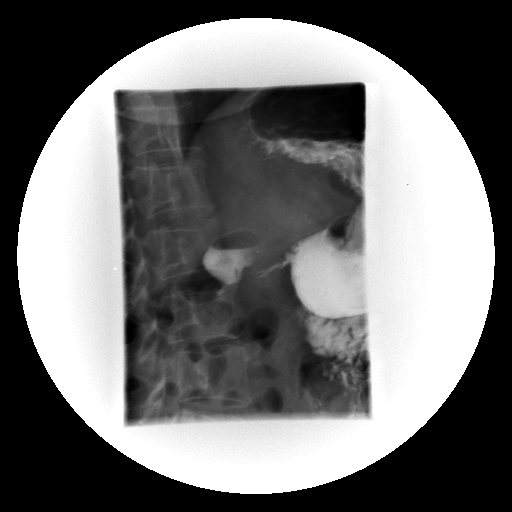

[Series 1001: view not recorded · 0.20mm/px · 1 of 1 slices shown]
[im 1/1]
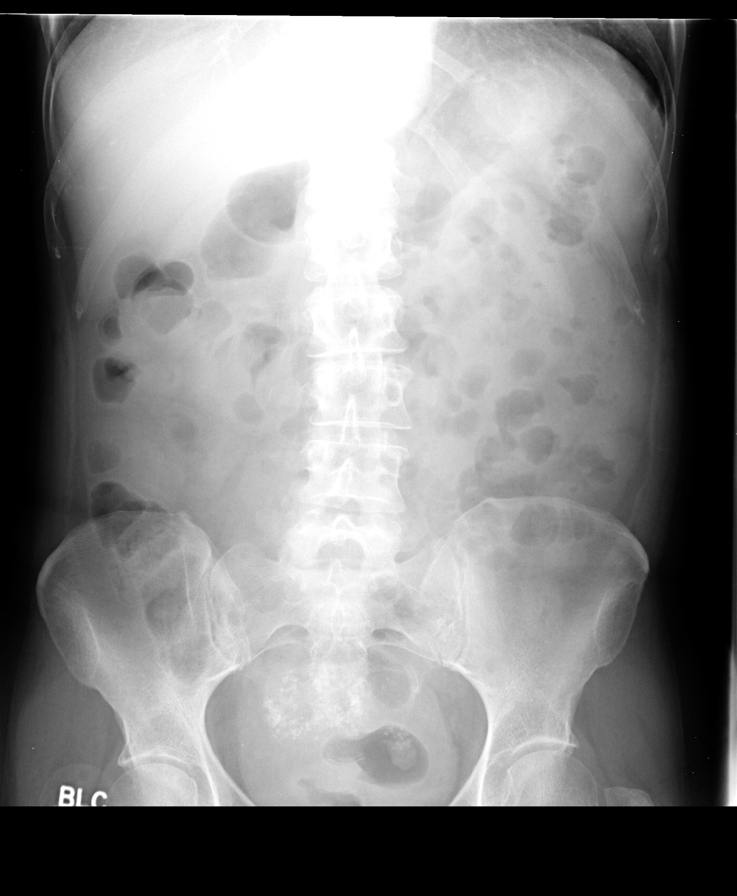

[15 of 15 positions shown; findings below may reference images not displayed]

FINDINGS: The scout film shows normal bowel gas pattern.  No
evidence for dilatation, obstruction.  Calcifications within the
pelvis are likely related to calcified fibroids.

During fluoroscopic evaluation, oral contrast was administered. The
esophageal contour and visualized mucosal appearance are normal.
The gastric contour is normal.

The pylorus is narrowed and mildly irregular.  There is delayed
filling of the duodenum.  However, there is no evidence for leak.
IMPRESSION: 1.  Narrow pyloric channel.
2.  Mildly irregular pyloric lumen but no evidence for leak.
3.  Delayed gastric emptying, consistent with postoperative edema.

## 2011-06-14 MED ORDER — IOHEXOL 300 MG/ML  SOLN
50.0000 mL | Freq: Once | INTRAMUSCULAR | Status: AC | PRN
  Administered 2011-06-14: 50 mL via ORAL

## 2011-07-07 NOTE — Discharge Summary (Signed)
NAMEANANI, GU               ACCOUNT NO.:  [id number]  MEDICAL RECORD NO.:  [id number]  LOCATION:  1313                         FACILITY:  [facility]  PHYSICIAN:  Tera Mater. Evlyn Kanner, M.D. DATE OF BIRTH:  1956-07-12  DATE OF ADMISSION:  06/11/2011 DATE OF DISCHARGE:  06/14/2011                              DISCHARGE SUMMARY   DISCHARGE DIAGNOSES: 1. Nausea and vomiting, felt secondary to viral gastroenteritis,     clinically improved. 2. Peptic stricture now dilated, felt to be a secondary finding. 3. Normal barium swallow without evidence of blockage. 4. Adult attention-deficit disorder. 5. Chronic pain syndrome, on medications. 6. Anxiety and depression. 7. Gastroesophageal reflux. 8. Urinary incontinence. 9. Hyperlipidemia.  CONSULTATIONS:  Dr. Melvia Heaps of [facility].  PROCEDURES:  An endoscopy, 06/13/2011, and an upper GI, 06/14/2011.  BRIEF HISTORY AND PHYSICAL AND HOSPITAL COURSE:  Ms. Selner is a 55-year white female, longstanding patient of the GI Service, who presented with nausea, vomiting and weakness.  An extensive workup was then undertaken and fortunately it appears that she just had a viral gastroenteritis. There was, however, the issue of a peptic stricture that was noted.  We could not pass the scope through so we needed to do an upper GI to make sure she was not blocked.  She did have the test this morning and barium passed freely without difficulty.  At the present time, she is doing moderately well.  She has a bit of residual nausea but not enough to keep her in the hospital.  She has had no fevers, chills or sweats.  Her volume status has been good.  She has had no other intercurrent illnesses.  PERTINENT LABORATORY AND X-RAY DATA:  A urine culture was done which probably showed contaminant of E coli and enterococcus.  I do not think that caused any colonization issues.  Her chemistries 06/14/2011: Sodium 139, potassium 5.2, chloride 108, CO2 16,  BUN 3, creatinine less than 0.47, glucose of 116.  Liver testing was normal.  Albumin was 3.4, calcium was 9.5.  Her white count 06/14/2011 was 10,200, hemoglobin 13.3, platelets 250,000.  At presentation, white count was 11,600, hemoglobin 14.4, platelets 278,000.  Initial chemistries:  Sodium 141, potassium 3.1, chloride 100, CO2 29, BUN 21, creatinine 0.55, glucose of 114.  Liver testing again was normal at that point.  Lipase was normal at 28.  A UA just showed basically ketones.  A two-view abdomen showed no acute cardiopulmonary disease, nonobstructive bowel gas pattern.  The endoscopy, as noted, did show the stricture.  Basically we have a 55 year old with a viral gastroenteritis in the setting of complex GI history.  She clinically is doing better and is ready to be discharged.  DISCHARGE MEDICATIONS: 1. Promethazine 1 four times a day as needed for nausea. 2. Adderall 30 twice daily. 3. Celexa 40 once daily. 4. Fish oil daily. 5. Meloxicam to be used p.r.n. but try to avoid the use in the next     few days. 6. Morphine sulfate:  She is on chronically the CR 30 mg twice daily. 7. Simvastatin 40 daily. 8. Trimethoprim as suppressant. 9. Valium 10 three times a day. 10.Vitamin B12. 11.Vesicare.  FOLLOWUP:  She will see me in 2 to 3 weeks.  She has no need for acute GI followup.          ______________________________ Tera Mater. Evlyn Kanner, M.D.     SAS/MEDQ  D:  06/14/2011  T:  06/14/2011  Job:  [id number]  Electronically Signed by Adrian Prince M.D. on 07/07/2011 01:30:53 PM

## 2011-07-07 NOTE — H&P (Signed)
NAMEKORTNY, LIRETTE               ACCOUNT NO.:  [id number]  MEDICAL RECORD NO.:  [id number]  LOCATION:  1313                         FACILITY:  [facility]  PHYSICIAN:  Tera Mater. [place], M.D. DATE OF BIRTH:  09/14/1956  DATE OF ADMISSION:  06/11/2011 DATE OF DISCHARGE:                             HISTORY & PHYSICAL   HISTORY OF PRESENT ILLNESS:  Ms. Merrick is a 55 year old white female with a history of chronic GI complaints, hyperlipidemia, anxiety, and depression.  She presents after three days of intractable nausea and vomiting.  She has had several episodes and skeptically no food down for two days.  She said the problems was similar ways in the past and had a full workup by GI with extensive testing around the first of the year. This current episode came on fairly abruptly three days ago.  She had some diarrhea on the first day and loose stool today, but not in between.  She has tried to keep eating but has not kept much down.  She sees no blood in the vomitus.  She has abdominal soreness and weakness just from throwing up 70 times.  She has some pain in her upper abdomen lower chest that is pressure when she swallows or takes a deep breath. She has had no real cardiac chest pain.  She is not short of breath beyond baseline.  She does have a general achiness.  She had chills that started from her feeding came up.  She has had some muscle grams.  She had been weak with some headaches.  She has no unusual travel or exposures.  Her mother has not been sick.  Her father is a nursing home and he has not been exposed anything there.  Her back chronically hurts, I should notice as well up above.  MEDICATIONS:  Her medication list from our office: 1. Adderall 15 twice daily. 2. Trazodone 100 at bedtime. 3. Trimethoprim for suppression for chronic UTIs. 4. Norco b.i.d. 5. Diazepam 10 three times a day. 6. Mobic 50 daily. 7. Flonase. 8. Citalopram 40 twice daily. 9. Zegerid OTC twice  daily. 10.Vesicare 10 daily. 11.Promethazine 25 daily. 12.Simvastatin 40 daily.  PAST MEDICAL HISTORY:  Anxiety and depression over the post surgery, attention deficit disorder, gastritis and esophagitis, nephrolithiasis, recurrent UTI, and COPD by x-ray.  She has prior anterior T-wave changes and right axis deviation on EKG.  She had a CT of the chest in 2009 that showed hemangioma.  She has had recreational drug use before, tobacco use, asthma, a negative Myoview in 2009 with normal EF.  She has had fibroids and chronic UTI since age 2.  SURGICAL HISTORY:  She has had middle ear exploration in 2011 and she had tonsillectomy and lithotripsy.  FAMILY HISTORY:  Father has diabetes and dementia.  Mom has high triglycerides.  She is single, never married, unemployed.  She is a smoker.  She has previously used alcohol too excess.  Her GI workup included hepatobiliary scan back in January 2012 that was normal and ultrasound back in October was negative with no other issues.  PAST FAMILY PSYCHIATRIC HISTORY:  VITAL SIGNS:  Today on exam, O2 sats are 100%, pulse  is about 55 with frequent PVCs, respiratory rate 10 and unlabored, and temperature was 97.9. GENERAL:  She appeared older than stated age, white female, lying flat with no dyspnea.  She seems mildly uncomfortable. HEENT:  Sclerae anicteric.  Extraocular was intact with no nystagmus. Oral mucous membranes are moist.  She has an upper denture plate in place. NECK:  Supple with no bruits or thyromegaly is present. LUNGS:  Clear without wheezes, rales, or rhonchi but distant breath sounds are noted. HEART:  Slow and regular with no murmur appreciated. ABDOMEN:  Mildly distended with hypoactive bowel sounds.  She has a diffuse mild abdominal wall tenderness with no deep tenderness, no rebound.  No masses or pulsations noted. EXTREMITIES:  Strong distal pulses with no edema. NEUROLOGIC:  The patient is awake, alert, and mentating  well.  Speech is clear.  No tremors present.  LABORATORY DATA:  An EKG shows low voltage and nonspecific T-wave changes anteriorly, this is unchanged from before.  Her radiology testing today shows  acute abdomen series, no acute cardiopulmonary disease and nonobstructive bowel gas pattern.  Her labs today, urinalysis showed 80 mg of ketone, otherwise was negative.  A lipase is normal at 28.  Chemistries reveal sodium 141, potassium 3.1, chloride 100, CO2 29, BUN 21, creatinine 0.55, and glucose of 114.  Estimated GFR is greater than 60, albumin is 3.8, and calcium is 9.5.  White count 11,600, hemoglobin 14.4, and platelets 278,000.  ASSESSMENT/PLAN:  In summary, this is a 55 year old white female with a history of transient ischemic attack.  She is now presenting with three days of nausea and vomiting.  Her x-ray is not suggestive of any blockage, I do not see any clinical findings that suggest any surgical abdomen problems.  Certainly, she is uncomfortable even though she has received some fluids.  She still been vomiting.  There is no evidence of any urinary tract infection causing this.  No evidence of pancreatic inflammation on lab testing is noted.  I do not see any indication for a CT at the present time.  I think this is a viral gastroenteritis that is superimposed upon a fragile GI system.  We are going to treat her fluids, hydration, and serial labs.  She does have some interesting bradycardia in the setting of this but I do not see anything it like cardiac-type chest pain.  At the present time, she is relatively nontoxic.  No significant fluid deficits are present but she does have a low potassium.  We will keep her n.p.o. at the present time and basically at best as clinically routed.  I will give some her pain medications since she chronically takes narcotics.          ______________________________ Tera Mater Evlyn Kanner, M.D.     SAS/MEDQ  D:  06/11/2011  T:   06/12/2011  Job:  [id number]  Electronically Signed by Adrian Prince M.D. on 07/07/2011 01:30:58 PM

## 2011-07-27 LAB — POCT I-STAT, CHEM 8
BUN: 14
Calcium, Ion: 1.05 — ABNORMAL LOW
Chloride: 108
Creatinine, Ser: 0.9
Glucose, Bld: 76

## 2011-07-27 LAB — CBC
HCT: 42.2
Hemoglobin: 14
RBC: 4.5

## 2011-07-27 LAB — POCT CARDIAC MARKERS: Myoglobin, poc: 39.2

## 2011-07-27 LAB — DIFFERENTIAL
Basophils Absolute: 0.1
Lymphocytes Relative: 49 — ABNORMAL HIGH
Lymphs Abs: 4.7 — ABNORMAL HIGH
Monocytes Absolute: 0.6
Monocytes Relative: 6
Neutro Abs: 4.1

## 2011-07-28 LAB — POCT CARDIAC MARKERS
CKMB, poc: 1.8
Myoglobin, poc: 29.4
Troponin i, poc: <0.05

## 2011-07-28 LAB — CBC
Hemoglobin: 14.3
Platelets: 220
Platelets: 257
RDW: 13.2
WBC: 7.4

## 2011-07-28 LAB — CARDIAC PANEL(CRET KIN+CKTOT+MB+TROPI)
CK, MB: 1.7
CK, MB: 1.8
Relative Index: INVALID
Total CK: 39
Troponin I: <0.01
Troponin I: <0.01

## 2011-07-28 LAB — BASIC METABOLIC PANEL
Calcium: 9.8
GFR calc non Af Amer: >60
Glucose, Bld: 93
Sodium: 140

## 2011-07-28 LAB — LIPID PANEL
Cholesterol: 193
HDL: 30 — ABNORMAL LOW
Triglycerides: 192 — ABNORMAL HIGH

## 2011-07-28 LAB — D-DIMER, QUANTITATIVE: D-Dimer, Quant: <0.22

## 2011-07-28 LAB — CK TOTAL AND CKMB (NOT AT ARMC): Relative Index: INVALID

## 2011-07-28 LAB — URINALYSIS, ROUTINE W REFLEX MICROSCOPIC
Glucose, UA: NEGATIVE
Hgb urine dipstick: NEGATIVE
Specific Gravity, Urine: 1.012
pH: 6

## 2011-07-28 LAB — TSH: TSH: 2.193

## 2011-07-28 LAB — SAMPLE TO BLOOD BANK

## 2011-08-11 ENCOUNTER — Emergency Department (HOSPITAL_COMMUNITY): Payer: Medicare Other

## 2011-08-11 ENCOUNTER — Emergency Department (HOSPITAL_COMMUNITY)
Admission: EM | Admit: 2011-08-11 | Discharge: 2011-08-11 | Disposition: A | Discharge status: Home / Self Care | Payer: Medicare Other | Attending: Emergency Medicine | Admitting: Emergency Medicine

## 2011-08-11 DIAGNOSIS — F988 Other specified behavioral and emotional disorders with onset usually occurring in childhood and adolescence: Secondary | ICD-10-CM | POA: Insufficient documentation

## 2011-08-11 DIAGNOSIS — K219 Gastro-esophageal reflux disease without esophagitis: Secondary | ICD-10-CM | POA: Insufficient documentation

## 2011-08-11 DIAGNOSIS — F411 Generalized anxiety disorder: Secondary | ICD-10-CM | POA: Insufficient documentation

## 2011-08-11 DIAGNOSIS — M549 Dorsalgia, unspecified: Secondary | ICD-10-CM | POA: Insufficient documentation

## 2011-08-11 DIAGNOSIS — K222 Esophageal obstruction: Secondary | ICD-10-CM | POA: Insufficient documentation

## 2011-08-11 DIAGNOSIS — F3289 Other specified depressive episodes: Secondary | ICD-10-CM | POA: Insufficient documentation

## 2011-08-11 DIAGNOSIS — Z87442 Personal history of urinary calculi: Secondary | ICD-10-CM | POA: Insufficient documentation

## 2011-08-11 DIAGNOSIS — E876 Hypokalemia: Secondary | ICD-10-CM | POA: Insufficient documentation

## 2011-08-11 DIAGNOSIS — F329 Major depressive disorder, single episode, unspecified: Secondary | ICD-10-CM | POA: Insufficient documentation

## 2011-08-11 DIAGNOSIS — R109 Unspecified abdominal pain: Secondary | ICD-10-CM | POA: Insufficient documentation

## 2011-08-11 LAB — URINE MICROSCOPIC-ADD ON

## 2011-08-11 LAB — CBC
Hemoglobin: 15.1 g/dL — ABNORMAL HIGH (ref 12.0–15.0)
MCHC: 31.8 g/dL (ref 30.0–36.0)
RDW: 12.6 % (ref 11.5–15.5)

## 2011-08-11 LAB — DIFFERENTIAL
Basophils Absolute: 0 10*3/uL (ref 0.0–0.1)
Basophils Relative: 0 % (ref 0–1)
Monocytes Absolute: 0.6 10*3/uL (ref 0.1–1.0)
Neutro Abs: 8.2 10*3/uL — ABNORMAL HIGH (ref 1.7–7.7)

## 2011-08-11 LAB — COMPREHENSIVE METABOLIC PANEL
ALT: 15 U/L (ref 0–35)
AST: 17 U/L (ref 0–37)
Albumin: 4.4 g/dL (ref 3.5–5.2)
Alkaline Phosphatase: 102 U/L (ref 39–117)
Glucose, Bld: 138 mg/dL — ABNORMAL HIGH (ref 70–99)
Potassium: 2.7 mEq/L — CL (ref 3.5–5.1)
Sodium: 142 mEq/L (ref 135–145)
Total Protein: 8.5 g/dL — ABNORMAL HIGH (ref 6.0–8.3)

## 2011-08-11 LAB — URINALYSIS, ROUTINE W REFLEX MICROSCOPIC
Glucose, UA: 100 mg/dL — AB
Hgb urine dipstick: NEGATIVE
Protein, ur: >300 mg/dL — AB
pH: 6.5 (ref 5.0–8.0)

## 2011-08-11 IMAGING — CR DG ABDOMEN ACUTE W/ 1V CHEST
3 series · 3 of 3 positions shown · non-contrast
Comparison: Acute abdominal series [DATE].

CLINICAL DATA: Mid chest and abdominal pain with nausea and
vomiting for 3 days.

ACUTE ABDOMEN SERIES (ABDOMEN 2 VIEW & CHEST 1 VIEW)

[w chest pa]
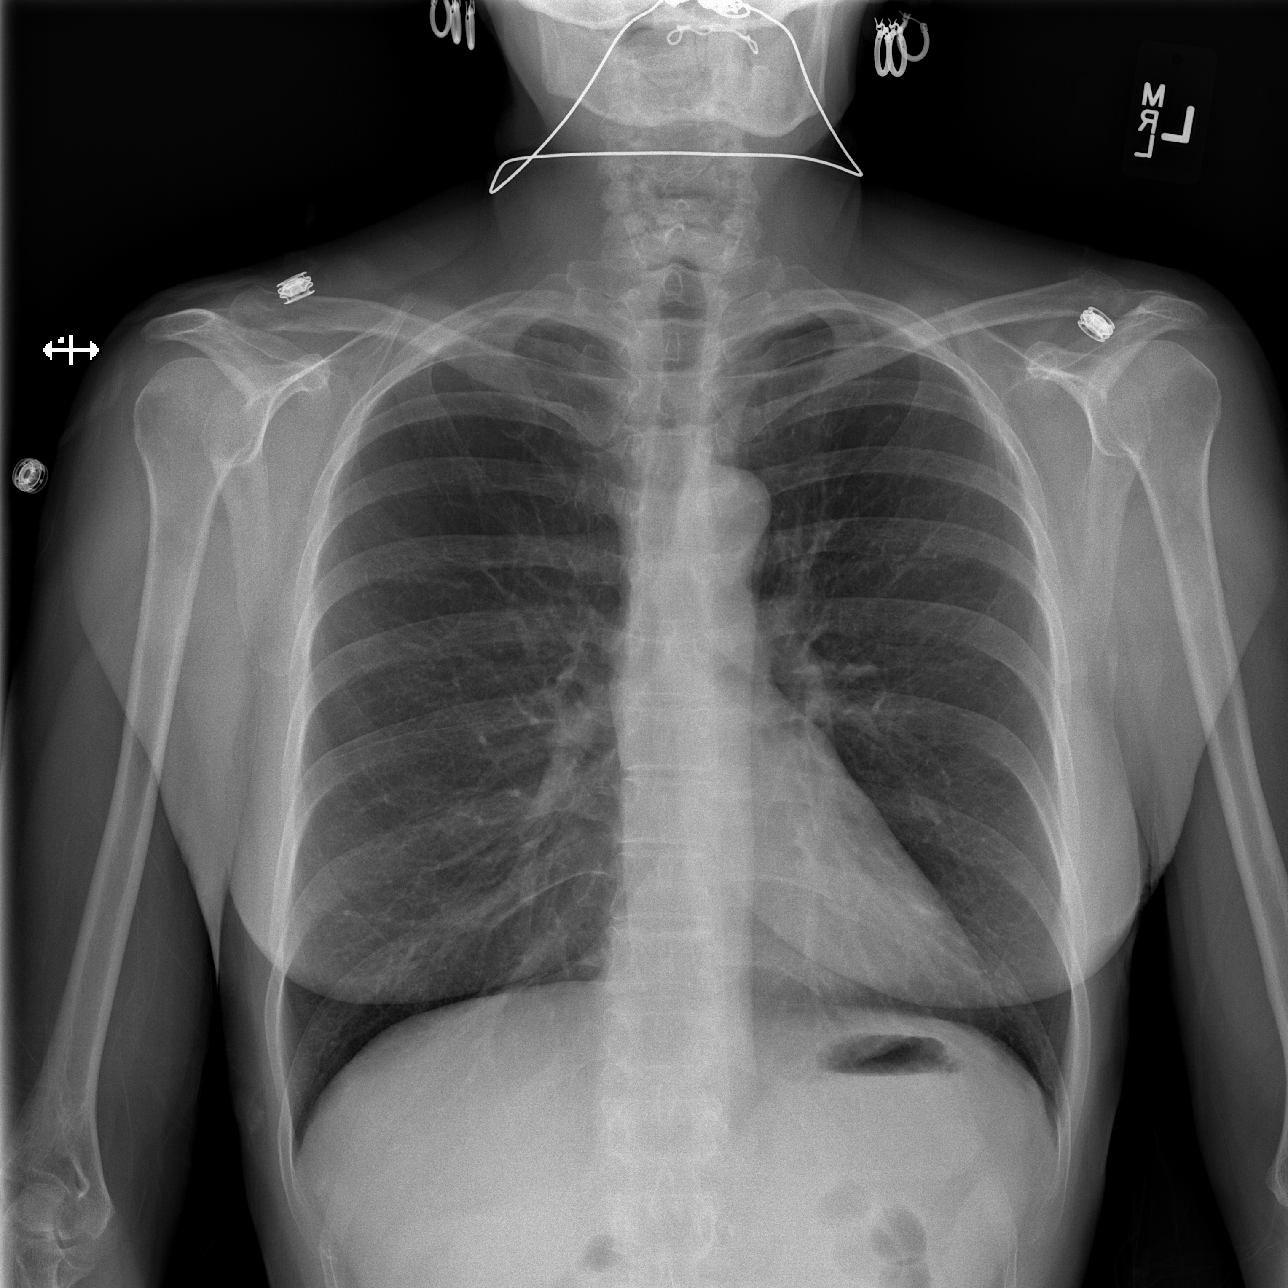

[w abdomen upright]
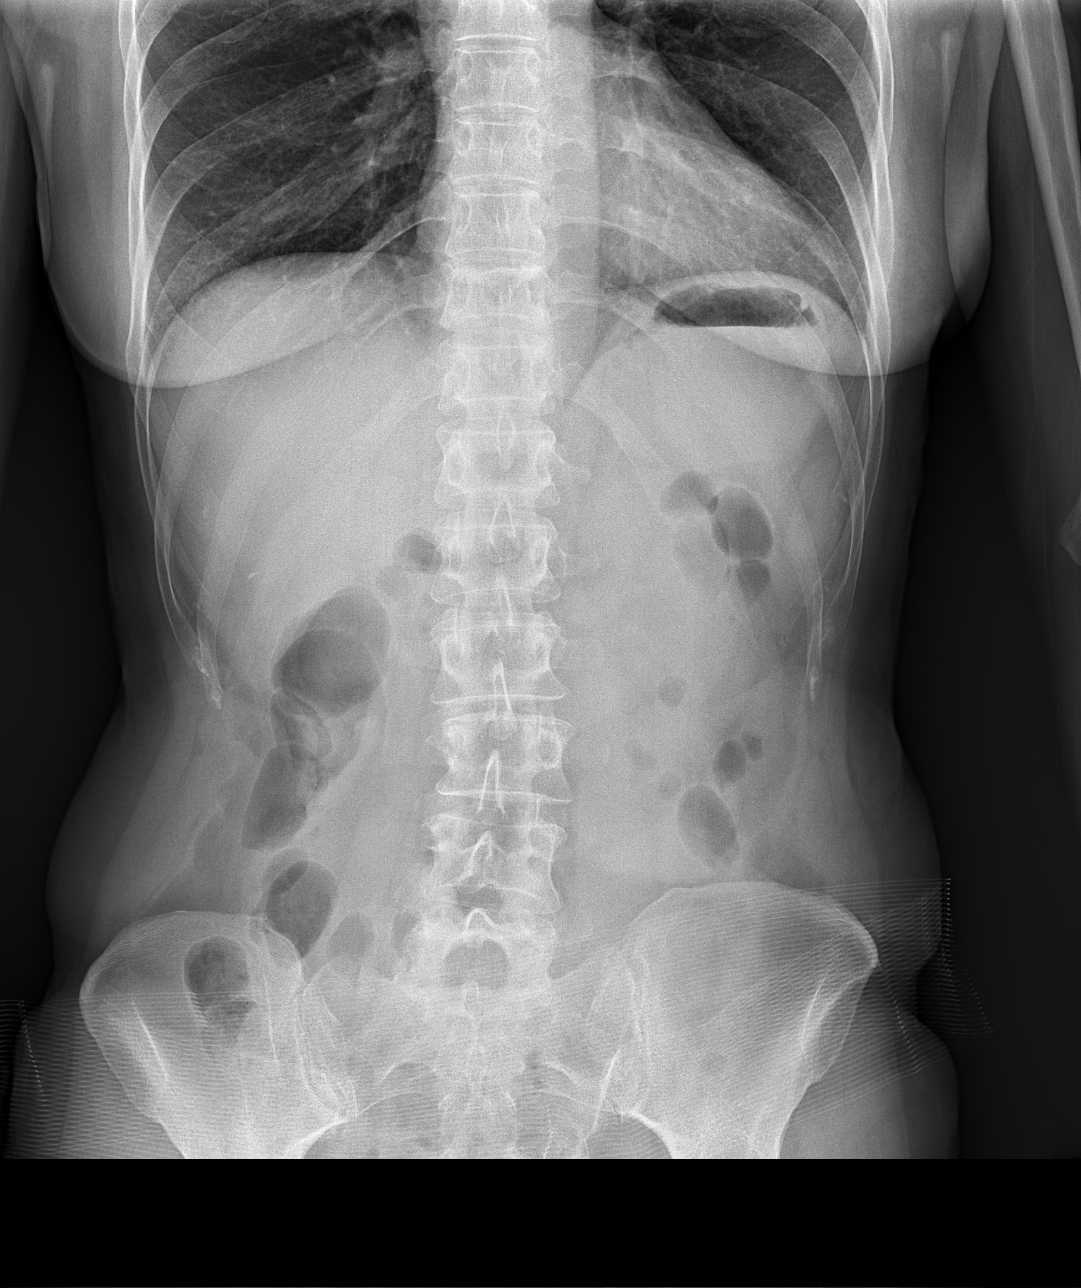

[t abdomen supine]
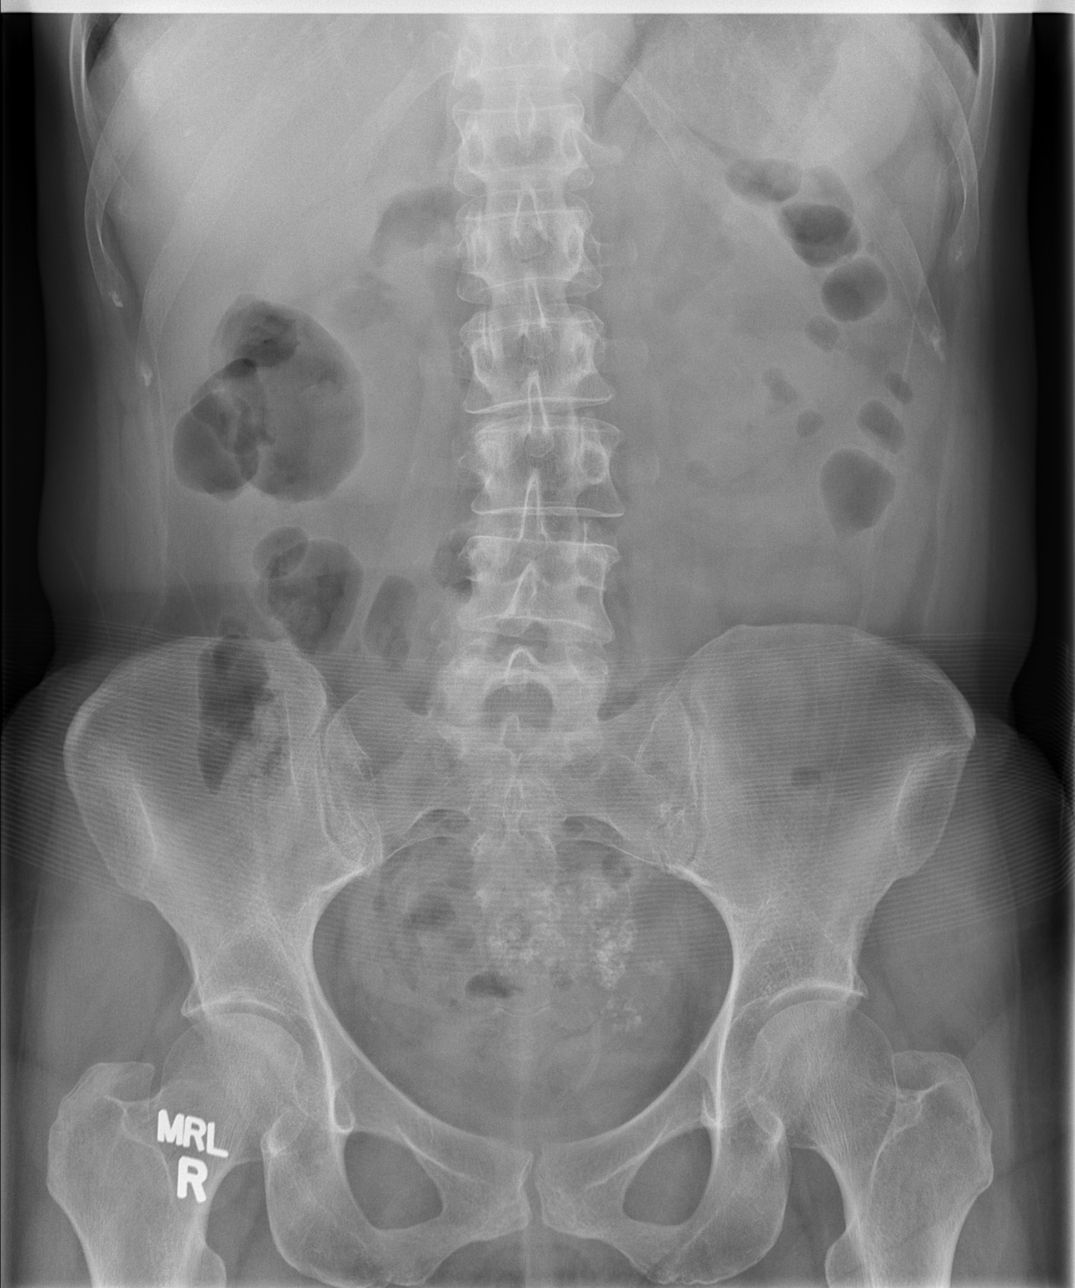

[3 of 3 positions shown; findings below may reference images not displayed]

FINDINGS: The heart size and mediastinal contours are stable.  The
lungs are clear.  There is no pleural effusion or pneumoperitoneum.

The bowel gas pattern is normal.  Calcified uterine fibroids are
again noted.  No new abdominal pelvic calcifications are
identified.  There is a stable mild convex left lumbar scoliosis.
IMPRESSION: No acute cardiopulmonary or abdominal process.  Calcified uterine
fibroids.

## 2011-08-11 IMAGING — CT CT ABD-PELV W/ CM
1 of 3 series · 13 of 32 positions shown, 18 images · IV contrast (APPLIED)
Comparison: CT abdomen pelvis - [DATE]

CLINICAL DATA: Nausea, vomiting, diarrhea and epigastric pain

CT ABDOMEN AND PELVIS WITH CONTRAST
TECHNIQUE: Multidetector CT imaging of the abdomen and pelvis was
performed following the standard protocol during bolus
administration of intravenous contrast.
Contrast: 80mL OMNIPAQUE IOHEXOL 300 MG/ML IV SOLN

[Series 2: abd/pel with · axial · 0.74mm/px · z∈[+722,+1076]mm · 13 of 81 slices shown, 18 images]
[im 5/81  soft-tissue]
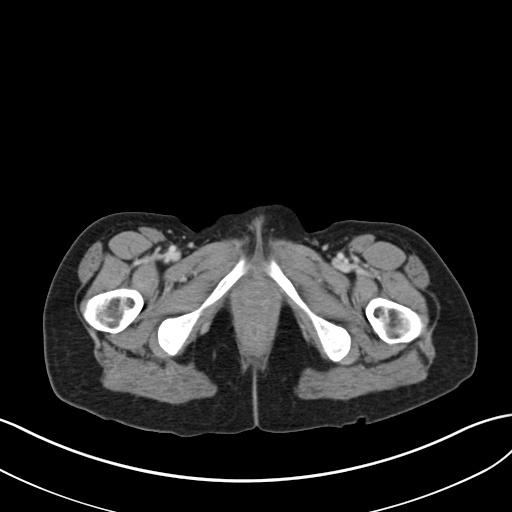
[im 5/81  bone]
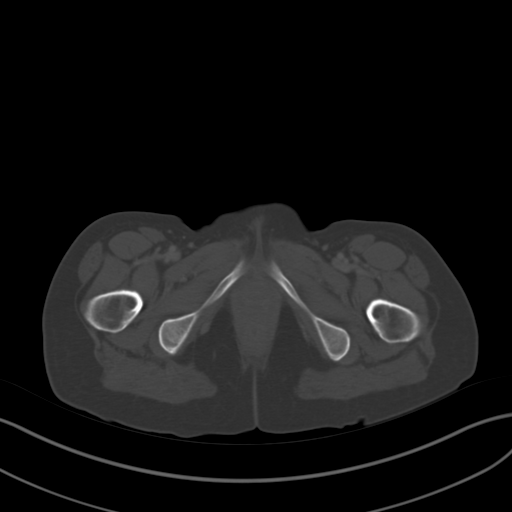
[im 13/81  soft-tissue]
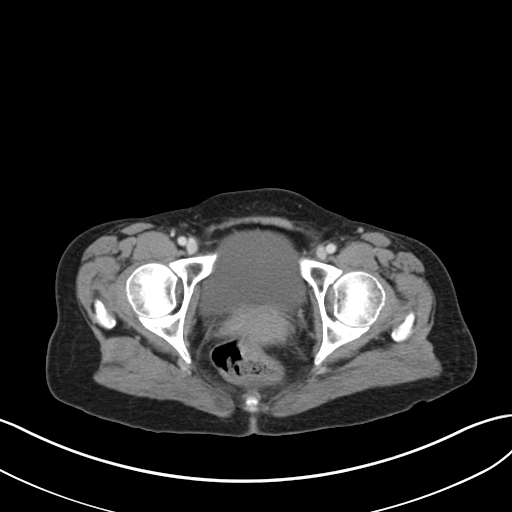
[im 17/81  soft-tissue]
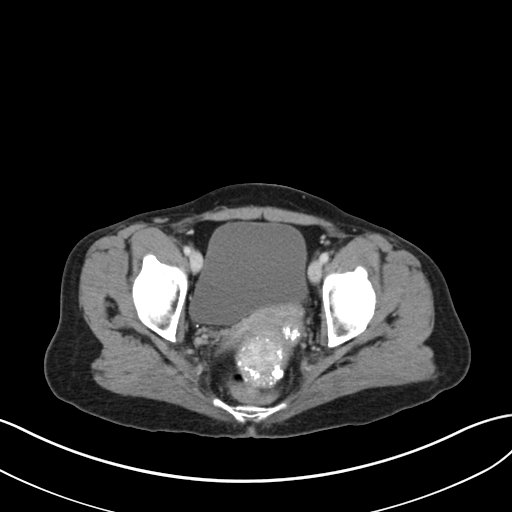
[im 26/81  soft-tissue]
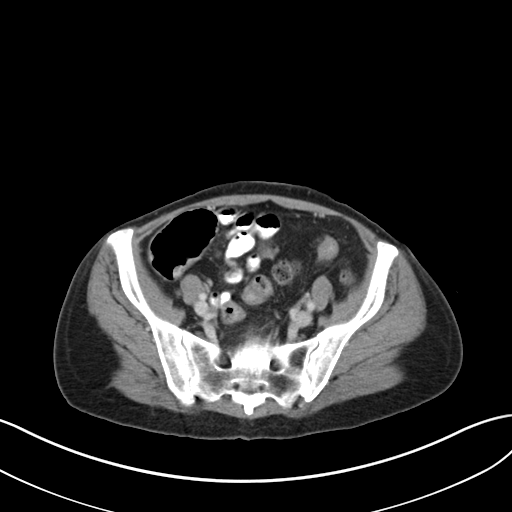
[im 30/81  soft-tissue]
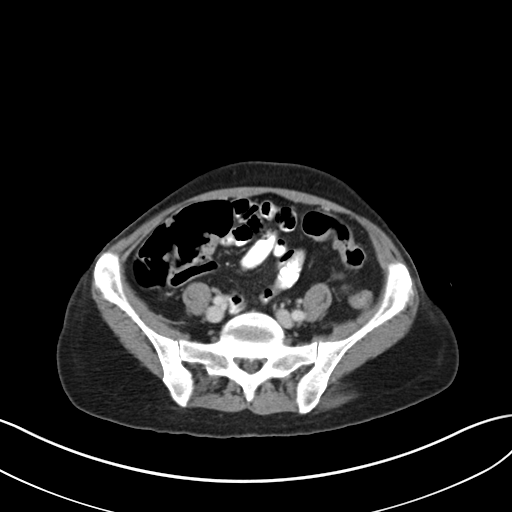
[im 38/81  soft-tissue]
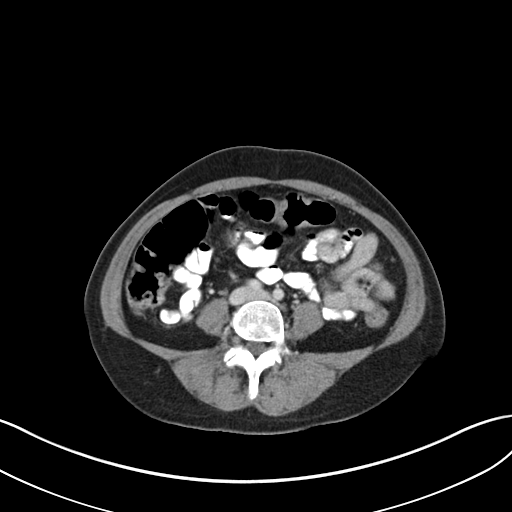
[im 43/81  soft-tissue]
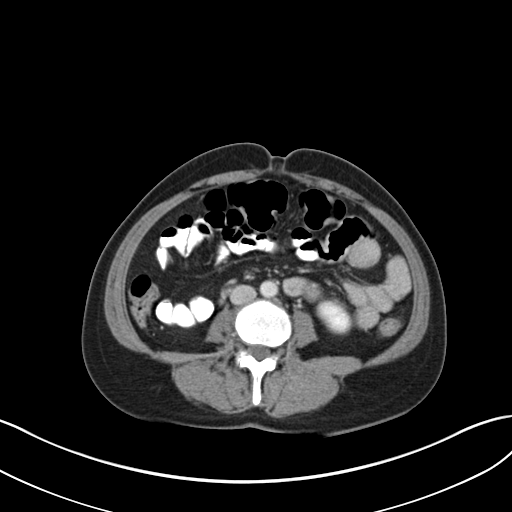
[im 51/81  soft-tissue]
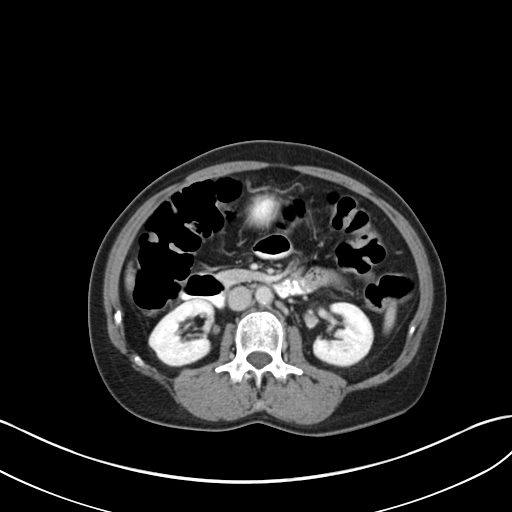
[im 55/81  soft-tissue]
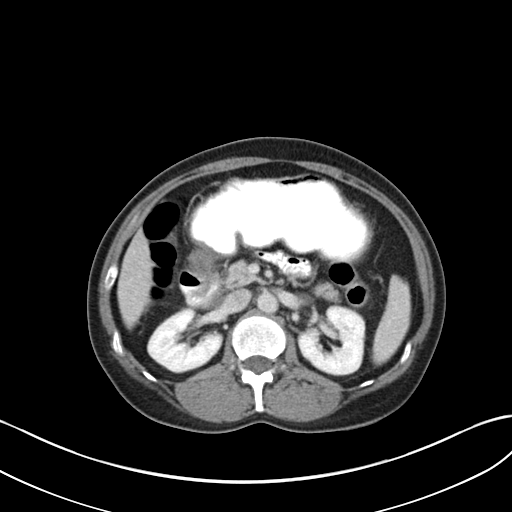
[im 55/81  bone]
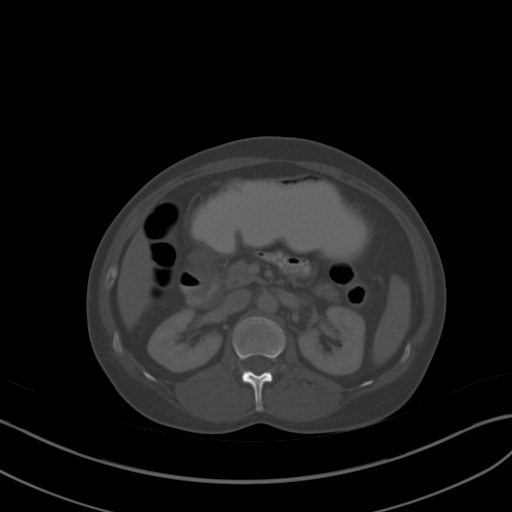
[im 64/81  soft-tissue]
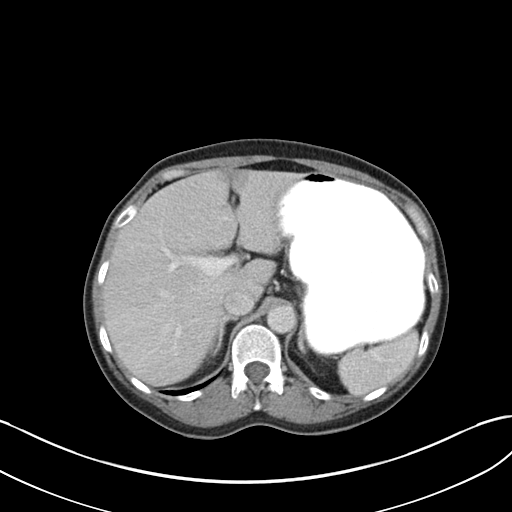
[im 64/81  lung]
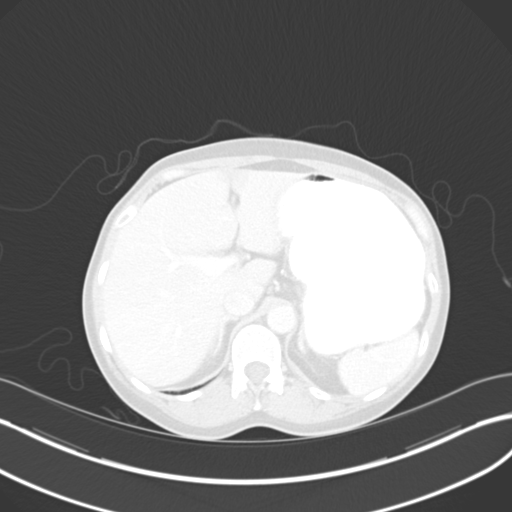
[im 68/81  soft-tissue]
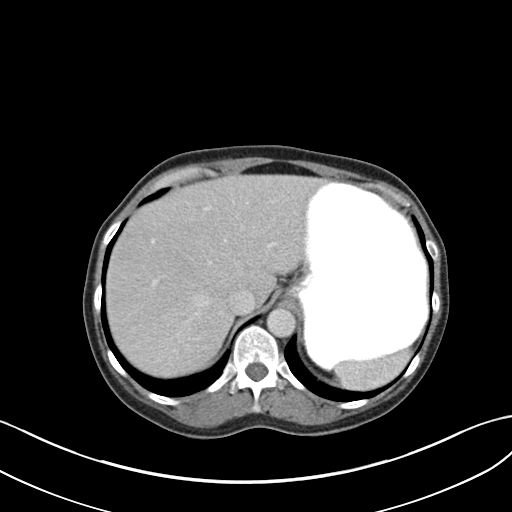
[im 68/81  lung]
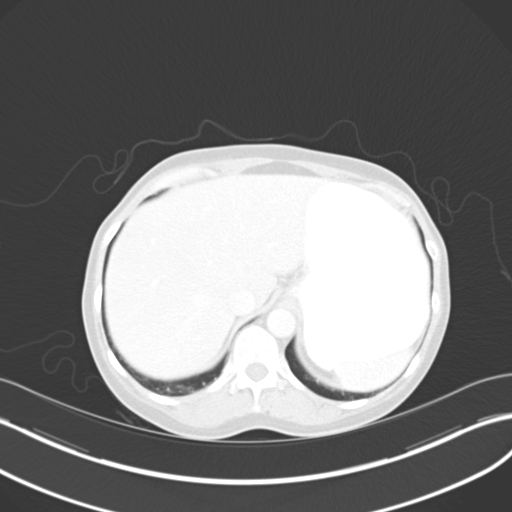
[im 72/81  lung]
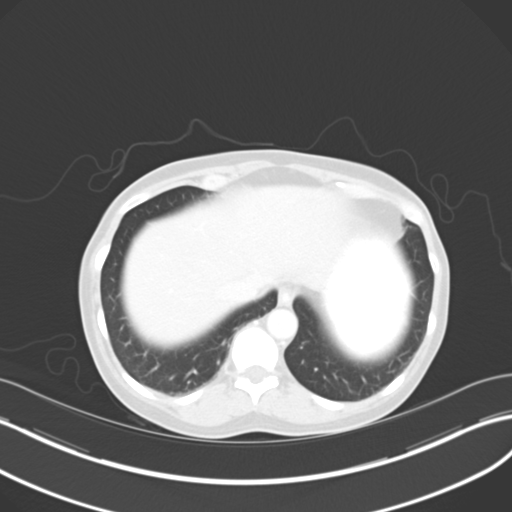
[im 76/81  soft-tissue]
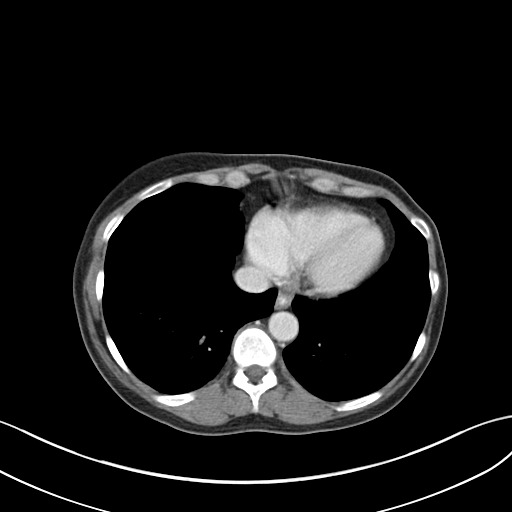
[im 76/81  lung]
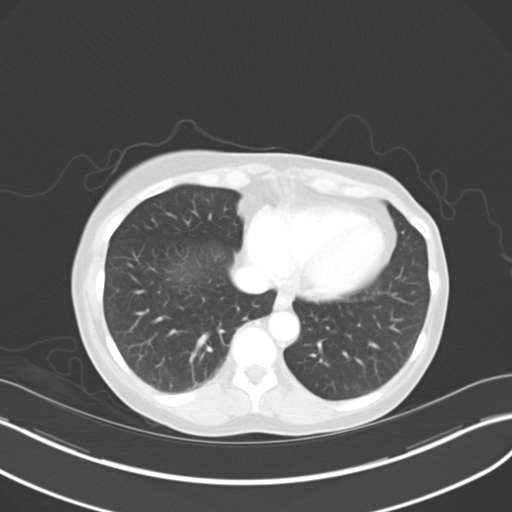

[13 of 32 positions shown; findings below may reference images not displayed]

FINDINGS: Normal hepatic contour.  Geographic area of hypoattenuation
regional to the fissure for the ligamentum teres favored to
represent focal fatty infiltration. The previously identified sub
centimeter hypoattenuating lesion within the inferior aspect of the
right lobe of the liver is not definitively identified on the
present examination.  Normal gallbladder.  No intra or extrahepatic
biliary duct dilatation.  Hepatic vasculature is patent.  No
ascites.

There is symmetric enhancement and excretion of the bilateral
kidneys.  No discrete renal lesions.  No renal stones on this
postcontrast examination.  No evidence of urinary obstruction.  The
bilateral adrenal glands are normal.  Normal pancreas and spleen.

Oral contrast extends to the distal small bowel.  There is mild
gaseous distension of the colon without evidence of enteric
obstruction.  The appendix is not definitely identified.  No
pneumoperitoneum, pneumatosis or portal venous gas.  Minimal
scattered atherosclerotic calcifications of a normal caliber
abdominal aorta.  The major branch vessels of the abdominal aorta
are patent.  No retroperitoneal, mesenteric, pelvic or inguinal
lymphadenopathy.  Calcified fibroids within the uterus.  No free
fluid within the pelvis.

Limited visualization of the lower thorax demonstrates minimal
right basilar ground glass of the atelectasis.  No focal airspace
opacities.  No pleural effusion or pneumothorax.  Normal heart
size.  No pericardial effusion.

No acute aggressive osseous abnormalities.  Unchanged mild DDD of
L2 - L3.
IMPRESSION: 1.  No explanation for patient's nausea, vomiting and epigastric
pain.  Specifically, no evidence of enteric or urinary obstruction.
2.  Calcified uterine fibroids.

## 2011-08-11 MED ORDER — IOHEXOL 300 MG/ML  SOLN
100.0000 mL | Freq: Once | INTRAMUSCULAR | Status: AC | PRN
  Administered 2011-08-11: 80 mL via INTRAVENOUS

## 2011-08-14 LAB — URINE CULTURE
Colony Count: 45000
Culture  Setup Time: 201210170053

## 2011-08-17 ENCOUNTER — Emergency Department (HOSPITAL_COMMUNITY)
Admission: EM | Admit: 2011-08-17 | Discharge: 2011-08-17 | Disposition: A | Discharge status: Home / Self Care | Payer: Medicare Other | Attending: Emergency Medicine | Admitting: Emergency Medicine

## 2011-08-17 ENCOUNTER — Emergency Department (HOSPITAL_COMMUNITY): Payer: Medicare Other

## 2011-08-17 DIAGNOSIS — Z79899 Other long term (current) drug therapy: Secondary | ICD-10-CM | POA: Insufficient documentation

## 2011-08-17 DIAGNOSIS — F3289 Other specified depressive episodes: Secondary | ICD-10-CM | POA: Insufficient documentation

## 2011-08-17 DIAGNOSIS — F411 Generalized anxiety disorder: Secondary | ICD-10-CM | POA: Insufficient documentation

## 2011-08-17 DIAGNOSIS — F329 Major depressive disorder, single episode, unspecified: Secondary | ICD-10-CM | POA: Insufficient documentation

## 2011-08-17 DIAGNOSIS — F988 Other specified behavioral and emotional disorders with onset usually occurring in childhood and adolescence: Secondary | ICD-10-CM | POA: Insufficient documentation

## 2011-08-17 DIAGNOSIS — G894 Chronic pain syndrome: Secondary | ICD-10-CM | POA: Insufficient documentation

## 2011-08-17 DIAGNOSIS — R1013 Epigastric pain: Secondary | ICD-10-CM | POA: Insufficient documentation

## 2011-08-17 LAB — COMPREHENSIVE METABOLIC PANEL
AST: 21 U/L (ref 0–37)
Albumin: 3.9 g/dL (ref 3.5–5.2)
Calcium: 10.2 mg/dL (ref 8.4–10.5)
Creatinine, Ser: 0.63 mg/dL (ref 0.50–1.10)
GFR calc non Af Amer: >90 mL/min (ref >90)

## 2011-08-17 LAB — URINALYSIS, ROUTINE W REFLEX MICROSCOPIC
Glucose, UA: NEGATIVE mg/dL
Leukocytes, UA: NEGATIVE
Nitrite: NEGATIVE
Protein, ur: NEGATIVE mg/dL
pH: 8 (ref 5.0–8.0)

## 2011-08-17 LAB — DIFFERENTIAL
Basophils Absolute: 0.1 10*3/uL (ref 0.0–0.1)
Eosinophils Absolute: 0.1 10*3/uL (ref 0.0–0.7)
Lymphs Abs: 3.8 10*3/uL (ref 0.7–4.0)
Neutrophils Relative %: 56 % (ref 43–77)

## 2011-08-17 LAB — CBC
MCV: 89.6 fL (ref 78.0–100.0)
Platelets: 249 10*3/uL (ref 150–400)
RBC: 5 MIL/uL (ref 3.87–5.11)
RDW: 12.6 % (ref 11.5–15.5)
WBC: 10.6 10*3/uL — ABNORMAL HIGH (ref 4.0–10.5)

## 2011-08-17 LAB — LIPASE, BLOOD: Lipase: 31 U/L (ref 11–59)

## 2011-08-17 IMAGING — CR DG ABDOMEN ACUTE W/ 1V CHEST
3 series · 3 of 3 positions shown · non-contrast
Comparison: CT scan from [DATE]

CLINICAL DATA: Abdominal pain.

ACUTE ABDOMEN SERIES (ABDOMEN 2 VIEW & CHEST 1 VIEW)

[w chest pa]
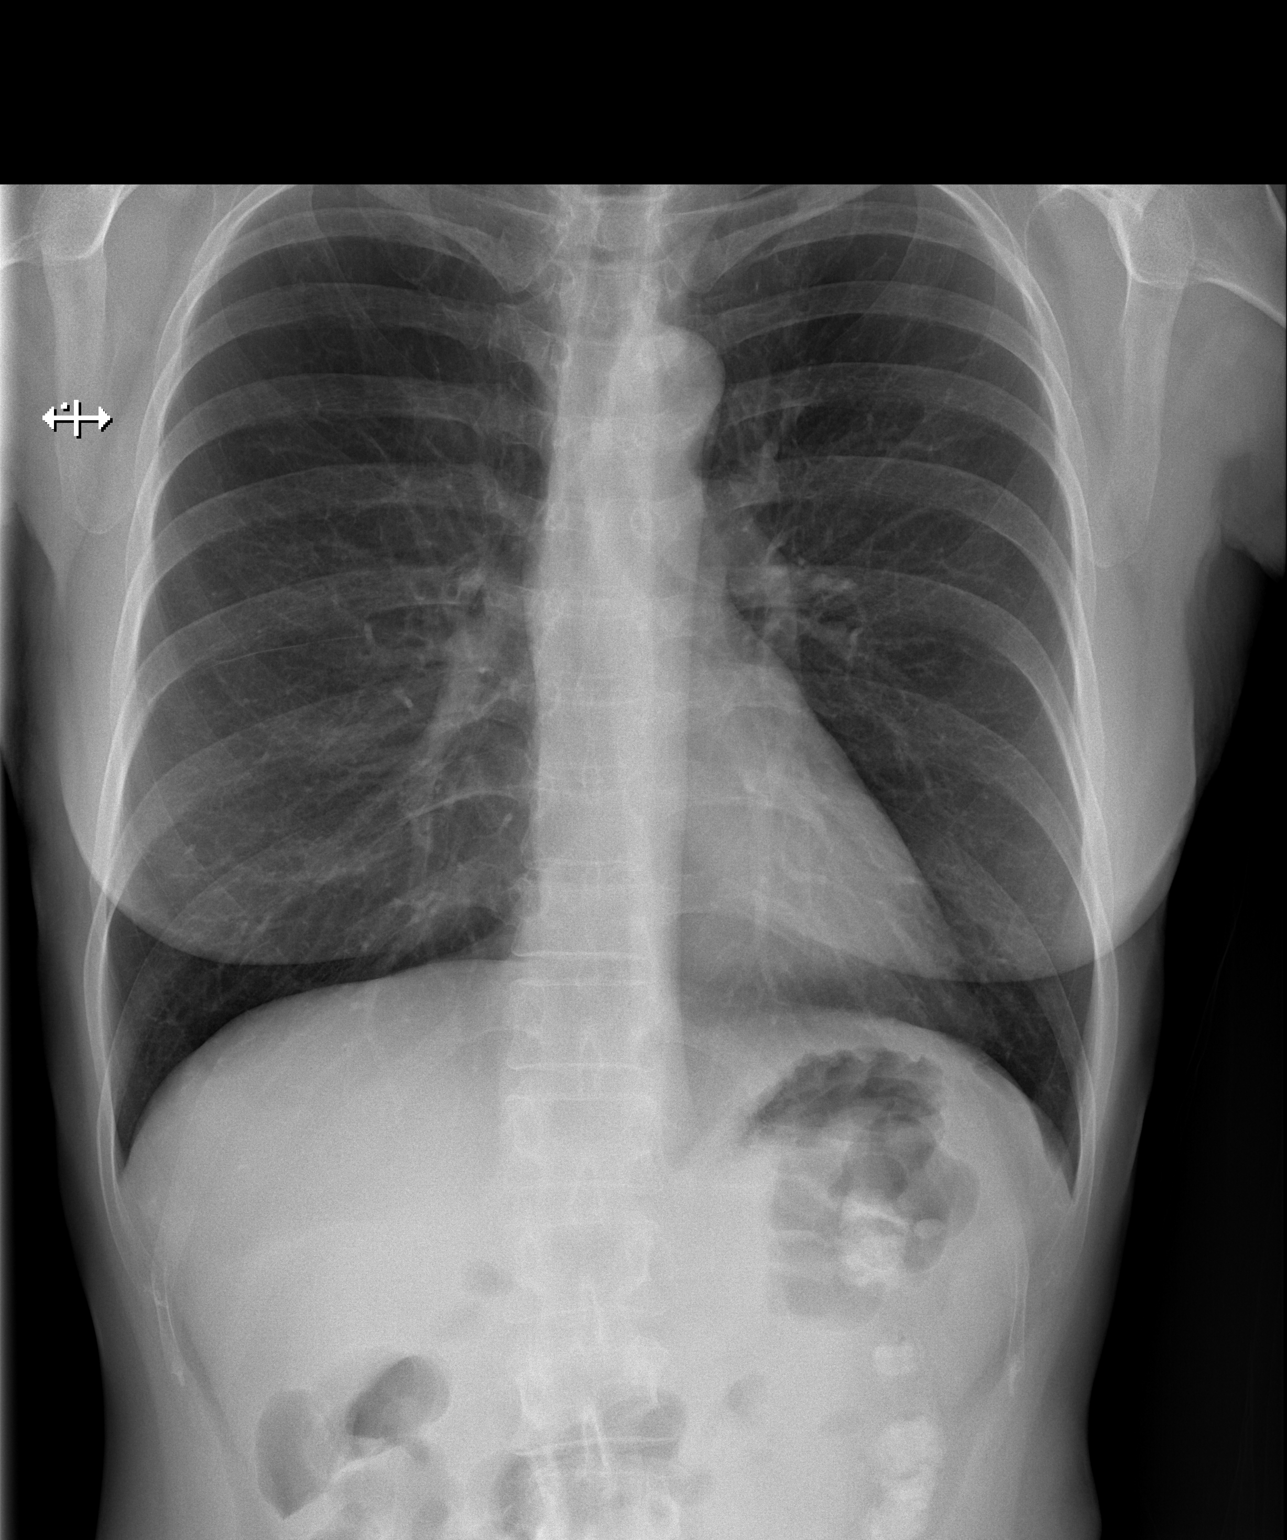

[w abdomen upright]
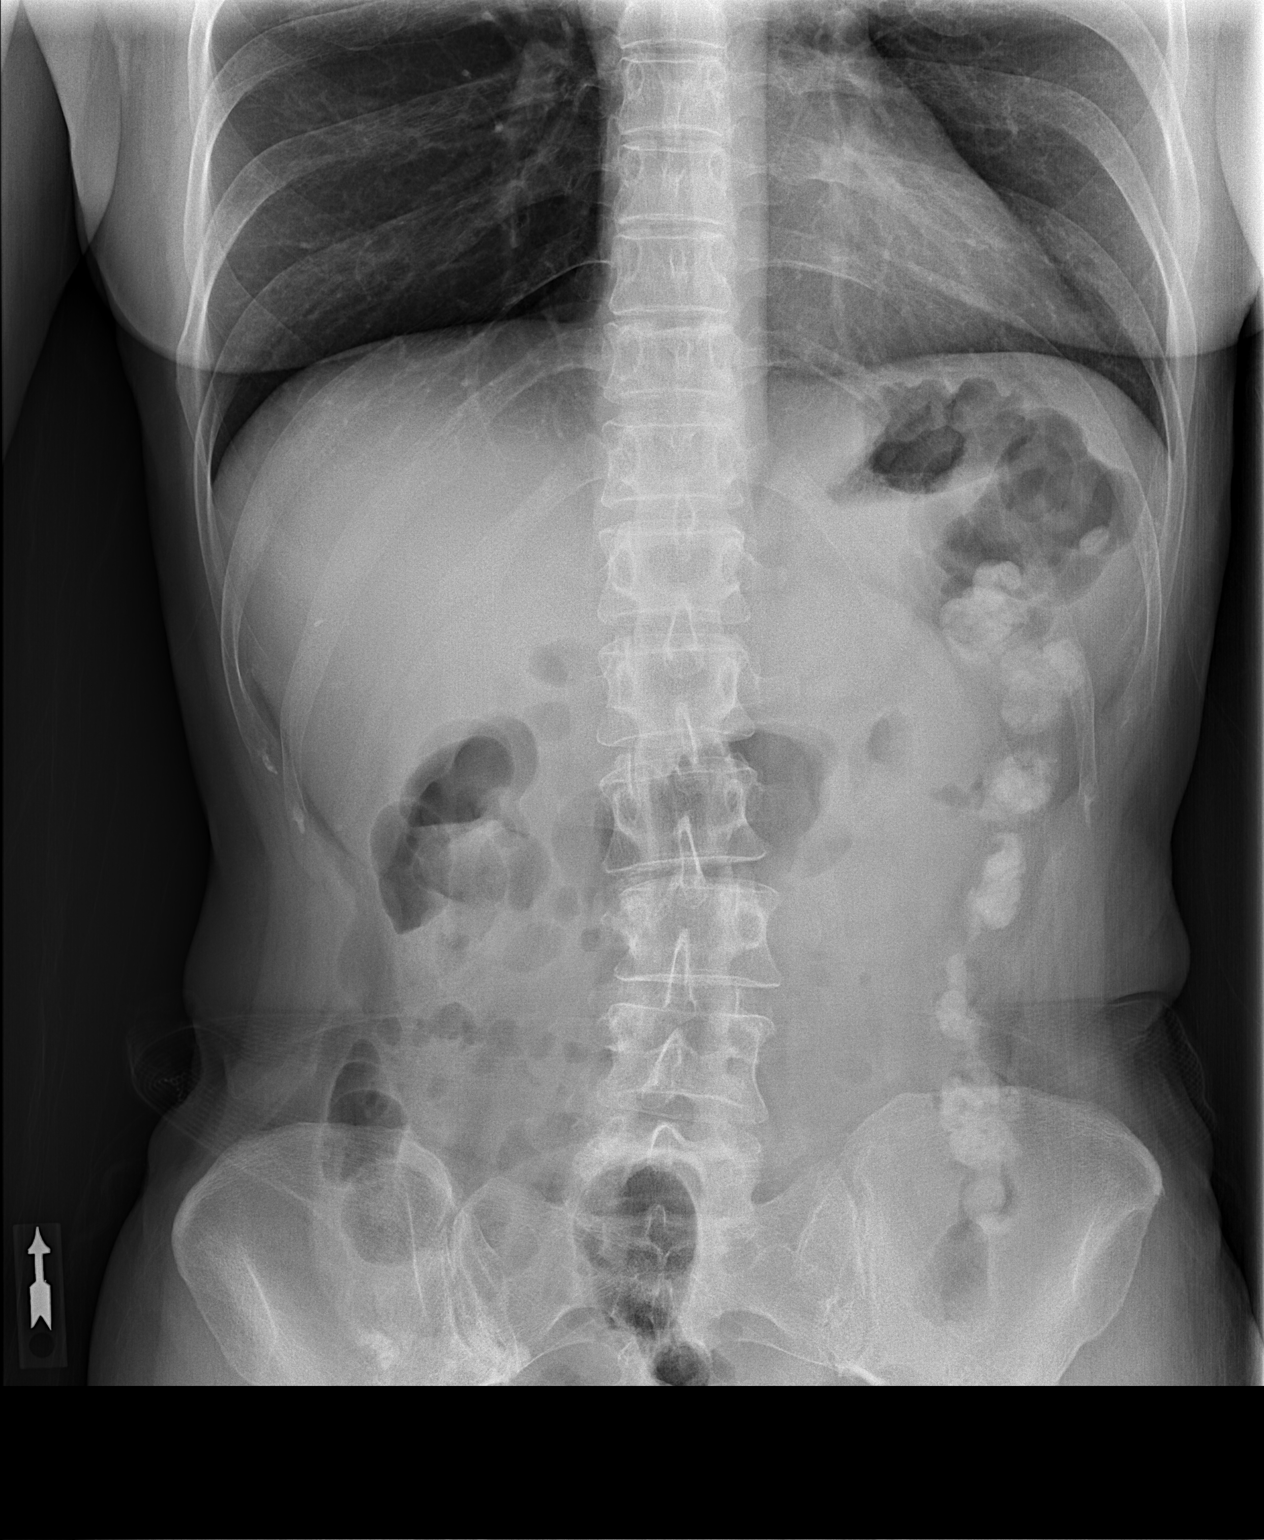

[t abdomen supine]
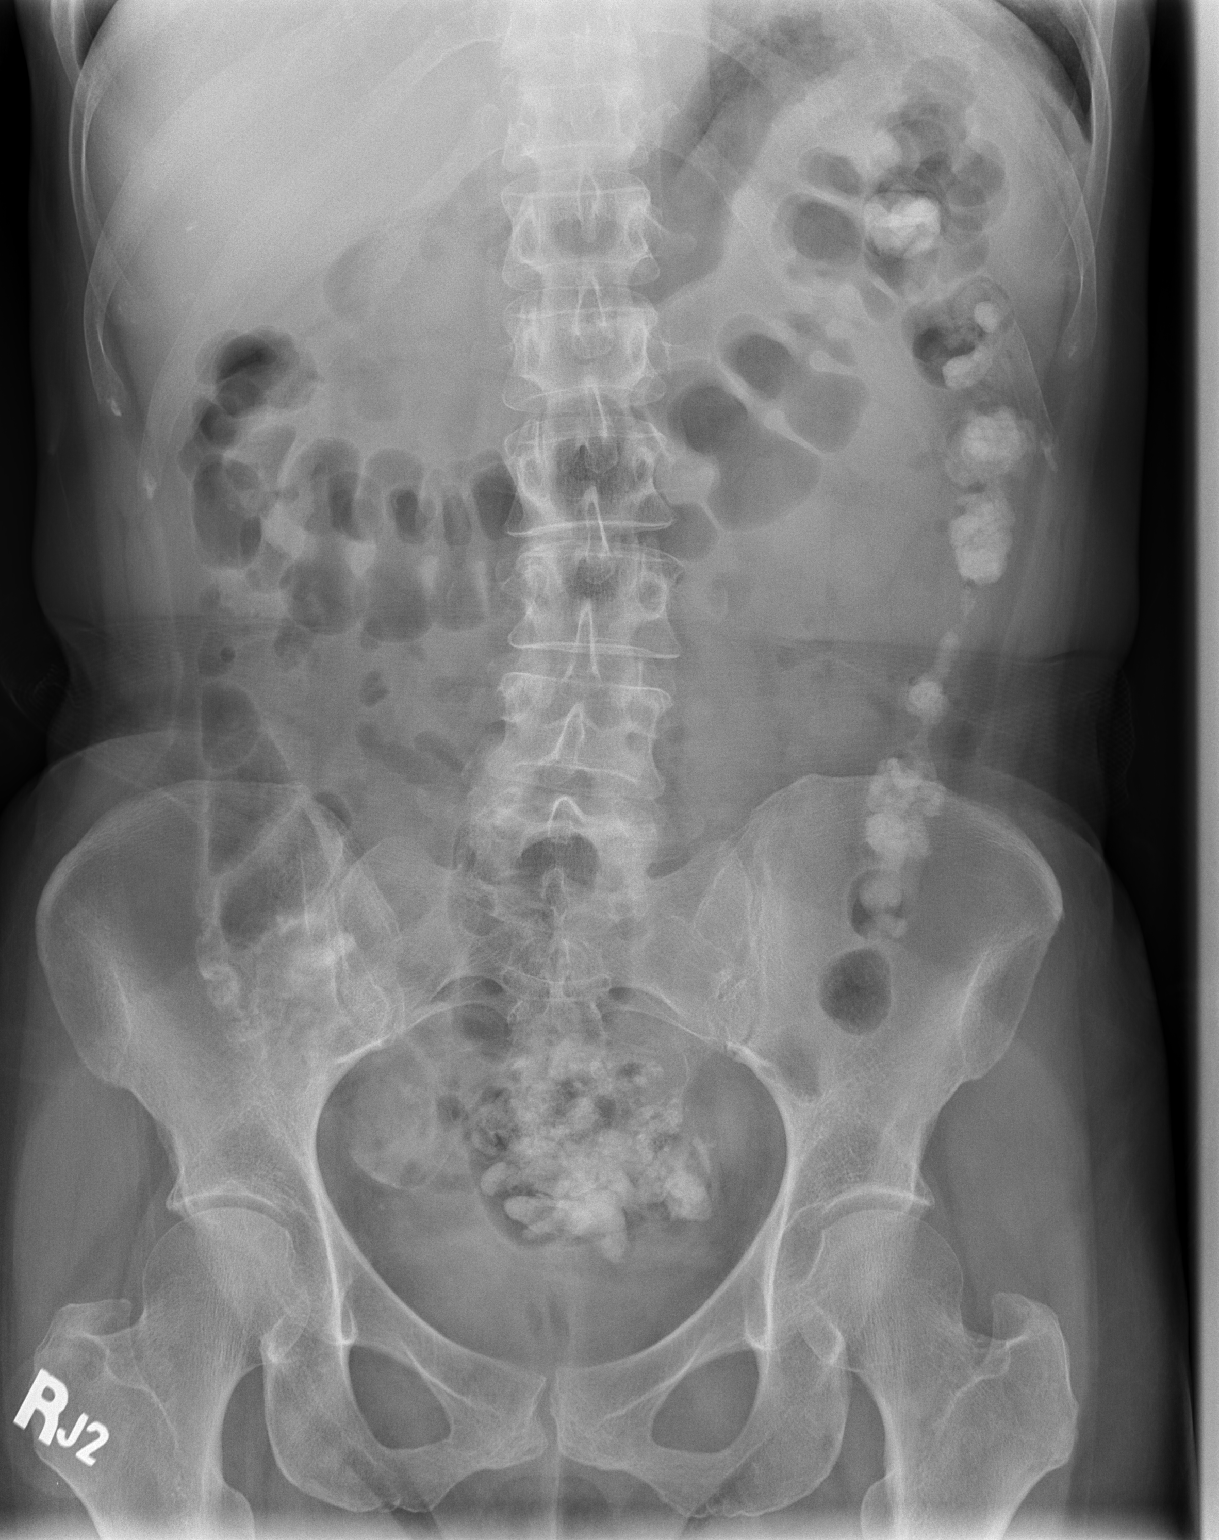

[3 of 3 positions shown; findings below may reference images not displayed]

FINDINGS: The lungs are clear without focal consolidation, edema,
effusion or pneumothorax.  Cardiopericardial silhouette is within
normal limits for size.  Imaged bony structures of the thorax are
intact.

Upright film shows no evidence for intraperitoneal free air. There
is no evidence for gaseous bowel dilation to suggest obstruction.
Contrast material in the colon is consistent with the recent CT
scan.  Visualized bony structures are unremarkable.
IMPRESSION: Normal chest x-ray.

No evidence for intraperitoneal free air or bowel obstruction.

## 2011-08-19 ENCOUNTER — Emergency Department (HOSPITAL_COMMUNITY): Payer: Medicare Other

## 2011-08-19 ENCOUNTER — Emergency Department (HOSPITAL_COMMUNITY)
Admission: EM | Admit: 2011-08-19 | Discharge: 2011-08-20 | Disposition: A | Discharge status: Home / Self Care | Payer: Medicare Other | Attending: Emergency Medicine | Admitting: Emergency Medicine

## 2011-08-19 DIAGNOSIS — F988 Other specified behavioral and emotional disorders with onset usually occurring in childhood and adolescence: Secondary | ICD-10-CM | POA: Insufficient documentation

## 2011-08-19 DIAGNOSIS — G8929 Other chronic pain: Secondary | ICD-10-CM | POA: Insufficient documentation

## 2011-08-19 DIAGNOSIS — R109 Unspecified abdominal pain: Secondary | ICD-10-CM | POA: Insufficient documentation

## 2011-08-19 DIAGNOSIS — F341 Dysthymic disorder: Secondary | ICD-10-CM | POA: Insufficient documentation

## 2011-08-19 DIAGNOSIS — R112 Nausea with vomiting, unspecified: Secondary | ICD-10-CM | POA: Insufficient documentation

## 2011-08-19 DIAGNOSIS — Z79899 Other long term (current) drug therapy: Secondary | ICD-10-CM | POA: Insufficient documentation

## 2011-08-19 LAB — COMPREHENSIVE METABOLIC PANEL
Albumin: 4.1 g/dL (ref 3.5–5.2)
Alkaline Phosphatase: 84 U/L (ref 39–117)
BUN: 10 mg/dL (ref 6–23)
CO2: 27 mEq/L (ref 19–32)
Chloride: 98 mEq/L (ref 96–112)
Creatinine, Ser: 0.52 mg/dL (ref 0.50–1.10)
GFR calc non Af Amer: >90 mL/min (ref >90)
Glucose, Bld: 116 mg/dL — ABNORMAL HIGH (ref 70–99)
Potassium: 3.1 mEq/L — ABNORMAL LOW (ref 3.5–5.1)
Total Bilirubin: 0.3 mg/dL (ref 0.3–1.2)

## 2011-08-19 LAB — CBC
HCT: 44.6 % (ref 36.0–46.0)
Hemoglobin: 15.6 g/dL — ABNORMAL HIGH (ref 12.0–15.0)
MCV: 88.5 fL (ref 78.0–100.0)
RBC: 5.04 MIL/uL (ref 3.87–5.11)
WBC: 11.1 10*3/uL — ABNORMAL HIGH (ref 4.0–10.5)

## 2011-08-19 LAB — URINALYSIS, ROUTINE W REFLEX MICROSCOPIC
Bilirubin Urine: NEGATIVE
Glucose, UA: NEGATIVE mg/dL
Ketones, ur: 15 mg/dL — AB
Leukocytes, UA: NEGATIVE
Nitrite: NEGATIVE
Protein, ur: NEGATIVE mg/dL
pH: 7.5 (ref 5.0–8.0)

## 2011-08-19 LAB — DIFFERENTIAL
Basophils Absolute: 0.1 10*3/uL (ref 0.0–0.1)
Eosinophils Relative: 1 % (ref 0–5)
Lymphocytes Relative: 27 % (ref 12–46)
Lymphs Abs: 3 10*3/uL (ref 0.7–4.0)
Neutro Abs: 7.2 10*3/uL (ref 1.7–7.7)
Neutrophils Relative %: 66 % (ref 43–77)

## 2011-08-19 LAB — LIPASE, BLOOD: Lipase: 29 U/L (ref 11–59)

## 2011-08-19 IMAGING — CR DG CHEST 2V
2 series · 2 of 2 positions shown · non-contrast
Comparison: [DATE].

CLINICAL DATA: Abdominal pain.  Vomiting.

CHEST - 2 VIEW

[w chest pa]
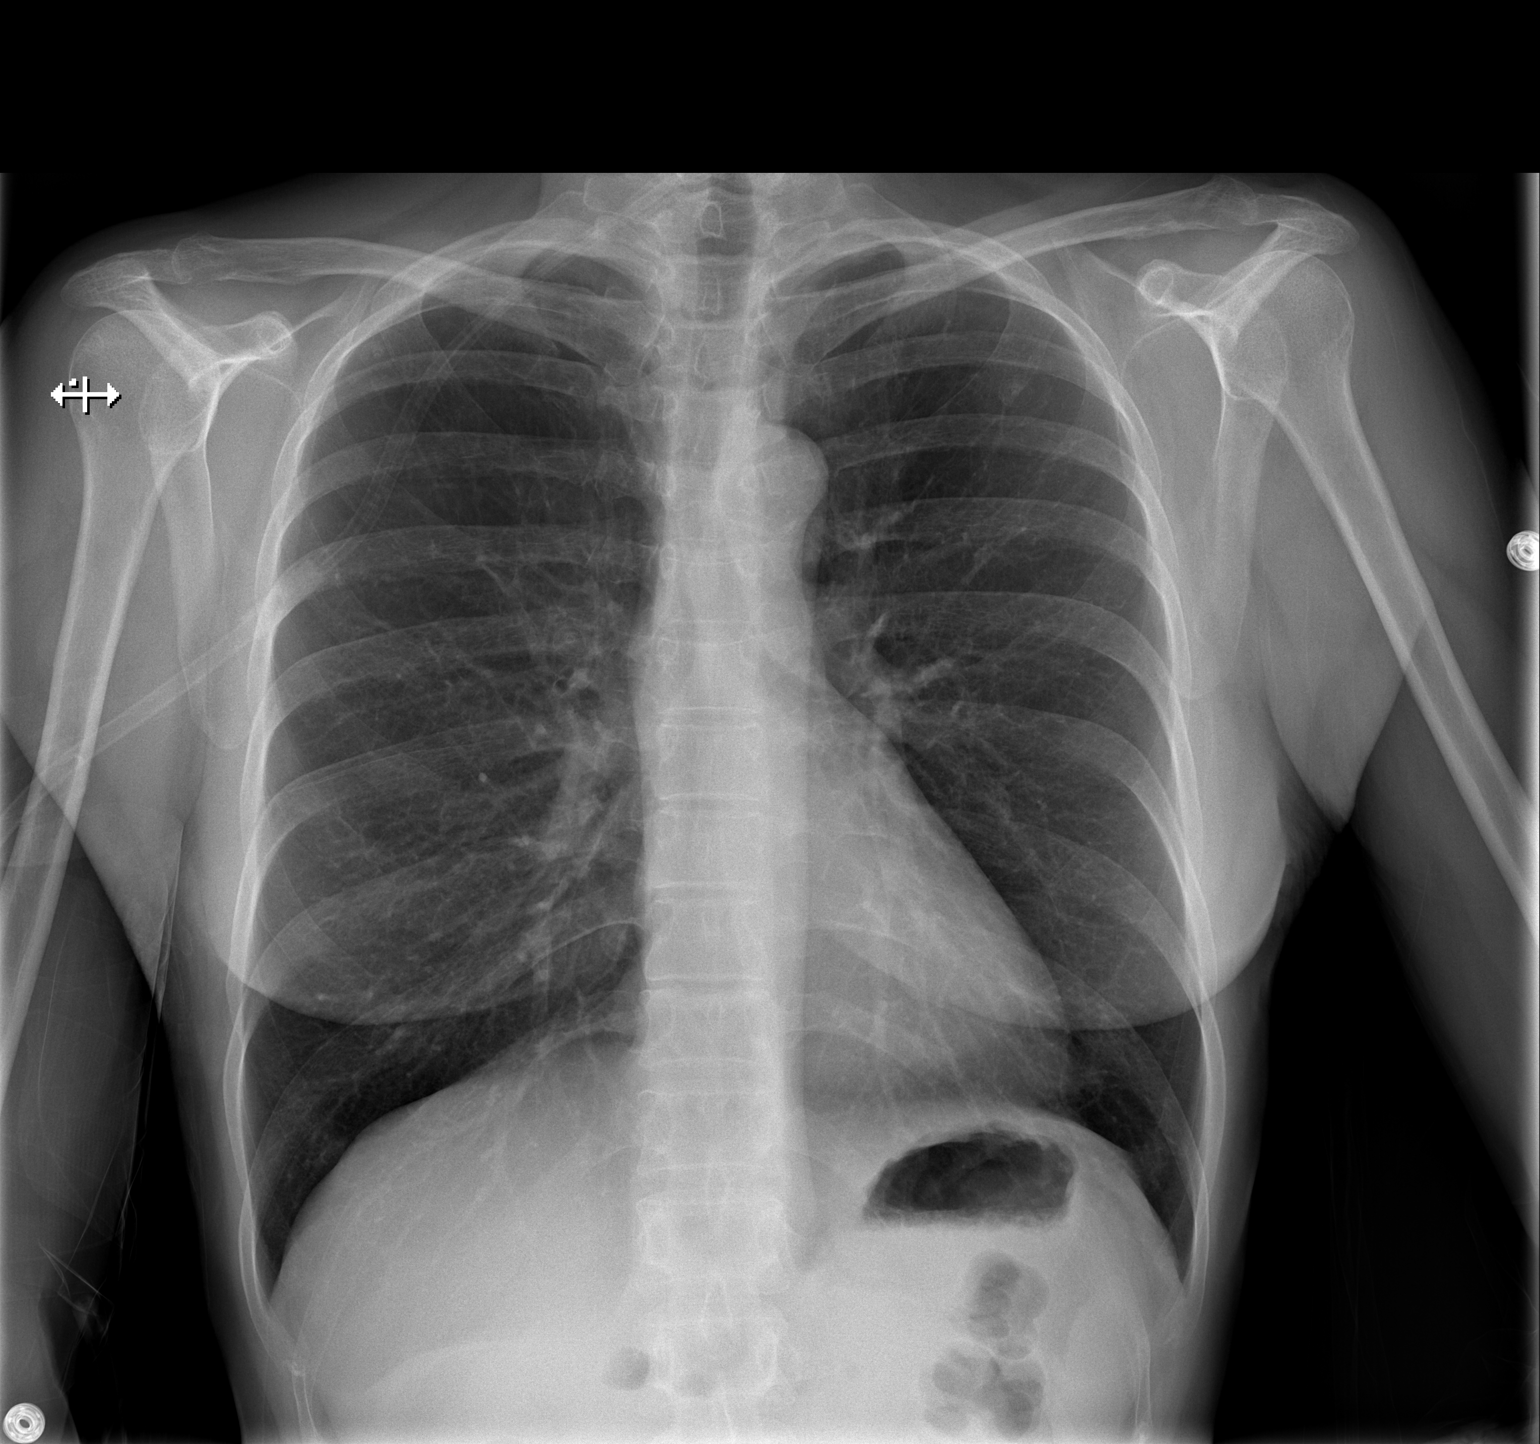

[w chest lat]
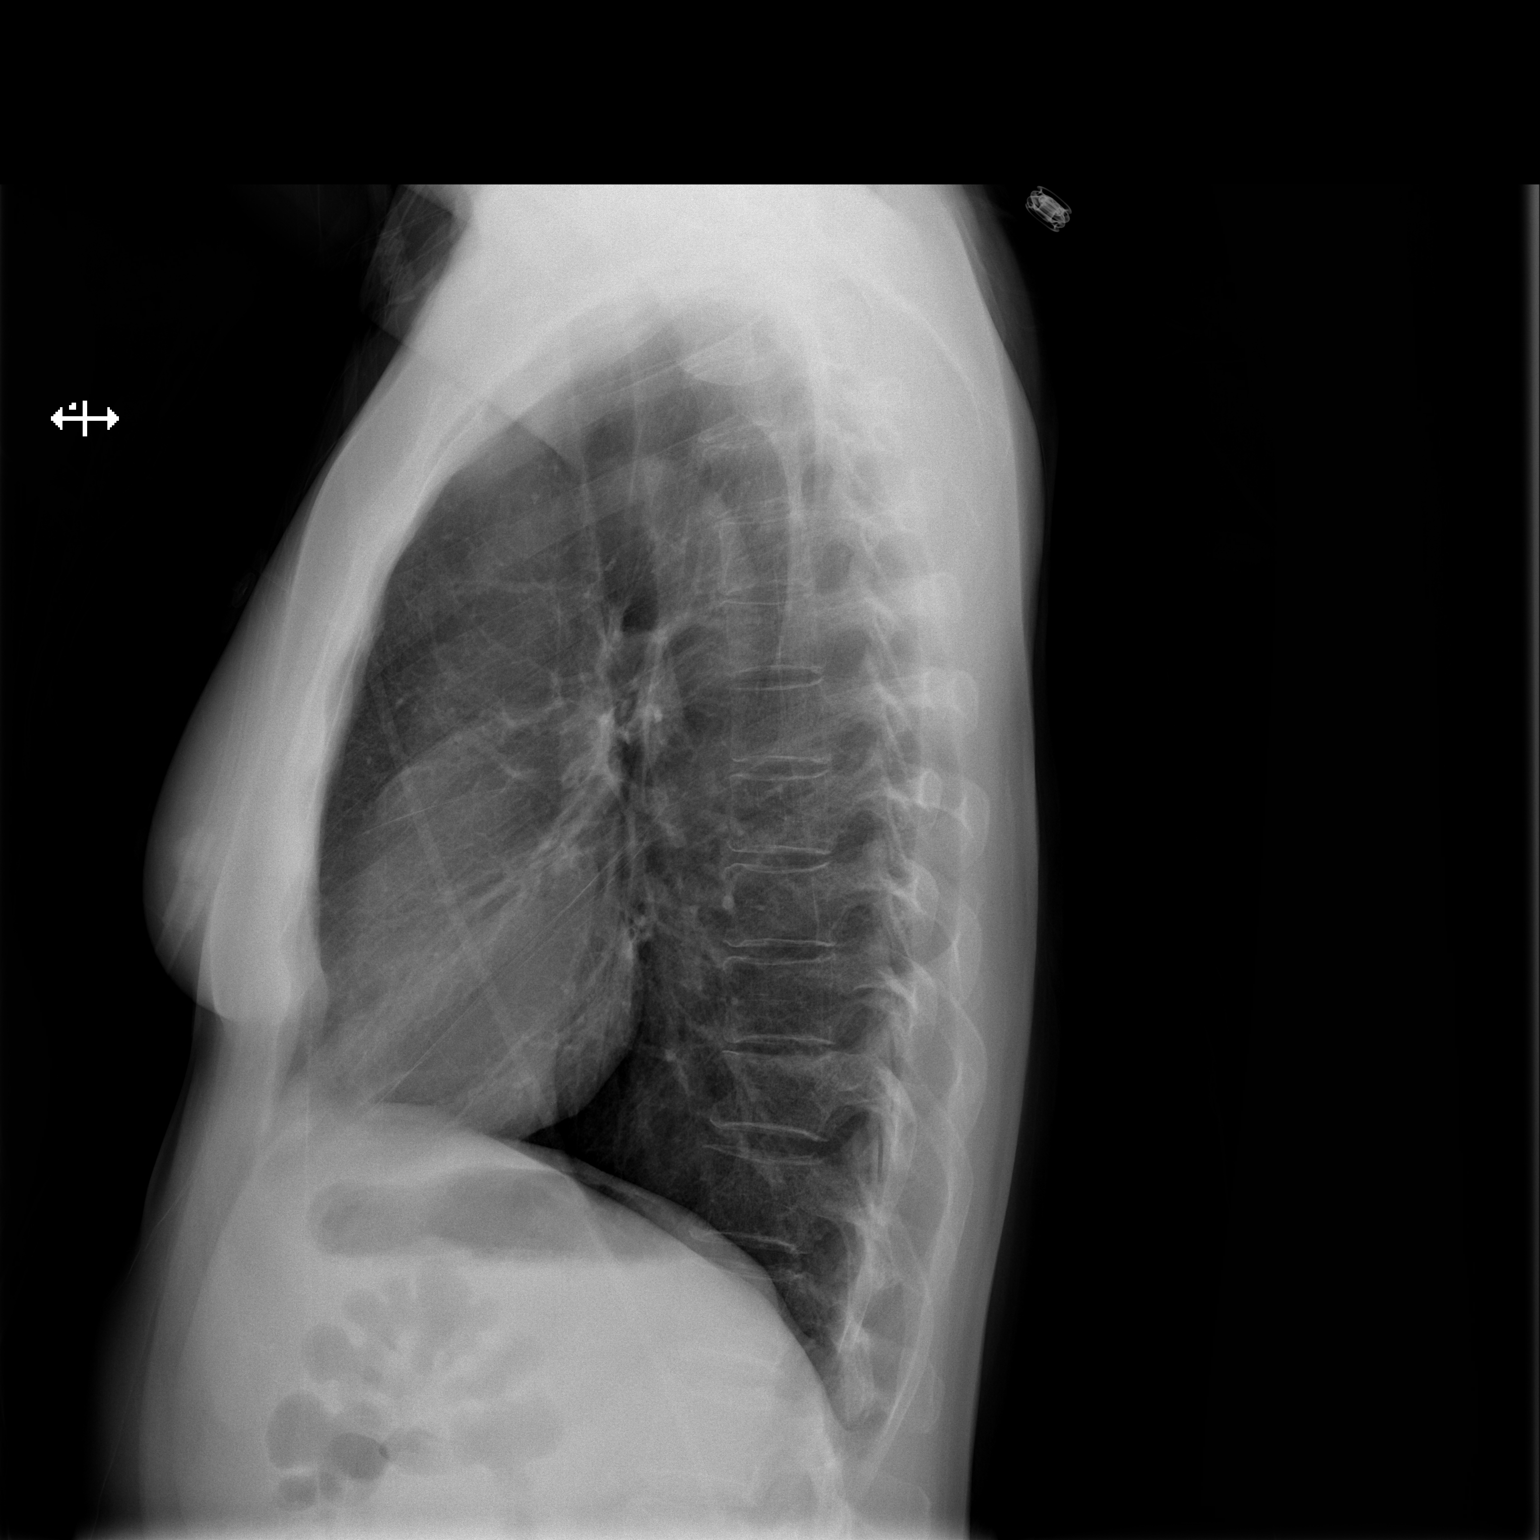

[2 of 2 positions shown; findings below may reference images not displayed]

FINDINGS: The cardiopericardial silhouette and mediastinal contours
are within normal limits.  Partially radiopaque monitoring buttons
are projected over the upper chest.  No airspace disease.  No
effusion. Hyperinflation is present.
IMPRESSION: Hyperinflation.  Question asthma or emphysema.

## 2011-08-21 ENCOUNTER — Telehealth: Payer: Self-pay | Admitting: Gastroenterology

## 2011-08-21 NOTE — Telephone Encounter (Signed)
I have left a message with Malachi Bonds to have her call back to confirm appt for 08/24/11 1:30 with Amy Esterwood PA .  She is asked to fax records asap.

## 2011-08-24 ENCOUNTER — Ambulatory Visit: Payer: Medicare Other | Admitting: Physician Assistant

## 2011-08-31 ENCOUNTER — Encounter: Payer: Self-pay | Admitting: Gastroenterology

## 2011-08-31 ENCOUNTER — Ambulatory Visit (INDEPENDENT_AMBULATORY_CARE_PROVIDER_SITE_OTHER): Payer: Medicare Other | Admitting: Gastroenterology

## 2011-08-31 VITALS — BP 112/62 | HR 68 | Ht 59.0 in | Wt 101.0 lb

## 2011-08-31 DIAGNOSIS — R112 Nausea with vomiting, unspecified: Secondary | ICD-10-CM

## 2011-08-31 DIAGNOSIS — R634 Abnormal weight loss: Secondary | ICD-10-CM

## 2011-08-31 MED ORDER — PROMETHAZINE HCL 25 MG PO TABS: 25.0000 mg | ORAL_TABLET | Freq: Four times a day (QID) | ORAL | Status: DC | PRN

## 2011-08-31 NOTE — Progress Notes (Signed)
History of Present Illness: This is a 55 year old female who complaining of the acute onset of nausea and vomiting 2 weeks ago. She was seen in the emergency department and was diagnosed with a kidney infection. She was treated with antibiotics and her nausea and vomiting resolved. She had an episode of nausea and vomiting this morning the first time in about 5 days. She has prior history of nausea and vomiting related to pyloric stenosis and anxiety. She has chronic back pain managed with morphine and hydrocodone She states she's been under more stress recently with her elderly father returning to live with her. She states she lost 12 pounds over the past 2 weeks. She states she takes Zegerid OTC intermittently. She was placed on Dexilant for 2 weeks by Dr. Evlyn Kanner. Denies abdominal pain, constipation, diarrhea, change in stool caliber, melena, hematochezia, dysphagia, reflux symptoms, chest pain.   Current Medications, Allergies, Past Medical History, Past Surgical History, Family History and Social History were reviewed in [employer] record.  Physical Exam: General: Well developed , well nourished, no acute distress Head: Normocephalic and atraumatic Eyes:  sclerae anicteric, EOMI Ears: Normal auditory acuity Mouth: No deformity or lesions Lungs: Clear throughout to auscultation Heart: Regular rate and rhythm; no murmurs, rubs or bruits Abdomen: Soft, non tender and non distended. No masses, hepatosplenomegaly or hernias noted. Normal Bowel sounds Musculoskeletal: Symmetrical with no gross deformities  Pulses:  Normal pulses noted Extremities: No clubbing, cyanosis, edema or deformities noted Neurological: Alert oriented x 4, grossly nonfocal Psychological:  Alert and cooperative. Anxious.  Assessment and Recommendations:  1. Nausea and vomiting related to recent kidney infection. Return to Dr. Evlyn Kanner and her urologist to reassess her kidney infection. In addition she has  a history of pyloric stenosis and anxiety both contributing to nausea and vomiting previously and she takes morphine and hydrocodone on a regular basis which may contribute to decreased intestinal motility,nausea and vomiting. She is advised to remain on proton pump inhibitor long term, Dexilant daily for the next 2 weeks and then Zegerid OTC on a daily basis long-term. Frequent low-fat meals and standard antireflux measures are recommended. If her nausea and vomiting persists despite resolution of her kidney infection consider further GI evaluation with a UGI series and possible EGD with pyloric dilation. MAC sedation was recommended for endoscopic procedures.

## 2011-08-31 NOTE — Patient Instructions (Addendum)
Take Dexilant samples one tablet by mouth once daily for 2 more weeks then take take over the counter Zegerid once daily long term.  We have sent a refill to your pharmacy for promethazine. cc: Adrian Prince, MD

## 2011-10-19 ENCOUNTER — Encounter (HOSPITAL_COMMUNITY): Payer: Self-pay | Admitting: *Deleted

## 2011-10-19 ENCOUNTER — Other Ambulatory Visit: Payer: Self-pay

## 2011-10-19 ENCOUNTER — Emergency Department (HOSPITAL_COMMUNITY)
Admission: EM | Admit: 2011-10-19 | Discharge: 2011-10-20 | Disposition: A | Discharge status: Home / Self Care | Payer: Medicare Other | Attending: Emergency Medicine | Admitting: Emergency Medicine

## 2011-10-19 ENCOUNTER — Emergency Department (HOSPITAL_COMMUNITY): Payer: Medicare Other

## 2011-10-19 DIAGNOSIS — S82402A Unspecified fracture of shaft of left fibula, initial encounter for closed fracture: Secondary | ICD-10-CM

## 2011-10-19 DIAGNOSIS — S82409A Unspecified fracture of shaft of unspecified fibula, initial encounter for closed fracture: Secondary | ICD-10-CM | POA: Insufficient documentation

## 2011-10-19 DIAGNOSIS — X500XXA Overexertion from strenuous movement or load, initial encounter: Secondary | ICD-10-CM | POA: Insufficient documentation

## 2011-10-19 DIAGNOSIS — R55 Syncope and collapse: Secondary | ICD-10-CM | POA: Insufficient documentation

## 2011-10-19 DIAGNOSIS — I951 Orthostatic hypotension: Secondary | ICD-10-CM

## 2011-10-19 LAB — DIFFERENTIAL
Basophils Relative: 1 % (ref 0–1)
Eosinophils Absolute: 0.1 10*3/uL (ref 0.0–0.7)
Eosinophils Relative: 1 % (ref 0–5)
Lymphs Abs: 4 10*3/uL (ref 0.7–4.0)

## 2011-10-19 LAB — CBC
MCH: 29.7 pg (ref 26.0–34.0)
MCHC: 33.9 g/dL (ref 30.0–36.0)
MCV: 87.7 fL (ref 78.0–100.0)
Platelets: 257 10*3/uL (ref 150–400)
RBC: 5.05 MIL/uL (ref 3.87–5.11)
RDW: 13.7 % (ref 11.5–15.5)

## 2011-10-19 LAB — POCT I-STAT, CHEM 8
BUN: 20 mg/dL (ref 6–23)
Calcium, Ion: 1.16 mmol/L (ref 1.12–1.32)
Creatinine, Ser: 0.9 mg/dL (ref 0.50–1.10)
TCO2: 25 mmol/L (ref 0–100)

## 2011-10-19 IMAGING — CR DG ANKLE COMPLETE 3+V*L*
3 series · 3 of 3 positions shown · non-contrast
Comparison: None.

CLINICAL DATA: Fall.  Left ankle pain.

LEFT ANKLE COMPLETE - 3+ VIEW

[x ankle ap left]
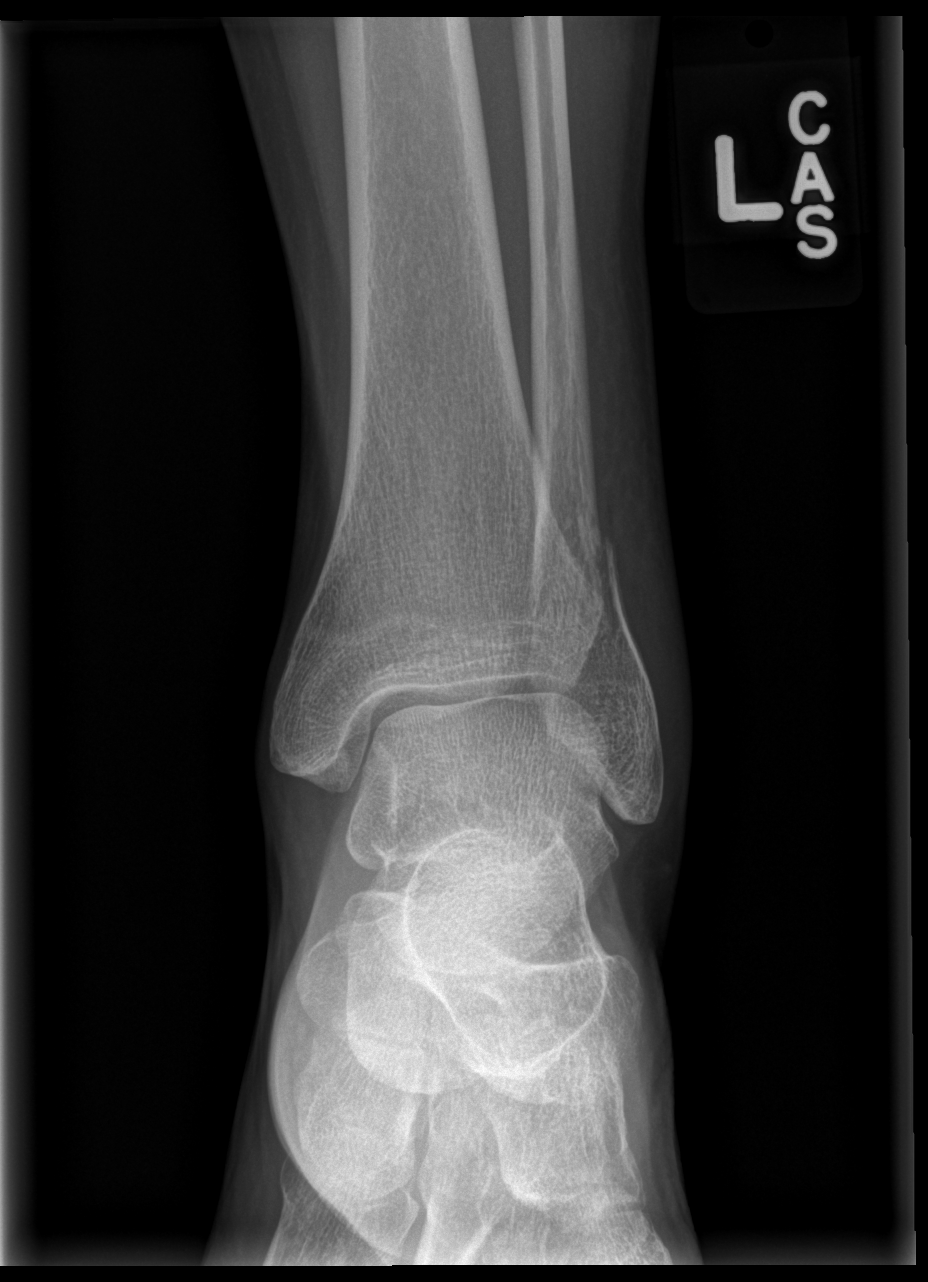

[x ankle obl left]
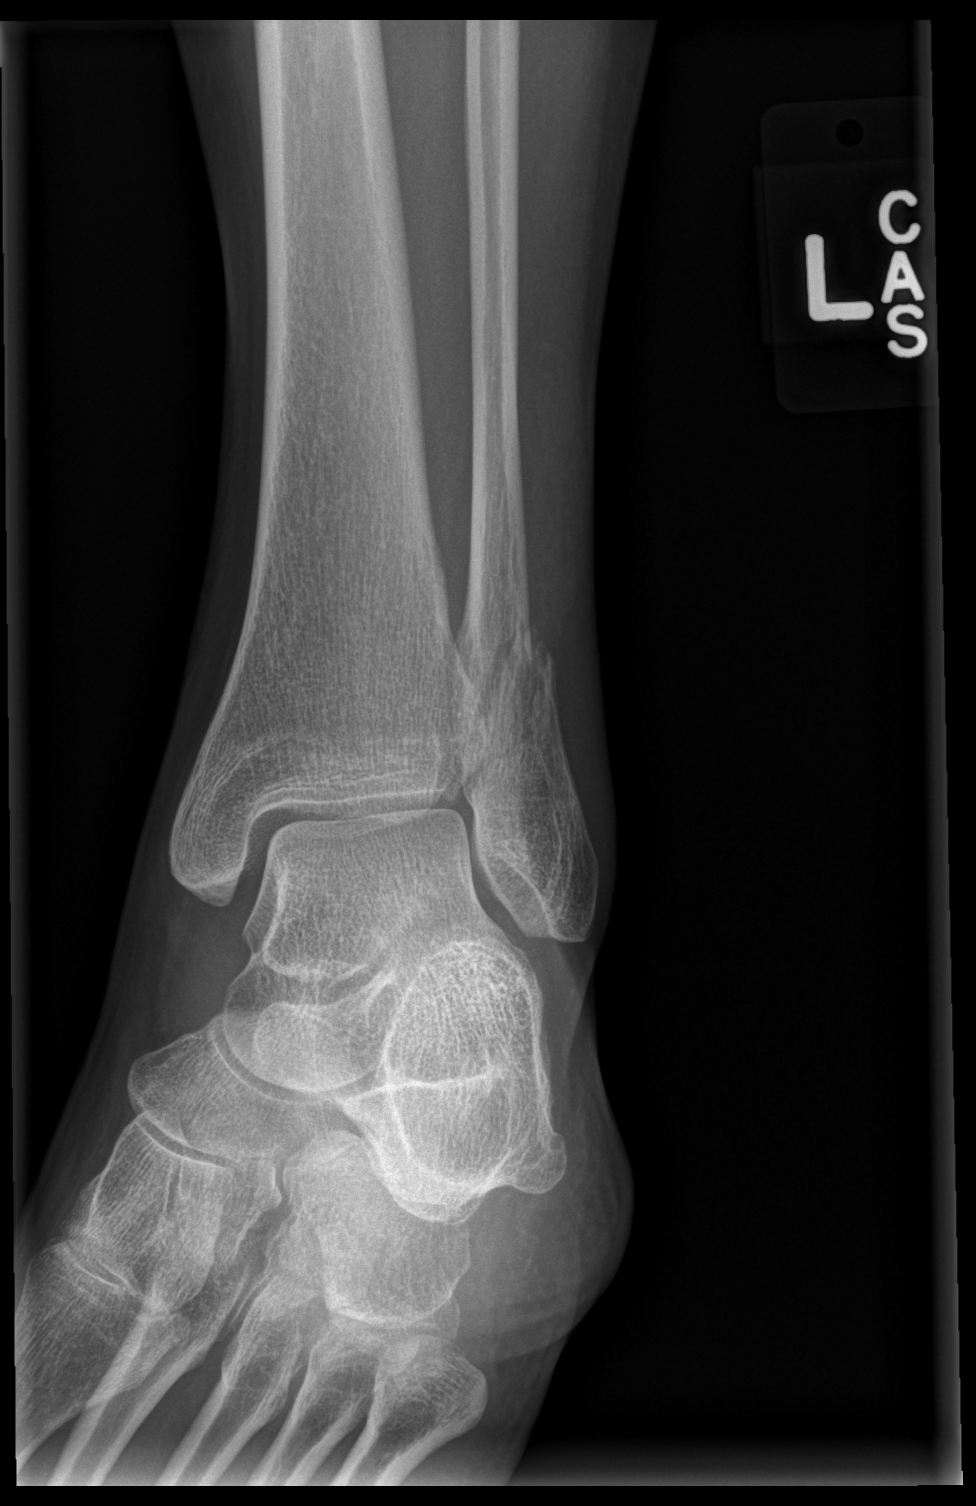

[x ankle lat left]
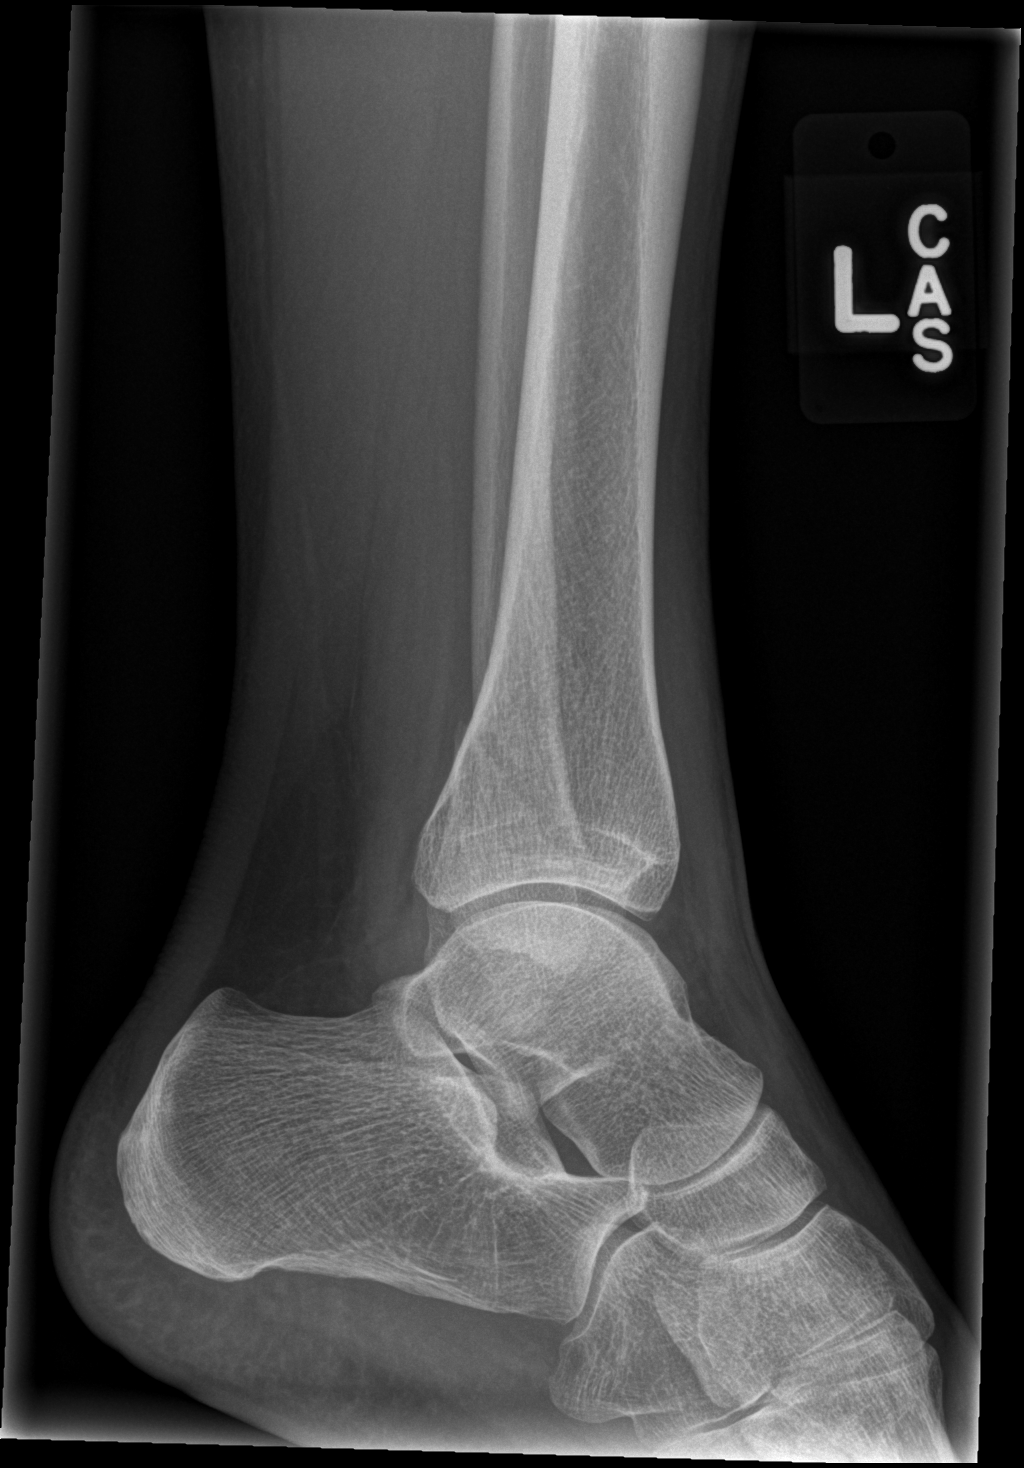

[3 of 3 positions shown; findings below may reference images not displayed]

FINDINGS: Oblique distal fibular metaphysis fracture is present.
Overlying soft tissue swelling.  The fibular fracture is minimally
displaced.  The ankle effusion is present.  The ankle mortise is
congruent.  The talar dome is intact.  Posterior malleolus is
intact.
IMPRESSION: Minimally displaced distal fibular metaphyseal fracture.

## 2011-10-19 MED ORDER — ONDANSETRON HCL 4 MG/2ML IJ SOLN
4.0000 mg | Freq: Once | INTRAMUSCULAR | Status: AC
  Administered 2011-10-19: 4 mg via INTRAVENOUS
  Filled 2011-10-19: qty 2

## 2011-10-19 MED ORDER — SODIUM CHLORIDE 0.9 % IV BOLUS (SEPSIS)
1000.0000 mL | Freq: Once | INTRAVENOUS | Status: AC
  Administered 2011-10-19: 1000 mL via INTRAVENOUS

## 2011-10-19 NOTE — ED Provider Notes (Signed)
History     CSN: [id number]  Arrival date & time 10/19/11  2236   First MD Initiated Contact with Patient 10/19/11 2238      Chief Complaint  Patient presents with  . Ankle Pain    twisted ankle  . Nausea    (Consider location/radiation/quality/duration/timing/severity/associated sxs/prior treatment) HPI  Past Medical History  Diagnosis Date  . Depression   . GERD (gastroesophageal reflux disease)   . History of nephrolithiasis   . History of UTI   . ADD (attention deficit disorder)   . Recreational drug use     History  . Tobacco abuse   . Asthma   . Anxiety disorder   . COPD (chronic obstructive pulmonary disease)   . Fibroid uterus     Past Surgical History  Procedure Date  . Lithotripsy 1985  . Tonsillectomy   . Exploration middle ear     with revision of left stapedectomy and replacement of prosthesis  . Fetal surgery for congenital hernia     Family History  Problem Relation Age of Onset  . Coronary artery disease Mother   . Hypertension Mother   . Diabetes Father   . Colon cancer Maternal Grandfather     History  Substance Use Topics  . Smoking status: Current Everyday Smoker -- 0.5 packs/day    Types: Cigarettes  . Smokeless tobacco: Never Used   Comment: Patient given counseling sheet to quit smoking   . Alcohol Use: No     hx of heavy Etoh use in the past    OB History    Grav Para Term Preterm Abortions TAB SAB Ect Mult Living                  Review of Systems  Allergies  Cefuroxime axetil and Codeine  Home Medications   Current Outpatient Rx  Name Route Sig Dispense Refill  . AMPHETAMINE-DEXTROAMPHETAMINE 15 MG PO TABS Oral Take 15 mg by mouth 2 (two) times daily.     Marland Kitchen CITALOPRAM HYDROBROMIDE 40 MG PO TABS Oral Take 40 mg by mouth 2 (two) times daily.      . DEXLANSOPRAZOLE 60 MG PO CPDR Oral Take 60 mg by mouth daily.      Marland Kitchen DIAZEPAM 10 MG PO TABS Oral Take 10 mg by mouth at bedtime.      Marland Kitchen HYDROCODONE-ACETAMINOPHEN  10-325 MG PO TABS Oral Take 1 tablet by mouth daily. Patient is out of medication she said doctor has been out the office    . MELOXICAM 15 MG PO TABS Oral Take 15 mg by mouth daily.      . MORPHINE SULFATE 30 MG PO TABS Oral Take 30 mg by mouth 2 (two) times daily. Patient says she does not have this medication either, she is out    . PROMETHAZINE HCL 25 MG PO TABS Oral Take 1 tablet (25 mg total) by mouth every 6 (six) hours as needed. 30 tablet 0  . TRAZODONE HCL 50 MG PO TABS Oral Take 75 mg by mouth. Takes as directed    . OXYCODONE-ACETAMINOPHEN 5-325 MG PO TABS Oral Take 1 tablet by mouth every 6 (six) hours as needed for pain. 20 tablet 0    BP 129/97  Pulse 75  Temp(Src) 98.3 F (36.8 C) (Oral)  Resp 17  Ht 4\' 11"  (1.499 m)  Wt 90 lb (40.824 kg)  BMI 18.18 kg/m2  SpO2 97%  Physical Exam  ED Course  Procedures (including critical care  time)  Labs Reviewed  URINALYSIS, ROUTINE W REFLEX MICROSCOPIC - Abnormal; Notable for the following:    Color, Urine AMBER (*) BIOCHEMICALS MAY BE AFFECTED BY COLOR   APPearance CLOUDY (*)    Ketones, ur 40 (*)    Protein, ur 30 (*)    All other components within normal limits  POCT I-STAT, CHEM 8 - Abnormal; Notable for the following:    Potassium 3.4 (*)    Hemoglobin 16.3 (*)    HCT 48.0 (*)    All other components within normal limits  URINE MICROSCOPIC-ADD ON - Abnormal; Notable for the following:    Casts HYALINE CASTS (*)    All other components within normal limits  CBC  DIFFERENTIAL  POCT I-STAT TROPONIN I  I-STAT, CHEM 8  I-STAT TROPONIN I   Dg Ankle Complete Left  10/19/2011  *RADIOLOGY REPORT*  Clinical Data: Fall.  Left ankle pain.  LEFT ANKLE COMPLETE - 3+ VIEW  Comparison: None.  Findings: Oblique distal fibular metaphysis fracture is present. Overlying soft tissue swelling.  The fibular fracture is minimally displaced.  The ankle effusion is present.  The ankle mortise is congruent.  The talar dome is intact.   Posterior malleolus is intact.  IMPRESSION: Minimally displaced distal fibular metaphyseal fracture.  Original Report Authenticated By: Andreas Newport, M.D.     1. Closed left fibular fracture   2. Orthostatic syncope     ED ECG REPORT   Date: 10/20/2011  EKG Time: 3:59 AM  Rate: 75  Rhythm: normal sinus rhythm,  normal EKG, normal sinus rhythm, , there are no previous tracings available for comparison  Axis: normal  Intervals:left anterior fascicular block  ST&T Change: borderline T wave abd prolonged QT  Narrative Interpretation: abnormal      Patient has been hydrated, placed in a Cam Walker is feeling better.  At this time       MDM  Dehydration from 3 days of nausea, vomiting, and diarrhea.  Also, potential left ankle fracture due to fall        Arman Filter, NP 10/20/11 0358  Arman Filter, NP 10/20/11 0358  Arman Filter, NP 10/20/11 0359  Arman Filter, NP 10/20/11 [phone]

## 2011-10-19 NOTE — ED Notes (Signed)
RUE:[id number]<JW> Expected date:10/19/11<BR> Expected time:10:17 PM<BR> Means of arrival:Ambulance<BR> Comments:<BR> EMS 241 GC, 55 yof fall w ankle pain

## 2011-10-19 NOTE — ED Notes (Addendum)
Pt states she blacked out and next she knew she was in the floor with ankle pain.pt states she has not been able to eat for three days.

## 2011-10-20 LAB — URINALYSIS, ROUTINE W REFLEX MICROSCOPIC
Glucose, UA: NEGATIVE mg/dL
Hgb urine dipstick: NEGATIVE
Ketones, ur: 40 mg/dL — AB
Leukocytes, UA: NEGATIVE
Protein, ur: 30 mg/dL — AB
Urobilinogen, UA: 1 mg/dL (ref 0.0–1.0)

## 2011-10-20 LAB — URINE MICROSCOPIC-ADD ON

## 2011-10-20 MED ORDER — OXYCODONE-ACETAMINOPHEN 5-325 MG PO TABS: 1.0000 | ORAL_TABLET | Freq: Four times a day (QID) | ORAL | Status: AC | PRN

## 2011-10-20 MED ORDER — OXYCODONE-ACETAMINOPHEN 5-325 MG PO TABS
1.0000 | ORAL_TABLET | Freq: Once | ORAL | Status: AC
  Administered 2011-10-20: 1 via ORAL
  Filled 2011-10-20: qty 1

## 2011-10-20 MED ORDER — SODIUM CHLORIDE 0.9 % IV BOLUS (SEPSIS)
1000.0000 mL | Freq: Once | INTRAVENOUS | Status: AC
  Administered 2011-10-20: 1000 mL via INTRAVENOUS

## 2011-10-20 MED ORDER — ONDANSETRON 4 MG PO TBDP
ORAL_TABLET | ORAL | Status: AC
  Filled 2011-10-20: qty 1

## 2011-10-20 MED ORDER — ONDANSETRON 4 MG PO TBDP
4.0000 mg | ORAL_TABLET | Freq: Once | ORAL | Status: AC
  Administered 2011-10-20: 4 mg via ORAL

## 2011-10-21 NOTE — ED Provider Notes (Signed)
Evaluation and management procedures were performed by the PA/NP under my supervision/collaboration.    Mahalia Dykes D Laveyah Oriol, MD 10/21/11 1514 

## 2011-12-17 ENCOUNTER — Encounter (HOSPITAL_COMMUNITY): Payer: Self-pay | Admitting: *Deleted

## 2011-12-17 ENCOUNTER — Emergency Department (HOSPITAL_COMMUNITY): Payer: Medicare Other

## 2011-12-17 ENCOUNTER — Emergency Department (HOSPITAL_COMMUNITY)
Admission: EM | Admit: 2011-12-17 | Discharge: 2011-12-17 | Disposition: A | Discharge status: Home / Self Care | Payer: Medicare Other | Attending: Emergency Medicine | Admitting: Emergency Medicine

## 2011-12-17 DIAGNOSIS — F329 Major depressive disorder, single episode, unspecified: Secondary | ICD-10-CM | POA: Insufficient documentation

## 2011-12-17 DIAGNOSIS — J4489 Other specified chronic obstructive pulmonary disease: Secondary | ICD-10-CM | POA: Insufficient documentation

## 2011-12-17 DIAGNOSIS — F3289 Other specified depressive episodes: Secondary | ICD-10-CM | POA: Insufficient documentation

## 2011-12-17 DIAGNOSIS — M79609 Pain in unspecified limb: Secondary | ICD-10-CM | POA: Insufficient documentation

## 2011-12-17 DIAGNOSIS — M79672 Pain in left foot: Secondary | ICD-10-CM

## 2011-12-17 DIAGNOSIS — K219 Gastro-esophageal reflux disease without esophagitis: Secondary | ICD-10-CM | POA: Insufficient documentation

## 2011-12-17 DIAGNOSIS — J449 Chronic obstructive pulmonary disease, unspecified: Secondary | ICD-10-CM | POA: Insufficient documentation

## 2011-12-17 IMAGING — CR DG ANKLE COMPLETE 3+V*L*
3 series · 3 of 3 positions shown · non-contrast
Comparison: [DATE].

CLINICAL DATA: Ankle fracture follow-up.  Skin changes dorsal
aspect of foot.

LEFT ANKLE COMPLETE - 3+ VIEW

[x ankle ap left]
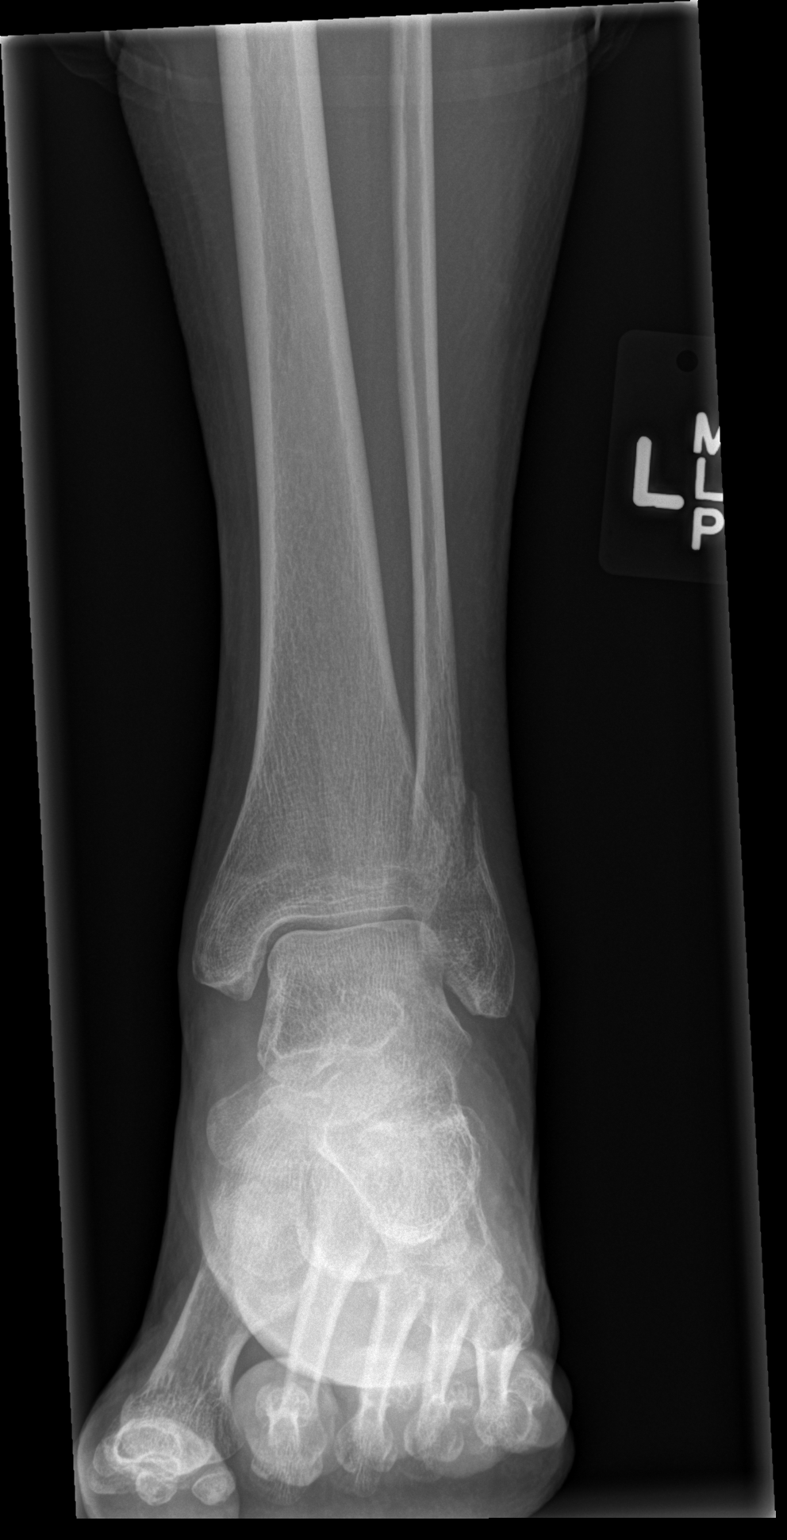

[x ankle obl left]
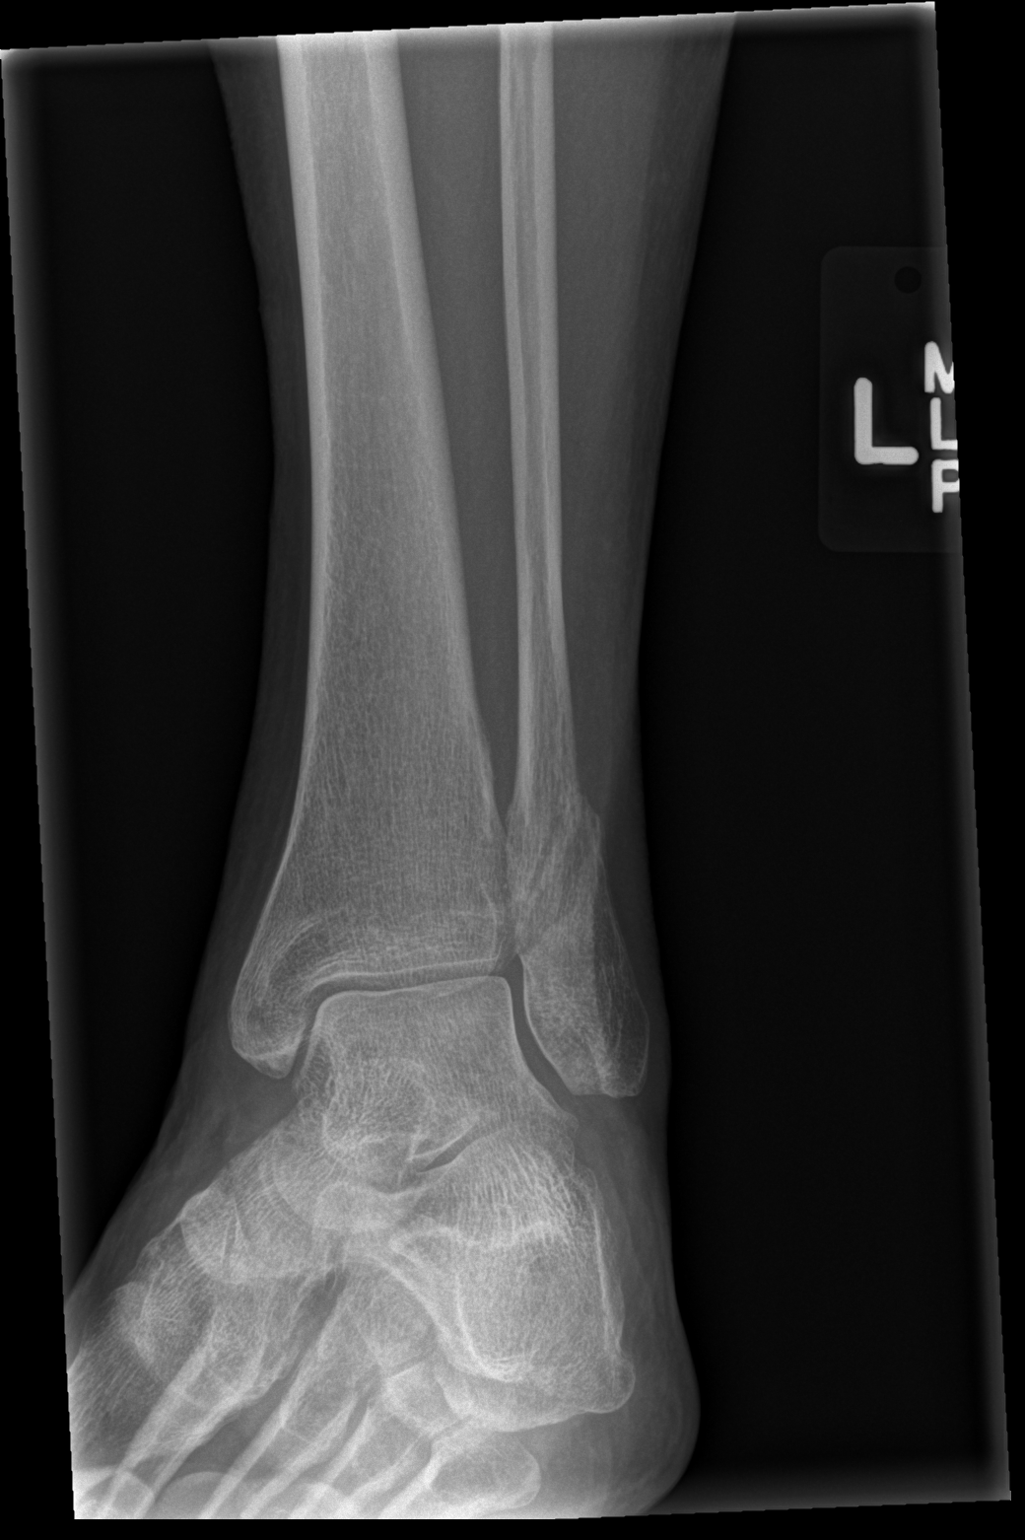

[x ankle lat left]
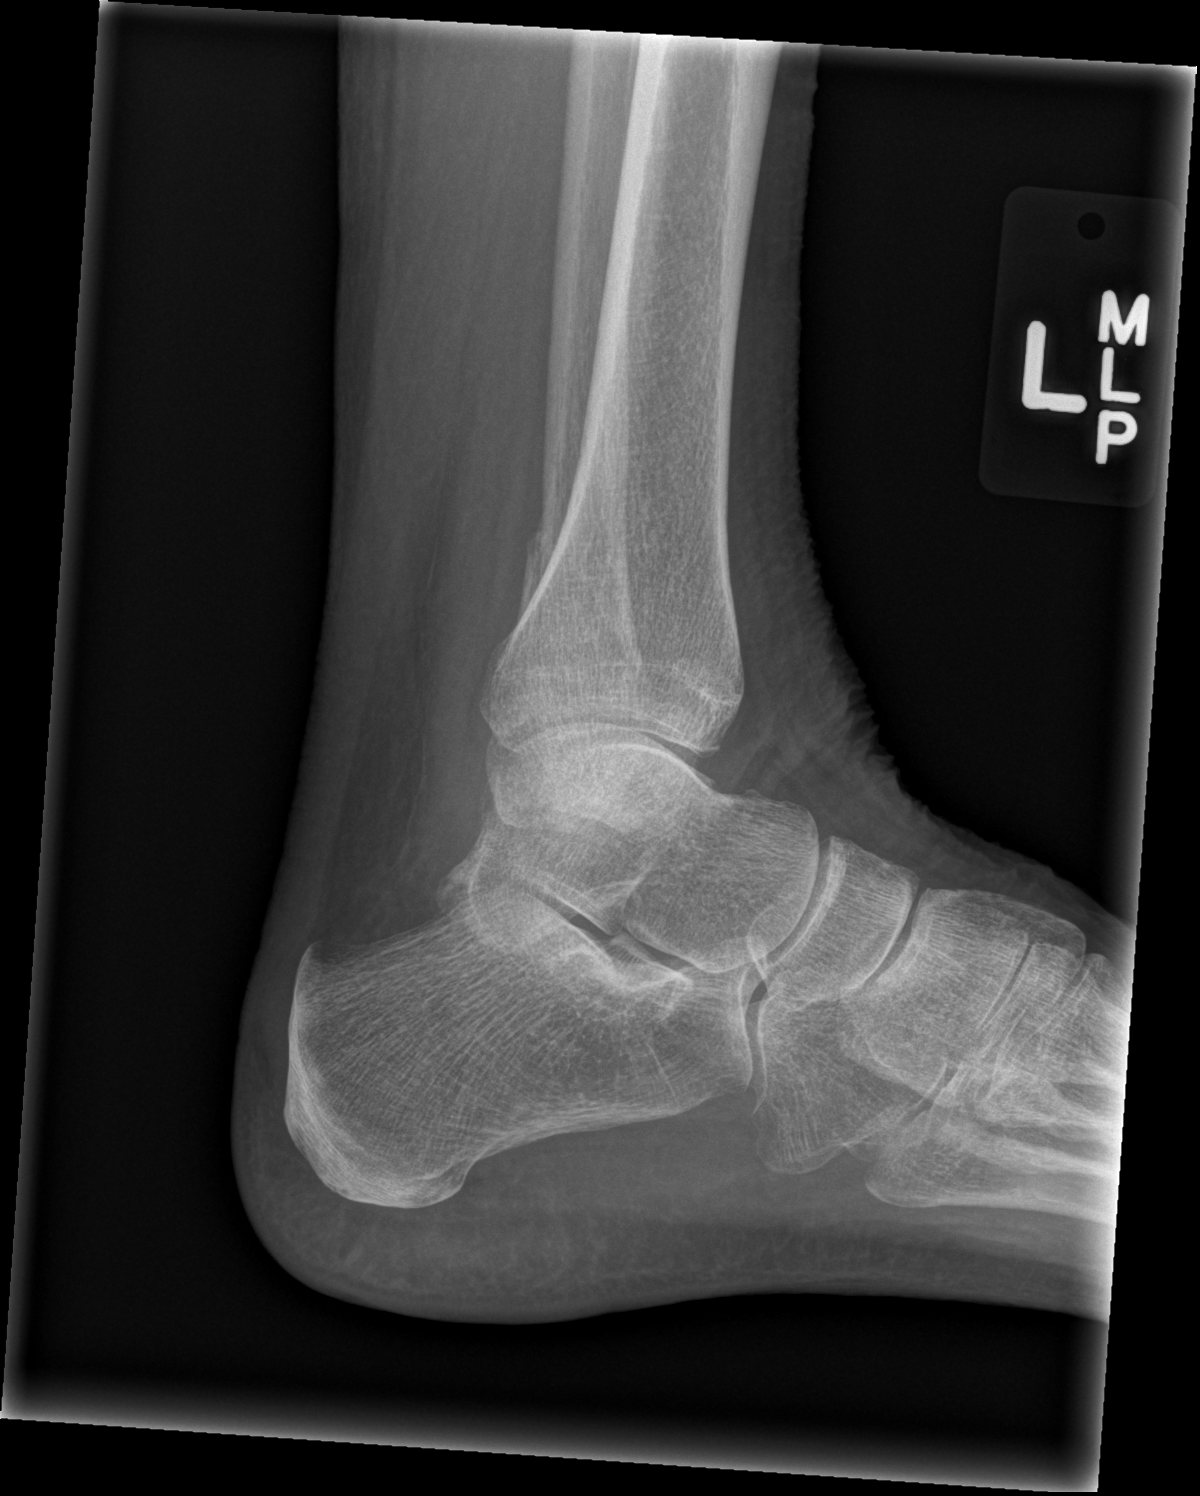

[3 of 3 positions shown; findings below may reference images not displayed]

FINDINGS: Incomplete radiographic healing of the oblique fracture
of the distal left fibula.

Posterior to the calcaneus, soft tissue swelling.  No plain film
evidence of osteomyelitis.
IMPRESSION: Incomplete radiographic healing of the oblique fracture of the
distal left fibula.

Posterior to the calcaneus, soft tissue swelling.  No plain film
evidence of osteomyelitis.

## 2011-12-17 NOTE — ED Notes (Signed)
Pt states "I see Charlann Boxer for my foot but [facility] for my back, I told Dr. Charlann Boxer to talk to [facility] about my meds, when you sign a pain agreement does that mean I can't have meds from anywhere else"; informed pt needs to discuss with MD, pt presents with crutches & gamma walker in hand, c/o left foot pain

## 2011-12-17 NOTE — ED Provider Notes (Signed)
History    Melanie Caldwell with L foot pain. Pt with distal fib fracture at the end of December. Has been in walking boot since. Presenting today because of pain in dorsum of foot. Has been wrapping foot herself. Denies new trauma. Per pt, has been on abx for infection of her foot. No fever or chills. No n/v. No numbness or tingling.   CSN: [id number]  Arrival date & time 12/17/11  6962   First MD Initiated Contact with Patient 12/17/11 [phone]      Chief Complaint  Patient presents with  . Foot Pain    (Consider location/radiation/quality/duration/timing/severity/associated sxs/prior treatment) HPI  Past Medical History  Diagnosis Date  . Depression   . GERD (gastroesophageal reflux disease)   . History of nephrolithiasis   . History of UTI   . ADD (attention deficit disorder)   . Recreational drug use     History  . Tobacco abuse   . Asthma   . Anxiety disorder   . COPD (chronic obstructive pulmonary disease)   . Fibroid uterus     Past Surgical History  Procedure Date  . Lithotripsy 1985  . Tonsillectomy   . Exploration middle ear     with revision of left stapedectomy and replacement of prosthesis  . Fetal surgery for congenital hernia     Family History  Problem Relation Age of Onset  . Coronary artery disease Mother   . Hypertension Mother   . Diabetes Father   . Colon cancer Maternal Grandfather     History  Substance Use Topics  . Smoking status: Current Everyday Smoker -- 0.5 packs/day    Types: Cigarettes  . Smokeless tobacco: Never Used   Comment: Patient given counseling sheet to quit smoking   . Alcohol Use: No     hx of heavy Etoh use in the past    OB History    Grav Para Term Preterm Abortions TAB SAB Ect Mult Living                  Review of Systems   Review of symptoms negative unless otherwise noted in HPI.   Allergies  Cefuroxime axetil and Codeine  Home Medications   Current Outpatient Rx  Name Route Sig Dispense Refill  .  AMPHETAMINE-DEXTROAMPHETAMINE 30 MG PO TABS Oral Take 30 mg by mouth daily.    Marland Kitchen CITALOPRAM HYDROBROMIDE 40 MG PO TABS Oral Take 40 mg by mouth daily.     . DEXLANSOPRAZOLE 60 MG PO CPDR Oral Take 60 mg by mouth daily.      Marland Kitchen DIAZEPAM 10 MG PO TABS Oral Take 10 mg by mouth daily as needed. anxiety    . DOXYCYCLINE HYCLATE 100 MG PO CAPS Oral Take 100 mg by mouth 2 (two) times daily. Pt started on 12-02-11 for 10 days. Pt has 1 full day left of therapy remaining.    Marland Kitchen MORPHINE SULFATE 30 MG PO TABS Oral Take 30 mg by mouth 2 (two) times daily. Patient says she does not have this medication either, she is out      BP 117/63  Pulse 68  Temp(Src) 98.2 F (36.8 C) (Oral)  Resp 17  Wt 90 lb (40.824 kg)  SpO2 100%  Physical Exam  Nursing note and vitals reviewed. Constitutional: She appears well-developed and well-nourished. No distress.  HENT:  Head: Normocephalic and atraumatic.  Eyes: Conjunctivae are normal. Right eye exhibits no discharge. Left eye exhibits no discharge.  Neck: Neck supple.  Cardiovascular:  Normal rate, regular rhythm and normal heart sounds.  Exam reveals no gallop and no friction rub.   No murmur heard. Pulmonary/Chest: Effort normal and breath sounds normal. No respiratory distress.  Abdominal: Soft.  Musculoskeletal:       Quarter sized area of ecchymosis dorsum of L foot. Skin intact. No surrounding erythema or increased warmth. Mild diffuse swelling of ankle. Neurovascularly intact distally.   Neurological: She is alert.  Skin: Skin is warm and dry. She is not diaphoretic.  Psychiatric: She has a normal mood and affect. Her behavior is normal. Thought content normal.    ED Course  Procedures (including critical care time)  Labs Reviewed - No data to display Dg Ankle Complete Left  12/17/2011  *RADIOLOGY REPORT*  Clinical Data: Ankle fracture follow-up.  Skin changes dorsal aspect of foot.  LEFT ANKLE COMPLETE - 3+ VIEW  Comparison: 10/19/2011.  Findings:  Incomplete radiographic healing of the oblique fracture of the distal left fibula.  Posterior to the calcaneus, soft tissue swelling.  No plain film evidence of osteomyelitis.  IMPRESSION: Incomplete radiographic healing of the oblique fracture of the distal left fibula.  Posterior to the calcaneus, soft tissue swelling.  No plain film evidence of osteomyelitis.  Original Report Authenticated By: Fuller Canada, M.D.     1. Left foot pain       MDM  Melanie Caldwell with L foot pain. Pt with a closed distal fib fx L ankle 09/2411. Has been wearing walker all the time since.  Has been on abx for I assume cellulitis of foot.  Small area of ecchymosis dorsum of L foot likely from walking boot but no clinical evidence of infection on my exam. Pt has been wearing boot constantly. Discussed that can remove boot while at rest and that needs to follow back up with orthopedist for further recommendation.        Raeford Razor, MD 12/17/11 [phone]

## 2011-12-17 NOTE — ED Notes (Signed)
Pt states she has pain to her left foot that she thinks was caused from the boot she has to wear for the fx of her foot that happened at Christmas.  She is going to the pain clinic and has a pain contract with them and the MD is going to see what it intails she is alert and oriented skin w&d

## 2012-09-21 ENCOUNTER — Emergency Department (HOSPITAL_COMMUNITY): Payer: Medicare Other

## 2012-09-21 ENCOUNTER — Emergency Department (HOSPITAL_COMMUNITY)
Admission: EM | Admit: 2012-09-21 | Discharge: 2012-09-21 | Disposition: A | Discharge status: Home / Self Care | Payer: Medicare Other | Attending: Emergency Medicine | Admitting: Emergency Medicine

## 2012-09-21 ENCOUNTER — Encounter (HOSPITAL_COMMUNITY): Payer: Self-pay | Admitting: *Deleted

## 2012-09-21 DIAGNOSIS — R112 Nausea with vomiting, unspecified: Secondary | ICD-10-CM | POA: Insufficient documentation

## 2012-09-21 DIAGNOSIS — J449 Chronic obstructive pulmonary disease, unspecified: Secondary | ICD-10-CM | POA: Insufficient documentation

## 2012-09-21 DIAGNOSIS — Z87440 Personal history of urinary (tract) infections: Secondary | ICD-10-CM | POA: Insufficient documentation

## 2012-09-21 DIAGNOSIS — F3289 Other specified depressive episodes: Secondary | ICD-10-CM | POA: Insufficient documentation

## 2012-09-21 DIAGNOSIS — K5289 Other specified noninfective gastroenteritis and colitis: Secondary | ICD-10-CM | POA: Insufficient documentation

## 2012-09-21 DIAGNOSIS — F411 Generalized anxiety disorder: Secondary | ICD-10-CM | POA: Insufficient documentation

## 2012-09-21 DIAGNOSIS — F17200 Nicotine dependence, unspecified, uncomplicated: Secondary | ICD-10-CM | POA: Insufficient documentation

## 2012-09-21 DIAGNOSIS — K529 Noninfective gastroenteritis and colitis, unspecified: Secondary | ICD-10-CM

## 2012-09-21 DIAGNOSIS — F988 Other specified behavioral and emotional disorders with onset usually occurring in childhood and adolescence: Secondary | ICD-10-CM | POA: Insufficient documentation

## 2012-09-21 DIAGNOSIS — Z79899 Other long term (current) drug therapy: Secondary | ICD-10-CM | POA: Insufficient documentation

## 2012-09-21 DIAGNOSIS — Z87442 Personal history of urinary calculi: Secondary | ICD-10-CM | POA: Insufficient documentation

## 2012-09-21 DIAGNOSIS — F329 Major depressive disorder, single episode, unspecified: Secondary | ICD-10-CM | POA: Insufficient documentation

## 2012-09-21 DIAGNOSIS — Z8544 Personal history of malignant neoplasm of other female genital organs: Secondary | ICD-10-CM | POA: Insufficient documentation

## 2012-09-21 DIAGNOSIS — R197 Diarrhea, unspecified: Secondary | ICD-10-CM | POA: Insufficient documentation

## 2012-09-21 DIAGNOSIS — K219 Gastro-esophageal reflux disease without esophagitis: Secondary | ICD-10-CM | POA: Insufficient documentation

## 2012-09-21 DIAGNOSIS — Z8742 Personal history of other diseases of the female genital tract: Secondary | ICD-10-CM | POA: Insufficient documentation

## 2012-09-21 DIAGNOSIS — F1911 Other psychoactive substance abuse, in remission: Secondary | ICD-10-CM | POA: Insufficient documentation

## 2012-09-21 DIAGNOSIS — Z3202 Encounter for pregnancy test, result negative: Secondary | ICD-10-CM | POA: Insufficient documentation

## 2012-09-21 DIAGNOSIS — J4489 Other specified chronic obstructive pulmonary disease: Secondary | ICD-10-CM | POA: Insufficient documentation

## 2012-09-21 LAB — CBC WITH DIFFERENTIAL/PLATELET
Lymphocytes Relative: 23 % (ref 12–46)
Lymphs Abs: 3.2 10*3/uL (ref 0.7–4.0)
Neutro Abs: 9.7 10*3/uL — ABNORMAL HIGH (ref 1.7–7.7)
Neutrophils Relative %: 70 % (ref 43–77)
Platelets: 338 10*3/uL (ref 150–400)
RBC: 5.73 MIL/uL — ABNORMAL HIGH (ref 3.87–5.11)
WBC: 13.9 10*3/uL — ABNORMAL HIGH (ref 4.0–10.5)

## 2012-09-21 LAB — URINALYSIS, ROUTINE W REFLEX MICROSCOPIC
Bilirubin Urine: NEGATIVE
Glucose, UA: NEGATIVE mg/dL
Specific Gravity, Urine: 1.022 (ref 1.005–1.030)
Urobilinogen, UA: 0.2 mg/dL (ref 0.0–1.0)

## 2012-09-21 LAB — COMPREHENSIVE METABOLIC PANEL
ALT: 14 U/L (ref 0–35)
Alkaline Phosphatase: 101 U/L (ref 39–117)
CO2: 27 mEq/L (ref 19–32)
Chloride: 90 mEq/L — ABNORMAL LOW (ref 96–112)
GFR calc Af Amer: >90 mL/min (ref >90)
GFR calc non Af Amer: >90 mL/min (ref >90)
Glucose, Bld: 129 mg/dL — ABNORMAL HIGH (ref 70–99)
Potassium: 3.4 mEq/L — ABNORMAL LOW (ref 3.5–5.1)
Sodium: 134 mEq/L — ABNORMAL LOW (ref 135–145)
Total Protein: 8.9 g/dL — ABNORMAL HIGH (ref 6.0–8.3)

## 2012-09-21 LAB — URINE MICROSCOPIC-ADD ON

## 2012-09-21 IMAGING — CT CT ABD-PELV W/ CM
1 of 3 series · 14 of 32 positions shown, 19 images · IV contrast (OMNIPAQUE 300)
Comparison: [DATE]

CLINICAL DATA: Nausea, vomiting, and epigastric pain for 5 days

CT ABDOMEN AND PELVIS WITH CONTRAST
TECHNIQUE: Multidetector CT imaging of the abdomen and pelvis was
performed following the standard protocol during bolus
administration of intravenous contrast. Oral contrast was also
administered.
Contrast: 100mL OMNIPAQUE IOHEXOL 300 MG/ML  SOLN

[Series 2: abd/pel with · axial · 0.66mm/px · z∈[-394,-49]mm · 14 of 79 slices shown, 19 images]
[im 5/79  soft-tissue]
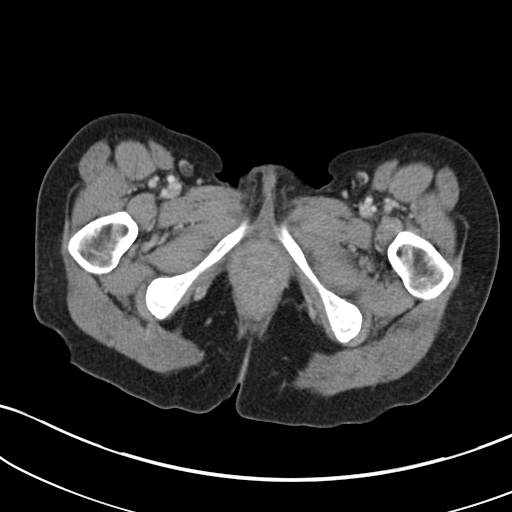
[im 5/79  bone]
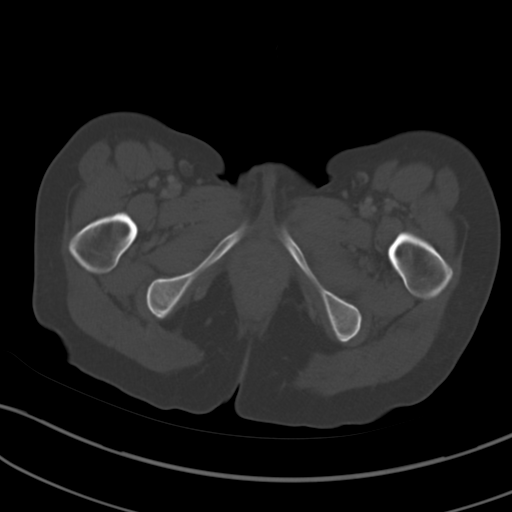
[im 9/79  soft-tissue]
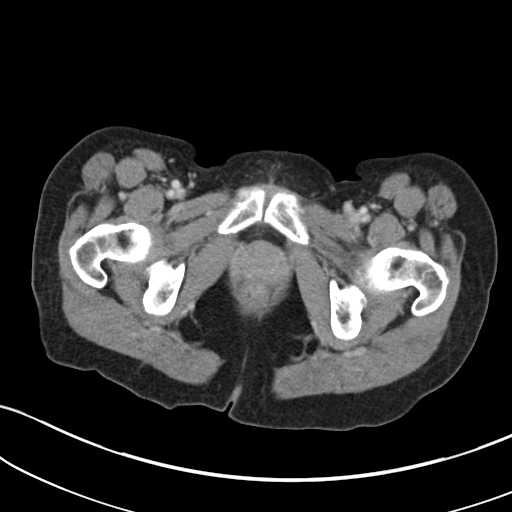
[im 18/79  soft-tissue]
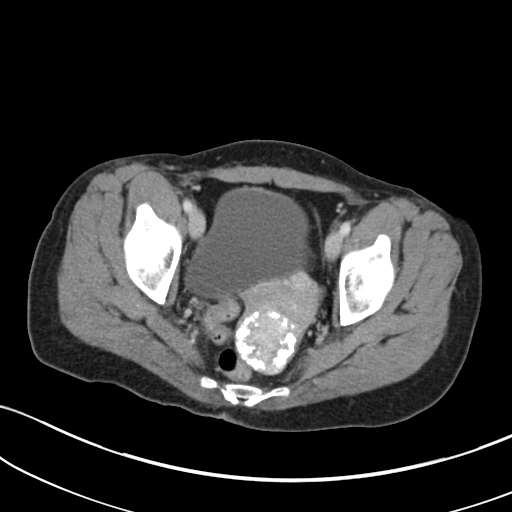
[im 22/79  soft-tissue]
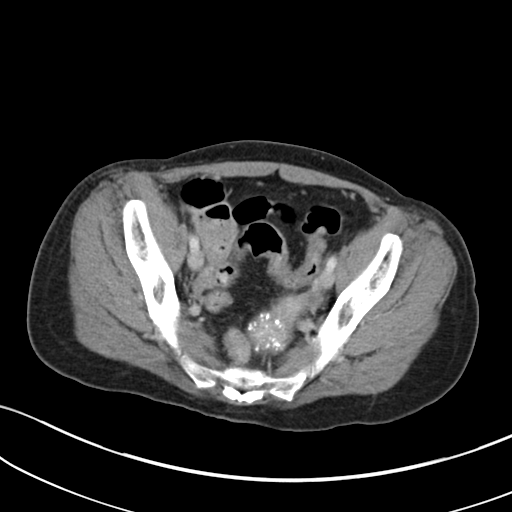
[im 27/79  soft-tissue]
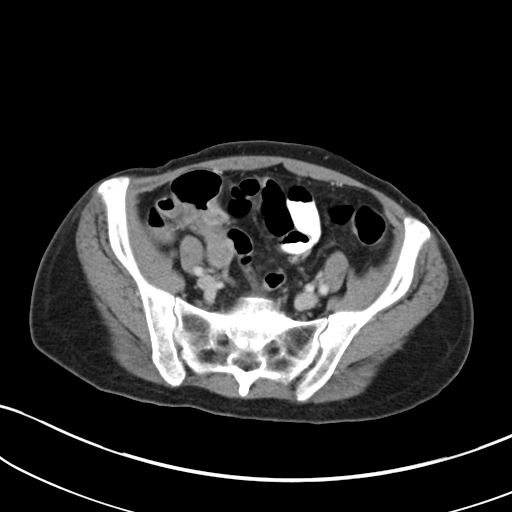
[im 35/79  soft-tissue]
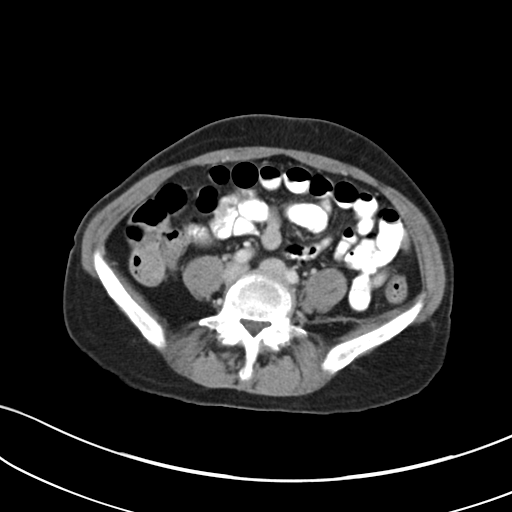
[im 40/79  soft-tissue]
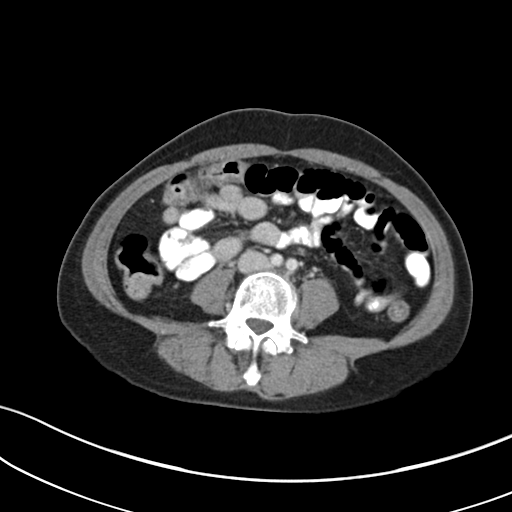
[im 44/79  soft-tissue]
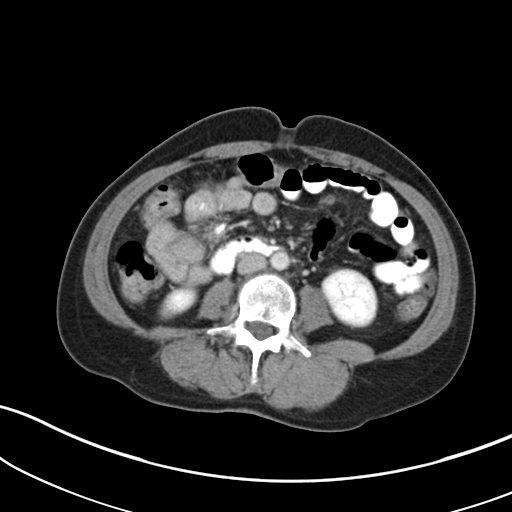
[im 53/79  soft-tissue]
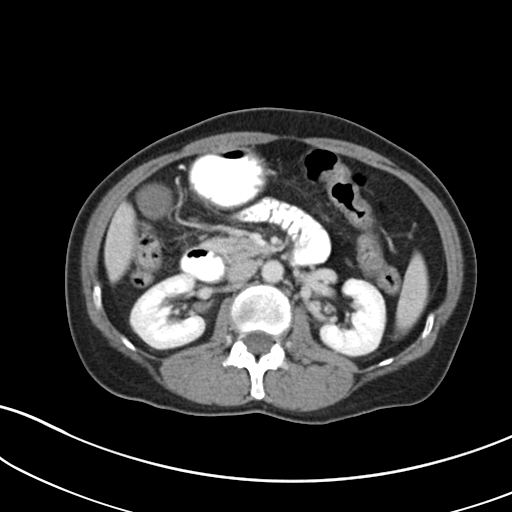
[im 53/79  bone]
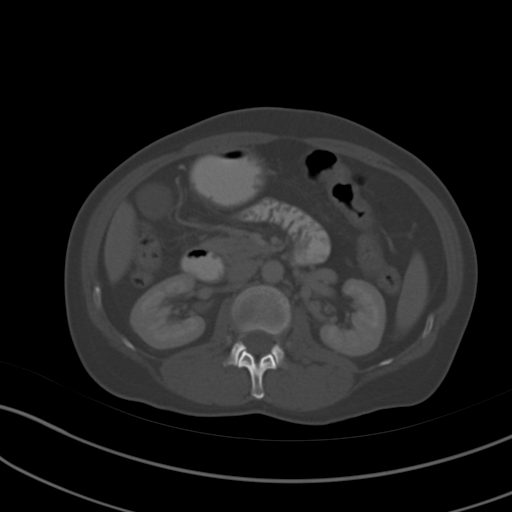
[im 57/79  soft-tissue]
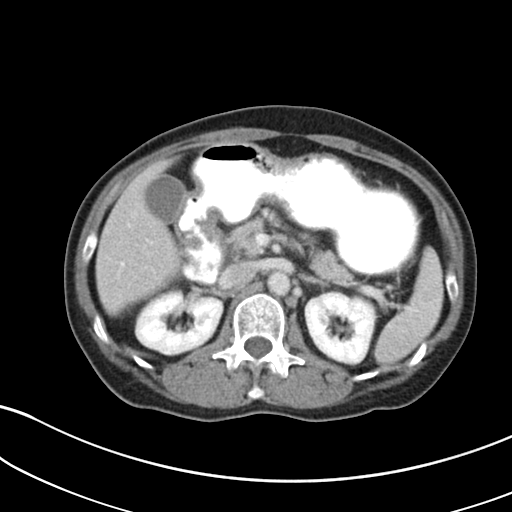
[im 61/79  soft-tissue]
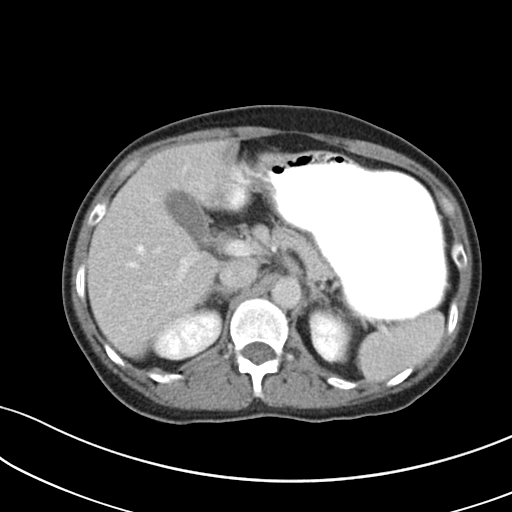
[im 61/79  lung]
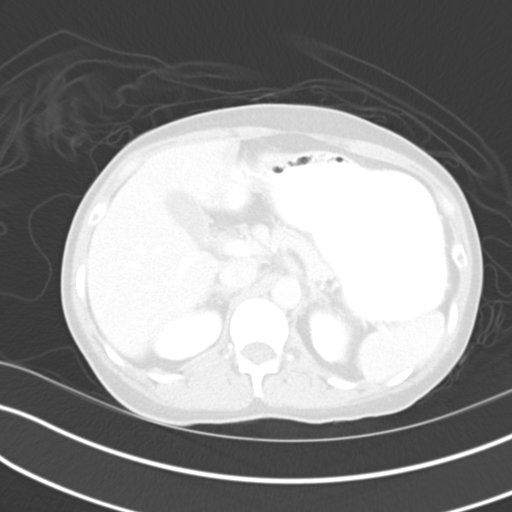
[im 66/79  lung]
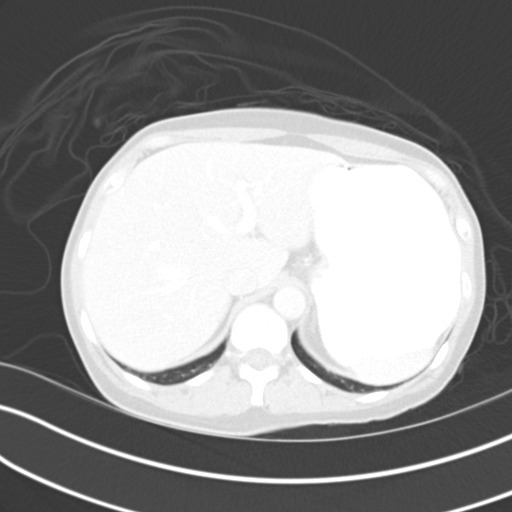
[im 70/79  soft-tissue]
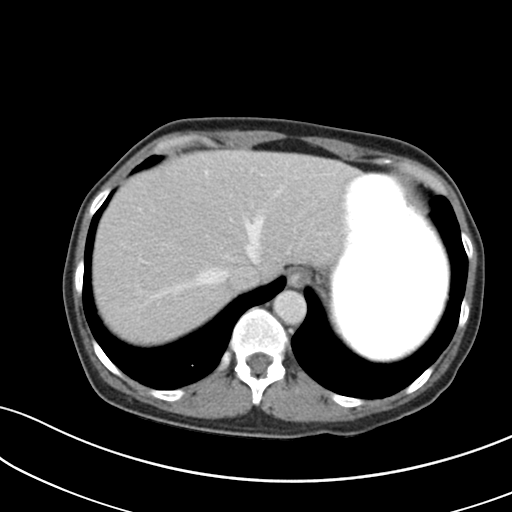
[im 70/79  lung]
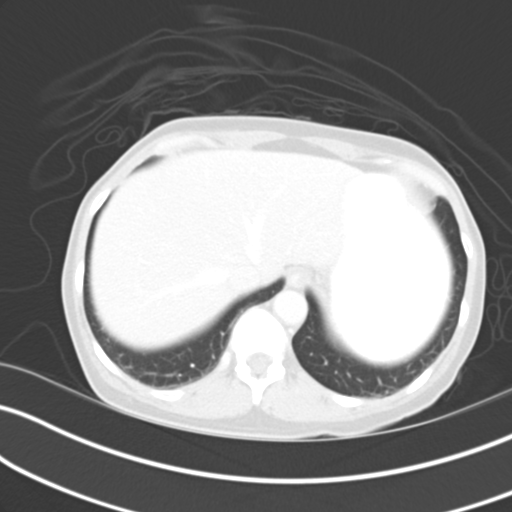
[im 74/79  soft-tissue]
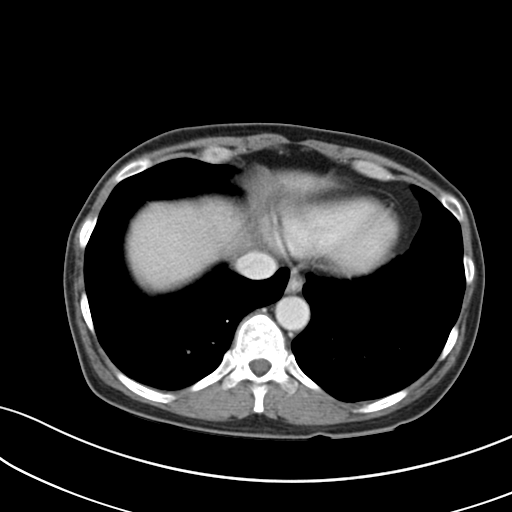
[im 74/79  lung]
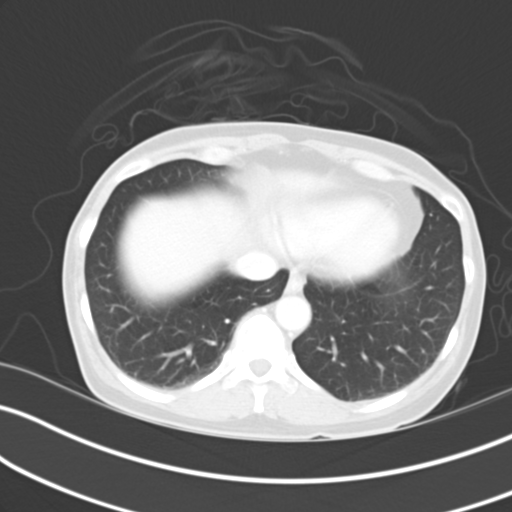

[14 of 32 positions shown; findings below may reference images not displayed]

FINDINGS: Lung bases are clear.  There is fatty infiltration near
the fissure for the ligamentum teres.  No focal liver lesions are
appreciated.  There is no biliary duct dilatation.

There is thickening in the region of the pylorus and proximal
duodenum.  The surrounding mesentery in these areas appears normal.

Spleen, pancreas, and adrenals appear normal.  Flank kidneys show
no mass or hydronephrosis on either side.

In the pelvis, there are multiple calcified uterine leiomyomas, a
stable finding.  Extrinsic to the uterus, there is no pelvic mass
or fluid collection.  This appears normal.

There is no bowel obstruction.  There is no free air or portal
venous air.

There is no ascites, adenopathy, or abscess in the abdomen or
pelvis.  Aorta is nonaneurysmal.  There is mild atherosclerotic
change in the aorta.

There is some arthropathy in the lumbar spine.  There are no
blastic or lytic bone lesions.
IMPRESSION: Thickening of the wall of the pylorus and proximal
duodenum, consistent with inflammatory change in these areas.  The
surrounding mesentery appears normal, however, and there is no
fistula.

There is no bowel obstruction.  No abscess.  Appendix appears
normal.

Multiple calcified uterine leiomyomas, a stable finding.

Mild fatty infiltration in the liver.

## 2012-09-21 MED ORDER — ONDANSETRON 8 MG PO TBDP
8.0000 mg | ORAL_TABLET | Freq: Once | ORAL | Status: AC
  Administered 2012-09-21: 8 mg via ORAL
  Filled 2012-09-21: qty 1

## 2012-09-21 MED ORDER — IOHEXOL 300 MG/ML  SOLN
100.0000 mL | Freq: Once | INTRAMUSCULAR | Status: AC | PRN
  Administered 2012-09-21: 100 mL via INTRAVENOUS

## 2012-09-21 MED ORDER — ONDANSETRON HCL 4 MG/2ML IJ SOLN
4.0000 mg | Freq: Once | INTRAMUSCULAR | Status: AC
  Administered 2012-09-21: 4 mg via INTRAVENOUS
  Filled 2012-09-21: qty 2

## 2012-09-21 MED ORDER — MORPHINE SULFATE 4 MG/ML IJ SOLN
4.0000 mg | Freq: Once | INTRAMUSCULAR | Status: AC
  Administered 2012-09-21: 4 mg via INTRAVENOUS
  Filled 2012-09-21: qty 1

## 2012-09-21 MED ORDER — HYDROCODONE-ACETAMINOPHEN 5-325 MG PO TABS
1.0000 | ORAL_TABLET | Freq: Once | ORAL | Status: DC
  Filled 2012-09-21: qty 1

## 2012-09-21 MED ORDER — SODIUM CHLORIDE 0.9 % IV BOLUS (SEPSIS)
1000.0000 mL | Freq: Once | INTRAVENOUS | Status: AC
  Administered 2012-09-21: 1000 mL via INTRAVENOUS

## 2012-09-21 MED ORDER — HYDROCODONE-ACETAMINOPHEN 5-325 MG PO TABS
1.0000 | ORAL_TABLET | Freq: Once | ORAL | Status: AC
  Administered 2012-09-21: 1 via ORAL
  Filled 2012-09-21: qty 1

## 2012-09-21 MED ORDER — PROMETHAZINE HCL 25 MG PO TABS: 25.0000 mg | ORAL_TABLET | Freq: Four times a day (QID) | ORAL | Status: DC | PRN
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
## 2012-09-21 NOTE — ED Notes (Signed)
Pt throughout day has toileted multiple times and unable to provide a stool sample. PA aware

## 2012-09-21 NOTE — ED Notes (Signed)
PA at bedside Pt alert and oriented x4. Respirations even and unlabored, bilateral symmetrical rise and fall of chest. Skin warm and dry. In no acute distress. Denies needs.   

## 2012-09-21 NOTE — ED Notes (Signed)
ZOX:[id number]<AV> Expected date:<BR> Expected time:<BR> Means of arrival:Ambulance<BR> Comments:<BR> n/v

## 2012-09-21 NOTE — ED Notes (Signed)
Pt escorted to discharge window. Pt verbalized understanding discharge instructions. In no acute distress.  
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
## 2012-09-22 LAB — URINE CULTURE: Culture: NO GROWTH

## 2012-09-27 ENCOUNTER — Ambulatory Visit (INDEPENDENT_AMBULATORY_CARE_PROVIDER_SITE_OTHER): Payer: Medicare Other | Admitting: Nurse Practitioner

## 2012-09-27 ENCOUNTER — Telehealth: Payer: Self-pay | Admitting: Gastroenterology

## 2012-09-27 ENCOUNTER — Encounter: Payer: Self-pay | Admitting: Nurse Practitioner

## 2012-09-27 ENCOUNTER — Other Ambulatory Visit: Payer: Medicare Other

## 2012-09-27 VITALS — BP 110/70 | HR 96 | Ht 59.0 in | Wt 101.0 lb

## 2012-09-27 DIAGNOSIS — R112 Nausea with vomiting, unspecified: Secondary | ICD-10-CM

## 2012-09-27 DIAGNOSIS — K311 Adult hypertrophic pyloric stenosis: Secondary | ICD-10-CM

## 2012-09-27 DIAGNOSIS — R197 Diarrhea, unspecified: Secondary | ICD-10-CM

## 2012-09-27 MED ORDER — PROMETHAZINE HCL 25 MG PO TABS: 25.0000 mg | ORAL_TABLET | Freq: Four times a day (QID) | ORAL | Status: DC | PRN

## 2012-09-27 NOTE — Patient Instructions (Addendum)
Your physician has requested that you go to the basement for the following lab work before leaving today: Stool studies  You have been given samples of Prilosec today, take one pill twice a day.  If this helps you may purchase it over the counter.  You have been scheduled for an endoscopy with propofol. Please follow written instructions given to you at your visit today. If you use inhalers (even only as needed) or a CPAP machine, please bring them with you on the day of your procedure.  You have been set up for radiology studies to be done at [facility] department on 09/29/12 at 10:30am, arrive 10:15am.  No prep needed.    Try to use a full liquid diet for now.  You may continue your phenergan for nausea.  Full Liquid Diet The full liquid diet includes fluids and foods that are liquid, or will become liquid, at room temperature. Ice cream, gelatin dessert, tea, juice, frozen ice pops, and pudding are some examples of foods that can be eaten on a full liquid diet. You cannot eat solid foods on this diet. A person should only be on this diet for a short amount of time. If this diet is to be used for more than 7 days, you may need to take a multivitamin or a nutritional supplement. REASONS FOR USE  It may be used as a transition diet between the clear liquid diet and soft diet.  It may be used when a person cannot tolerate solid foods.  It may be used before or after certain procedures, tests, or surgeries.  It may be used when a person has trouble swallowing or chewing. CHOOSING FOODS Breads and Starches  Allowed: None are allowed except crackers that are pureed (made into a thick, smooth soup) in soup. Potatoes, pasta, and rice are only allowed if they are pureed in soup. Cooked, refined corn, oat, rice, rye, and wheat cereals are also allowed.  Avoid: Any others. Vegetables  Allowed: Strained tomato or vegetable juice. Vegetables pureed in soup.  Avoid: Any  others. Fruit  Allowed: Any strained fruit juices and fruit drinks. Include 1 serving of citrus or vitamin C-enriched fruit juice daily.  Avoid: Any others. Protein  Allowed: Eggs in custard, eggnog mix, and eggs used in ice cream or pudding.  Avoid: Any meat, fish, or fowl. All other cooked or raw eggs. Dairy  Allowed: Milk and milk-based beverages, including milk shakes and instant breakfast mixes. Smooth yogurt. Pureed cottage cheese.  Avoid: Any others. All other cheese. Avoid dairy products if not tolerated. Soups and Combination Foods  Allowed: Broth and strained cream soups. Strained, broth-based soups.  Avoid: Any others. Desserts and Sweets  Allowed: Custard, flavored gelatin, tapioca, plain ice cream, sherbet, smooth pudding, junket, fruit ices, frozen ice pops, and pudding pops. Other frozen bars with cream, frozen fudge pops, and chocolate syrup. Sugar, honey, jelly, and syrup.  Avoid: Any others. Fats and Oils  Allowed: Margarine, butter, cream, sour cream, and oils.  Avoid: Any others. Beverages  Allowed: All.  Avoid: None. Condiments  Allowed: Iodized salt, pepper, spices, and flavorings. Cocoa powder.  Avoid: Any others. SAMPLE MEAL PLAN The following sample meal plan cannot meet the recommended dietary allowances of the [employer] without appropriate supplementation under the guidance of your caregiver or dietitian. Breakfast   cup orange juice.  1 cup cooked wheat cereal.  1 cup milk.  1 cup beverage (coffee or tea).  Cream or sugar, if  desired. Midmorning Snack  1 cup pasteurized eggnog (made from powdered eggs mixed with milk, not raw eggs). Lunch  1 cup cream soup.   cup fruit juice.  1 cup milk.   cup custard.  1 cup beverage (coffee or tea).  Cream or sugar, if desired. Midafternoon Snack  1 cup milk shake. Dinner  1 cup cream soup.   cup fruit juice.  1 cup milk.   cup pudding.  1 cup  beverage (coffee or tea).  Cream or sugar, if desired. Evening Snack  1 cup supplement. To increase calories, add sugar, cream, butter, or margarine if possible. Nutritional supplements will also increase the total calories. Document Released: 10/12/2005 Document Revised: 04/12/2012 Document Reviewed: 01/12/2012 [facility] Patient Information 2013 [place], [place].

## 2012-09-27 NOTE — Progress Notes (Signed)
09/27/2012 Melanie Caldwell [id number] 11/14/1955   History of Present Illness:   Patient is a 56 year old female known to Dr. Russella Dar. She has a history of pyloric stenosis. An upper endoscopy November 2006 showed mild esophagitis as well as some mild deformity of the prepyloric area. In January 2012 she had a colonoscopy as well as a repeat upper endoscopy (done for chest pain, GERD, nausea, vomiting). Stenosis at the pylorus was found, the endoscope could not be passed. Patient hospitalized mid August 2012 with nausea vomiting.  A repeat endoscopy with balloon dilation was done. Findings included a tight pyloric stenosis precluding passage of endoscope beyond the pylorus despite balloon dilation up to 15mm. A followup upper GI series showed a narrowed pyloric channel, mildly irregular pyloric lumen and delayed gastric emptying secondary to edema. There was delayed filling of the duodenum. Patient was discharged home, her nausea and vomiting attributed to viral gastroenteritis and pyloric stricture felt to be incidental finding.  Patient was last seen in the office November 2012 when she presented with recurrent nausea and vomiting. Symptoms  felt to be secondary to recent kidney infection and / or pain medications. She was advised to take a daily proton pump inhibitor, follow antireflux measures and follow a low-fat diet. Patient had Dexilant on home med list but she has not been taking it. She has taken Prilosec in the past but cannot remember when she stopped it. Patient still requires daily narcotics for chronic back pain.   Patient worked in today for evaluation of ongoing nausea, vomiting and abdominal pain. A CT scan of the abdomen and pelvis with contrast in ED11/27/13 revealed thickening in the region of the pylorus and proximal duodenum. Comprehensive metabolic profile was essentially unremarkable. White count elevated at 13.9, hemoglobin 17.9.  Patient apparenlty reported diarrhea to ED, work up  not done.  Patient tolerating liquids for the most part.   Current Medications, Allergies, Past Medical History, Past Surgical History, Family History and Social History were reviewed in [employer] record.   Physical Exam: General: Well developed , white female in no acute distress Head: Normocephalic and atraumatic Eyes:  sclerae anicteric, conjunctiva pink  Ears: Normal auditory acuity Lungs: Clear throughout to auscultation Heart: Regular rate and rhythm Abdomen: Soft, non tender and non distended. No masses, no hepatomegaly. Normal bowel sounds Rectal: No fecal impaction, no stool in vault Musculoskeletal: Symmetrical with no gross deformities  Extremities: No edema  Neurological: Alert oriented x 4, grossly nonfocal Psychological:  Alert and cooperative. Emotional, crying.   Assessment and Recommendations:  1. progressive nausea and vomiting, suspect secondary to known history of pyloric stenosis. Patient may need surgical evaluation as this is a recurring problem. Will obtain an upper GI series for further evaluation. Pending results, patient will be tentatively scheduled for EGD with dilation. Procedure will be done with propofol at [facility] The benefits, risks, and potential complications of EGD with possible biopsies and/or dilation were discussed with the patient and she agrees to proceed. In the meantime, advised liquid diet, anti-emetics as needed (refill for Phenergan given) and twice daily PPI (samples of Prilosec given).  2. diarrhea, acute. Recent CT scan did not show any evidence for colitis. Still need to rule out infectious etiology. Will obtain stool studies. Patient will be called with test results and further recommendations

## 2012-09-27 NOTE — Telephone Encounter (Signed)
Melanie Caldwell from [facility] called patient sitting in their lobby crying due to abdominal pain, diarrhea.  She will come in and see Willette Cluster RNP today at 3:00

## 2012-09-28 NOTE — Progress Notes (Signed)
Reviewed and agree with management plans.  Brandyce Dimario T. Larayne Baxley MD FACG  

## 2012-09-29 ENCOUNTER — Other Ambulatory Visit: Payer: Medicare Other

## 2012-09-29 ENCOUNTER — Ambulatory Visit (HOSPITAL_COMMUNITY)
Admission: RE | Admit: 2012-09-29 | Discharge: 2012-09-29 | Disposition: A | Discharge status: Home / Self Care | Payer: Medicare Other | Source: Ambulatory Visit | Attending: Nurse Practitioner | Admitting: Nurse Practitioner

## 2012-09-29 DIAGNOSIS — R112 Nausea with vomiting, unspecified: Secondary | ICD-10-CM

## 2012-09-29 DIAGNOSIS — K219 Gastro-esophageal reflux disease without esophagitis: Secondary | ICD-10-CM | POA: Insufficient documentation

## 2012-09-29 DIAGNOSIS — R197 Diarrhea, unspecified: Secondary | ICD-10-CM

## 2012-09-29 DIAGNOSIS — K311 Adult hypertrophic pyloric stenosis: Secondary | ICD-10-CM

## 2012-09-29 IMAGING — RF DG UGI W/ HIGH DENSITY W/KUB
14 of 24 series · 14 of 24 positions shown · non-contrast
Comparison: CT abdomen pelvis of [DATE]

CLINICAL DATA: Nausea, vomiting

UPPER GI SERIES WITH KUB
TECHNIQUE: Routine upper GI series was performed with thin and
high density barium.
Fluoroscopy Time: 2.1 minutes

[Series 1: run · 1 of 1 slices shown (1 of 14)]
[im 1/1]
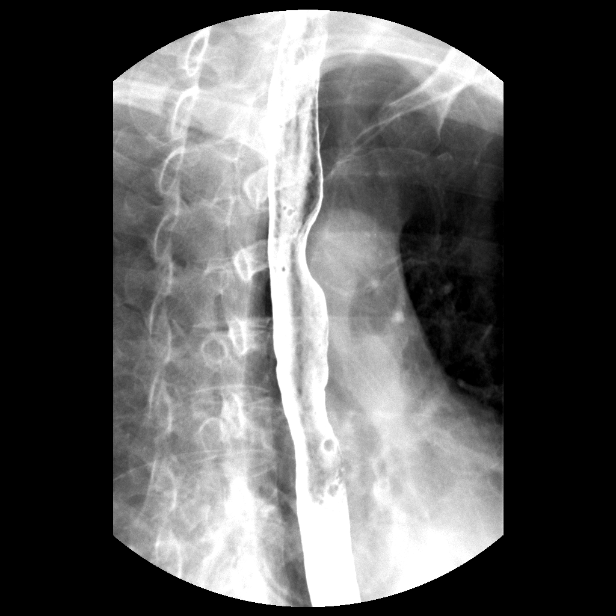

[Series 3: run · 1 of 1 slices shown (2 of 14)]
[im 1/1]
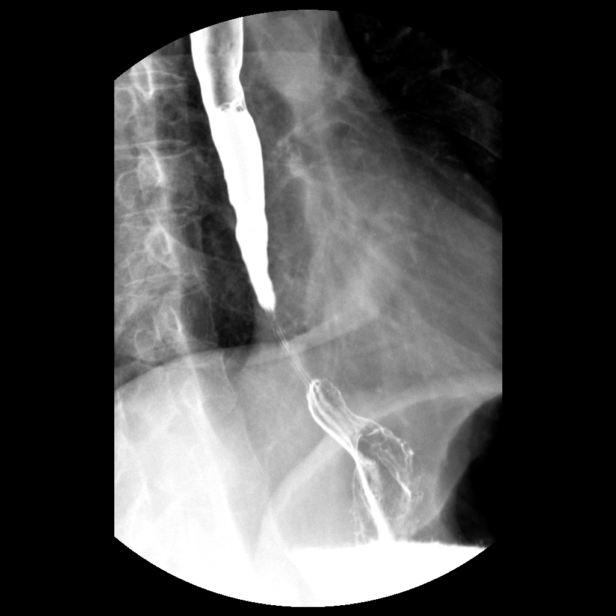

[Series 5: run · 1 of 1 slices shown (3 of 14)]
[im 1/1]
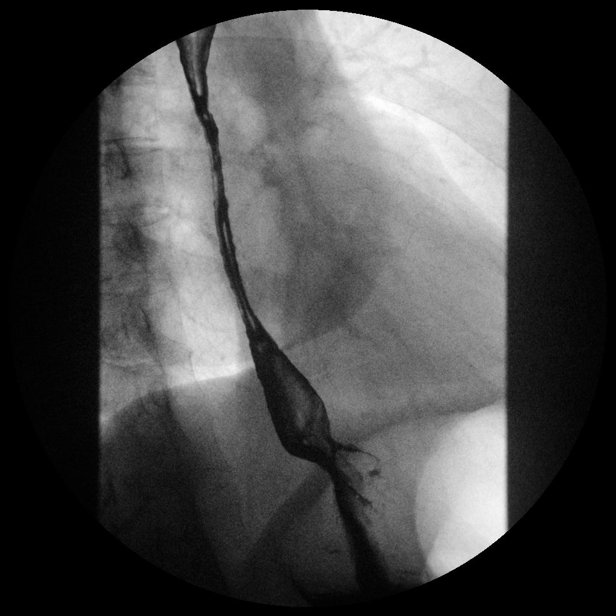

[Series 7: run · 1 of 1 slices shown (4 of 14)]
[im 1/1]
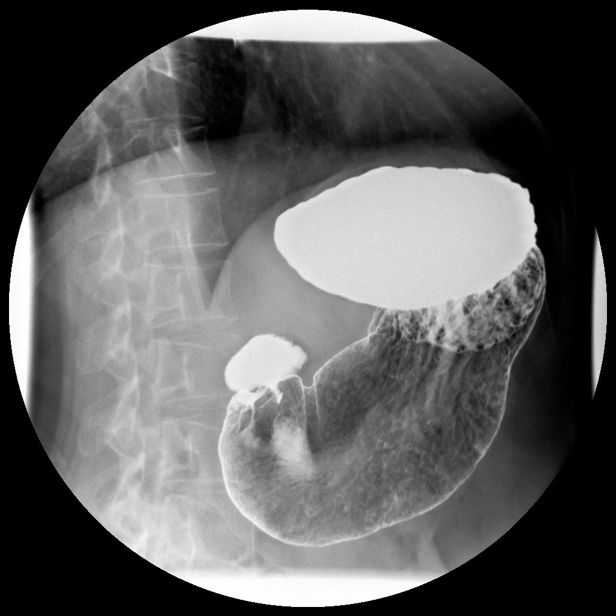

[Series 8: run · 1 of 1 slices shown (5 of 14)]
[im 1/1]
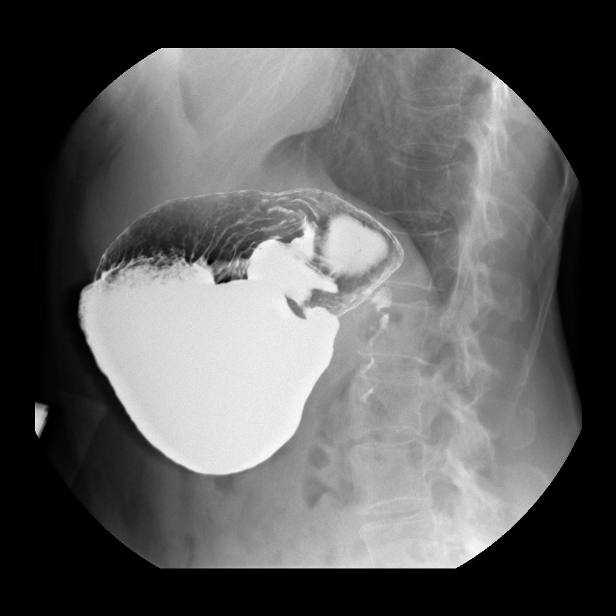

[Series 10: run · 1 of 1 slices shown (6 of 14)]
[im 1/1]
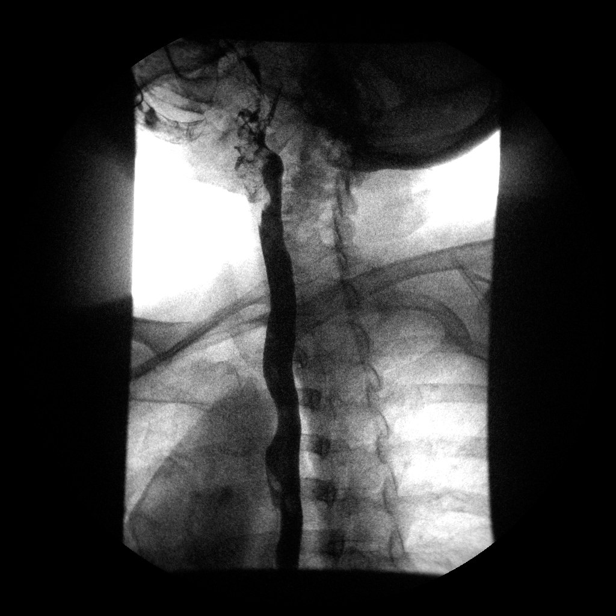

[Series 12: run · 1 of 1 slices shown (7 of 14)]
[im 1/1]
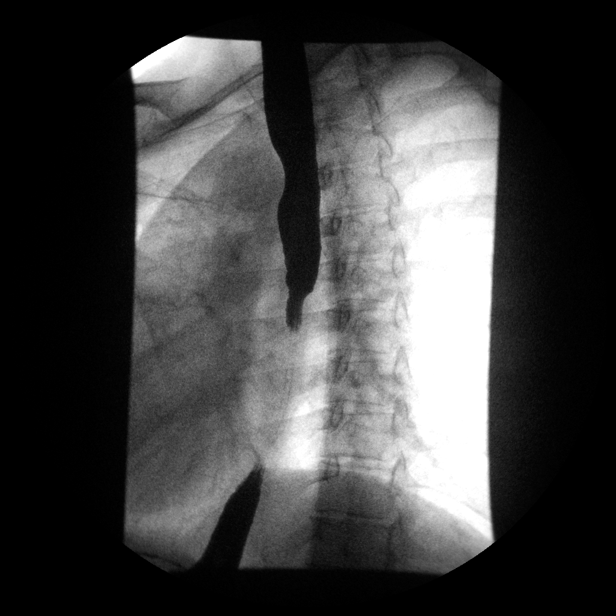

[Series 13: run · 1 of 1 slices shown (8 of 14)]
[im 1/1]
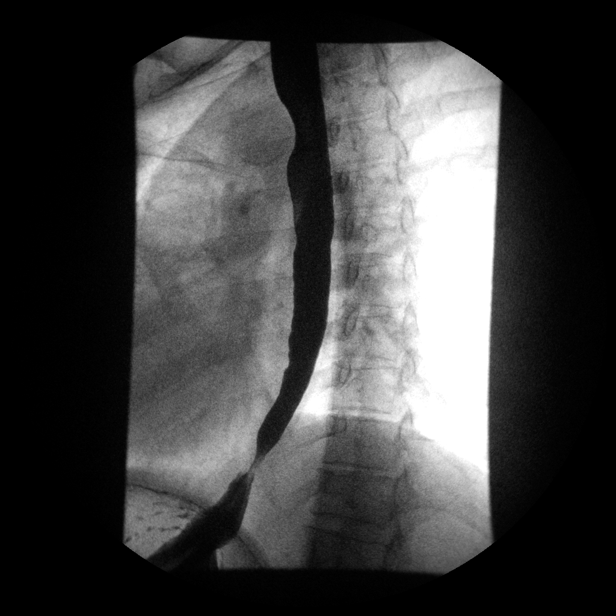

[Series 15: run · 1 of 1 slices shown (9 of 14)]
[im 1/1]
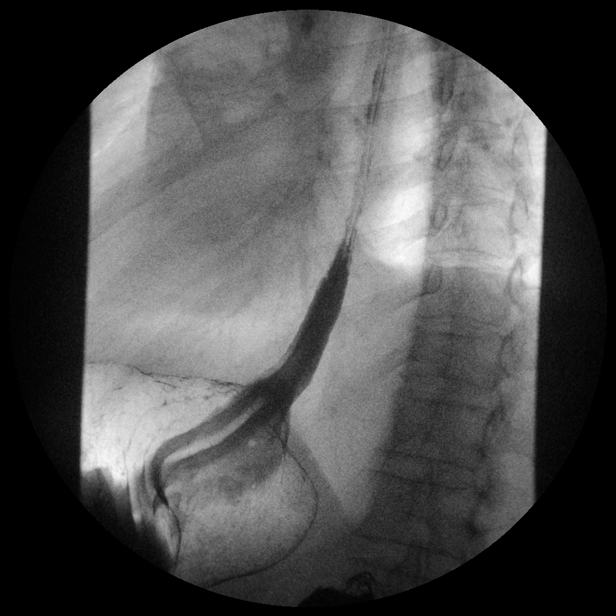

[Series 17: run · 1 of 1 slices shown (10 of 14)]
[im 1/1]
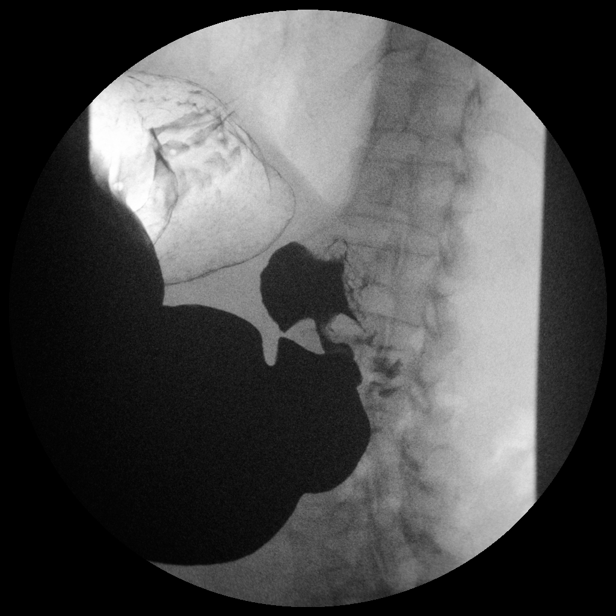

[Series 19: run · 1 of 1 slices shown (11 of 14)]
[im 1/1]
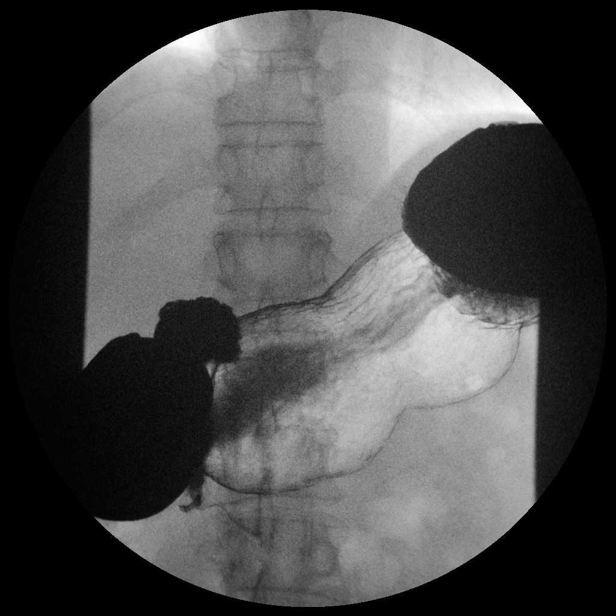

[Series 20: run · 1 of 1 slices shown (12 of 14)]
[im 1/1]
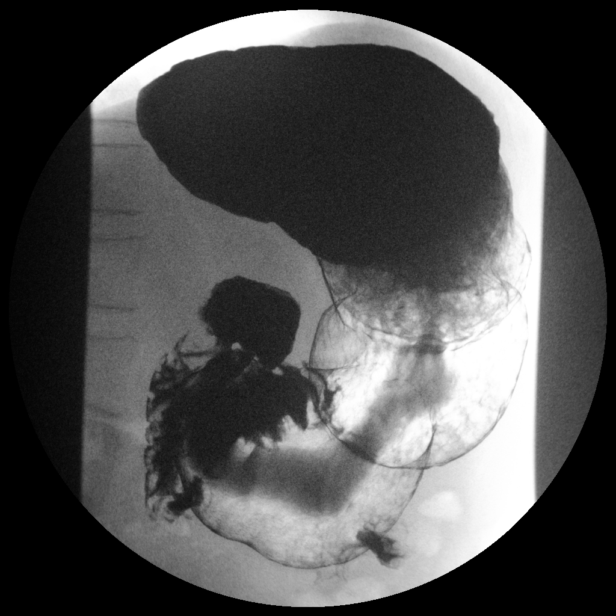

[Series 22: run · 1 of 1 slices shown (13 of 14)]
[im 1/1]
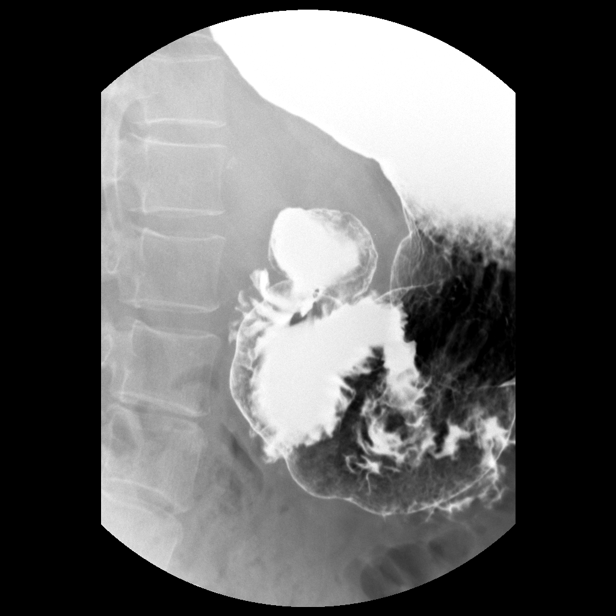

[Series 24: run · 1 of 1 slices shown (14 of 14)]
[im 1/1]
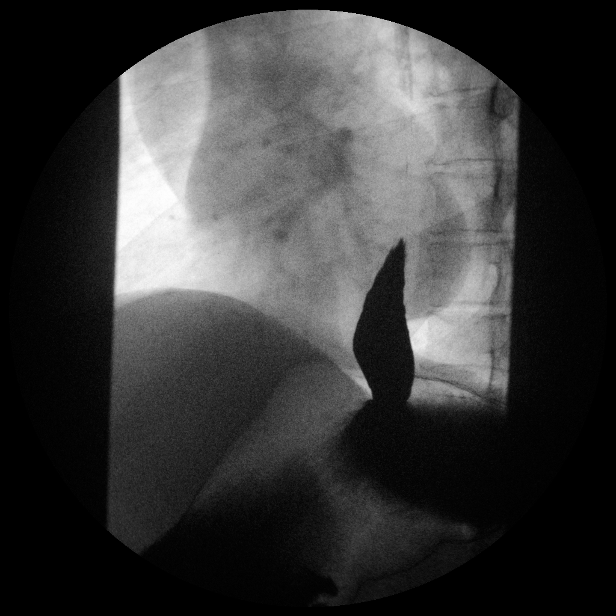

[14 of 24 positions shown; findings below may reference images not displayed]

FINDINGS: A preliminary film of the abdomen shows a nonspecific
bowel gas pattern.  No opaque calculi are noted.  There are
calcifications in the mid pelvis most consistent with calcified
uterine fibroids.  There is a slight lumbar scoliosis convex to the
left.

A double contrast study was performed.  The mucosa of the esophagus
is unremarkable.  A single contrast study shows the swallowing
mechanism to be normal although there are moderate tertiary
contractions in the mid and distal esophagus.  No hiatal hernia is
seen.  There is moderate gastroesophageal reflux demonstrated.  A
barium pill was given at the end of the study which did pass into
the stomach without delay.

The stomach is normal in contour and peristalsis.  The pyloric
channel is slightly elongated which may be due to scarring from
prior peptic ulcer disease.  However, no active ulceration is seen.
The duodenal bulb fills and the duodenal loop is in normal
position.
IMPRESSION: 1.  Moderate gastroesophageal reflux.  The barium pill passes into
the stomach without delay.
2.  Mild to moderate tertiary contractions in the mid and distal
esophagus.
3.  Somewhat elongated pyloric channel may be due to prior peptic
ulcer disease but no active ulceration is seen.

## 2012-09-30 LAB — CLOSTRIDIUM DIFFICILE BY PCR: Toxigenic C. Difficile by PCR: DETECTED — CR

## 2012-10-03 ENCOUNTER — Other Ambulatory Visit: Payer: Self-pay | Admitting: *Deleted

## 2012-10-03 LAB — STOOL CULTURE

## 2012-10-03 MED ORDER — METRONIDAZOLE 250 MG PO TABS: 250.0000 mg | ORAL_TABLET | Freq: Three times a day (TID) | ORAL | Status: AC

## 2012-10-04 ENCOUNTER — Ambulatory Visit: Payer: Medicare Other | Admitting: Gastroenterology

## 2012-10-05 ENCOUNTER — Encounter (HOSPITAL_COMMUNITY): Payer: Self-pay | Admitting: Pharmacy Technician

## 2012-10-05 ENCOUNTER — Telehealth: Payer: Self-pay | Admitting: *Deleted

## 2012-10-05 NOTE — Telephone Encounter (Signed)
Please cancel EGD. Complete all of Flagyl prescription. Stay on a PPI daily long term given prior h/o pyloric stenosis

## 2012-10-05 NOTE — Telephone Encounter (Signed)
Message copied by Daphine Deutscher on Wed Oct 05, 2012 10:56 AM ------      Message from: Daphine Deutscher      Created: Mon Oct 03, 2012  1:33 PM       Call and check on patient if better cancel EGD                      Rene Kocher, please check on patient. If nausea and vomiting get better with C-diff treatment then we should hold off on EGD next week. She needs to call with a condition update later this week. Thanksnt. If better, cancel EGD .

## 2012-10-05 NOTE — Telephone Encounter (Signed)
Cancelled EGD with Noreene Larsson at [facility] endo. Patient notified. She is on Prilosec and understands to stay on it daily. She will complete to Flagyl.

## 2012-10-05 NOTE — Telephone Encounter (Signed)
Patient is feeling better since starting Flagyl. Eating light and taking po fluids well. No diarrhea. Should the EGD on 10/10/12 be cancelled? Please, advise.

## 2012-10-10 ENCOUNTER — Encounter (HOSPITAL_COMMUNITY): Admission: RE | Payer: Self-pay | Source: Ambulatory Visit

## 2012-10-10 ENCOUNTER — Ambulatory Visit (HOSPITAL_COMMUNITY): Admission: RE | Admit: 2012-10-10 | Payer: Medicare Other | Source: Ambulatory Visit | Admitting: Gastroenterology

## 2012-10-10 SURGERY — EGD (ESOPHAGOGASTRODUODENOSCOPY)
Anesthesia: Monitor Anesthesia Care

## 2012-10-12 ENCOUNTER — Telehealth: Payer: Self-pay | Admitting: Gastroenterology

## 2012-10-12 NOTE — Telephone Encounter (Signed)
Patient reports that she has a small red "bumps on my abdomen" she has had a 2 day history of itching.  She was started on a metronidazole for c-diff last week.  She also has started using a new body wash about 4-5 days ago.  She said the itching is very mild, but wanted to let us know.  She denies any swelling of her face, no itching of her tongue or lips, she denies SOB.  She would like to continue to take the metronidazole as she only has a few days left.  She is asked to take Benadryl 25 mg, and hold the metronidazole until I can speak with Willette Cluster RNP .  I did discuss with Gunnar Fusi she would like the patient to continue the metronidazole as long as her symptoms dont't worsen.  She is also asked to stop the new body wash and continue benadryl 2 x a day while on metronidazole.  She is asked to call back if anything changes or her symptoms worsen.

## 2012-12-29 ENCOUNTER — Emergency Department (HOSPITAL_COMMUNITY)
Admission: EM | Admit: 2012-12-29 | Discharge: 2012-12-29 | Disposition: A | Discharge status: Home / Self Care | Payer: Medicare Other | Attending: Emergency Medicine | Admitting: Emergency Medicine

## 2012-12-29 ENCOUNTER — Encounter (HOSPITAL_COMMUNITY): Payer: Self-pay | Admitting: *Deleted

## 2012-12-29 ENCOUNTER — Emergency Department (HOSPITAL_COMMUNITY): Payer: Medicare Other

## 2012-12-29 DIAGNOSIS — Z79899 Other long term (current) drug therapy: Secondary | ICD-10-CM | POA: Insufficient documentation

## 2012-12-29 DIAGNOSIS — J4489 Other specified chronic obstructive pulmonary disease: Secondary | ICD-10-CM | POA: Insufficient documentation

## 2012-12-29 DIAGNOSIS — Z77098 Contact with and (suspected) exposure to other hazardous, chiefly nonmedicinal, chemicals: Secondary | ICD-10-CM | POA: Insufficient documentation

## 2012-12-29 DIAGNOSIS — Z87442 Personal history of urinary calculi: Secondary | ICD-10-CM | POA: Insufficient documentation

## 2012-12-29 DIAGNOSIS — F17200 Nicotine dependence, unspecified, uncomplicated: Secondary | ICD-10-CM | POA: Insufficient documentation

## 2012-12-29 DIAGNOSIS — Z8659 Personal history of other mental and behavioral disorders: Secondary | ICD-10-CM | POA: Insufficient documentation

## 2012-12-29 DIAGNOSIS — Z8719 Personal history of other diseases of the digestive system: Secondary | ICD-10-CM | POA: Insufficient documentation

## 2012-12-29 DIAGNOSIS — R062 Wheezing: Secondary | ICD-10-CM | POA: Insufficient documentation

## 2012-12-29 DIAGNOSIS — Z8739 Personal history of other diseases of the musculoskeletal system and connective tissue: Secondary | ICD-10-CM | POA: Insufficient documentation

## 2012-12-29 DIAGNOSIS — Z8742 Personal history of other diseases of the female genital tract: Secondary | ICD-10-CM | POA: Insufficient documentation

## 2012-12-29 DIAGNOSIS — R0602 Shortness of breath: Secondary | ICD-10-CM | POA: Insufficient documentation

## 2012-12-29 DIAGNOSIS — F329 Major depressive disorder, single episode, unspecified: Secondary | ICD-10-CM | POA: Insufficient documentation

## 2012-12-29 DIAGNOSIS — J449 Chronic obstructive pulmonary disease, unspecified: Secondary | ICD-10-CM | POA: Insufficient documentation

## 2012-12-29 DIAGNOSIS — F411 Generalized anxiety disorder: Secondary | ICD-10-CM | POA: Insufficient documentation

## 2012-12-29 DIAGNOSIS — R0789 Other chest pain: Secondary | ICD-10-CM | POA: Insufficient documentation

## 2012-12-29 DIAGNOSIS — F3289 Other specified depressive episodes: Secondary | ICD-10-CM | POA: Insufficient documentation

## 2012-12-29 DIAGNOSIS — Z87440 Personal history of urinary (tract) infections: Secondary | ICD-10-CM | POA: Insufficient documentation

## 2012-12-29 HISTORY — DX: Other intervertebral disc degeneration, lumbar region: M51.36

## 2012-12-29 HISTORY — DX: Other intervertebral disc displacement, lumbar region: M51.26

## 2012-12-29 HISTORY — DX: Other intervertebral disc degeneration, lumbar region without mention of lumbar back pain or lower extremity pain: M51.369

## 2012-12-29 IMAGING — CR DG CHEST 1V PORT
1 series · 1 of 1 positions shown · non-contrast
Comparison: Chest radiograph [DATE]

CLINICAL DATA: Inhalation injury

PORTABLE CHEST - 1 VIEW

[AP]
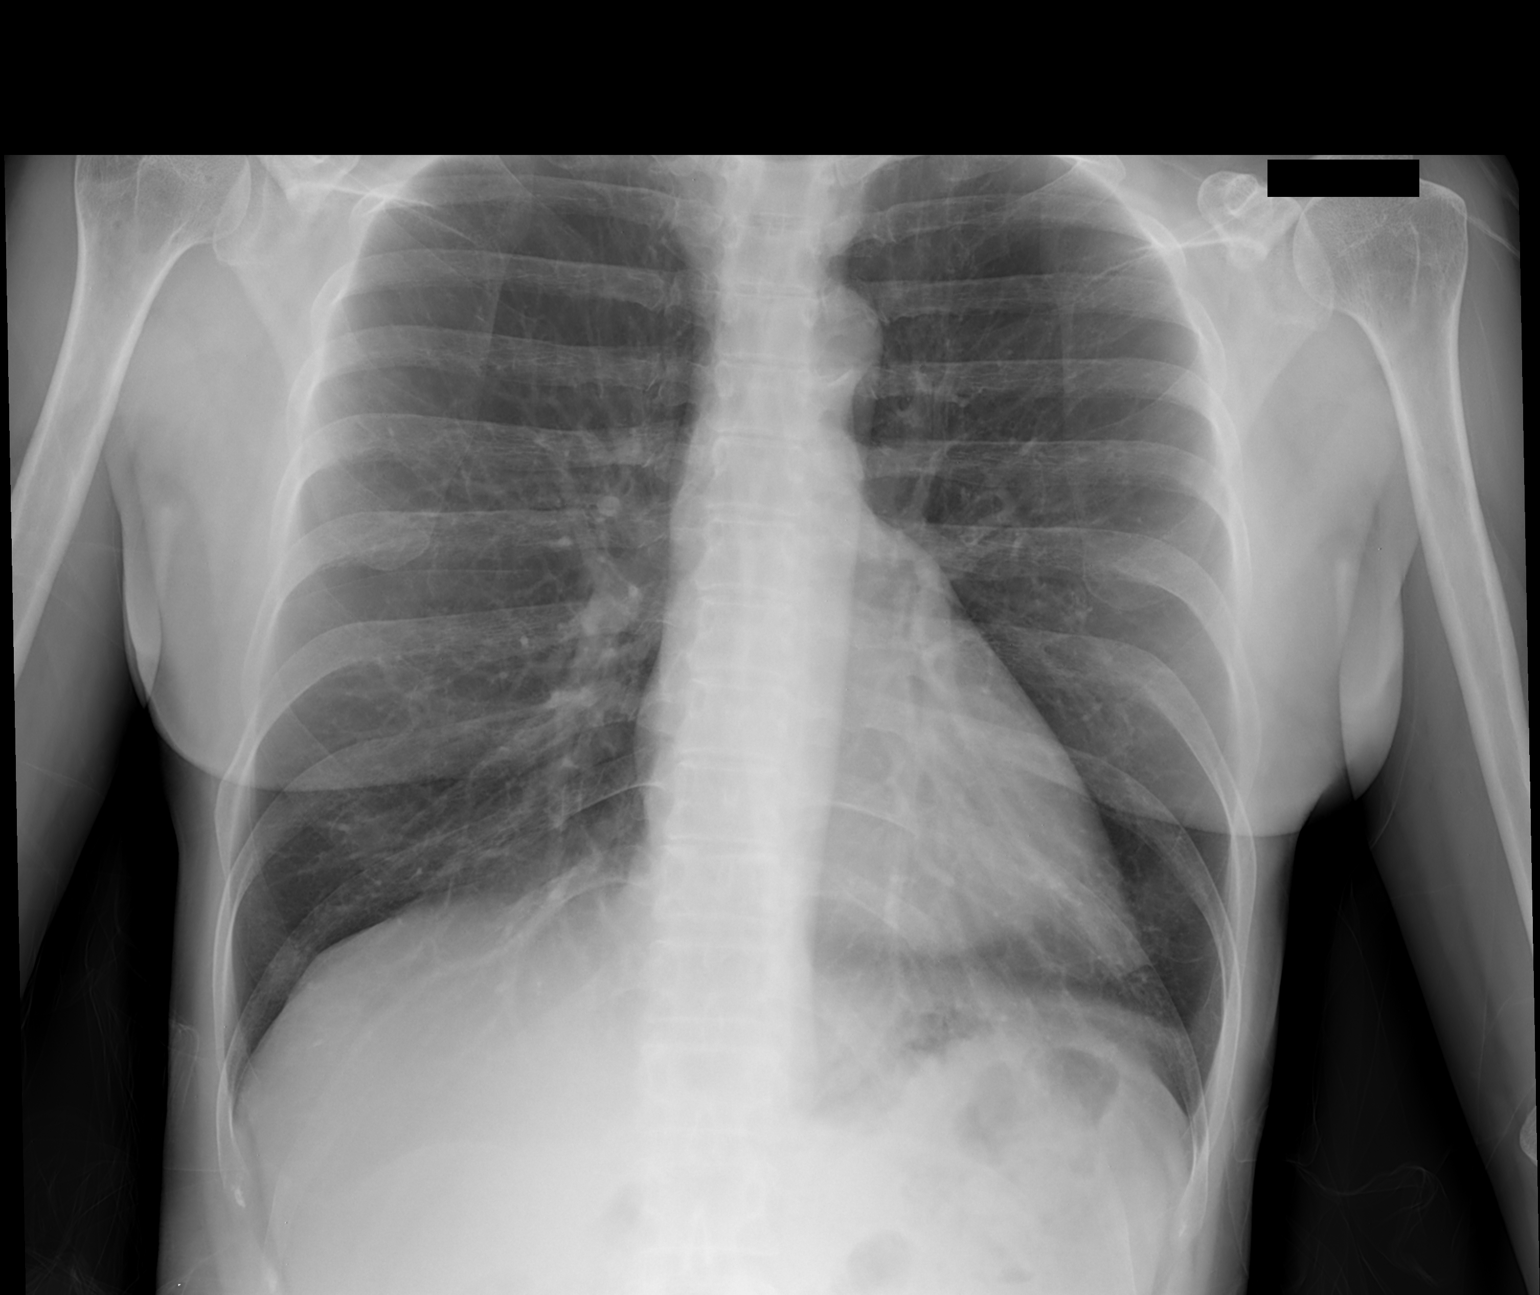

[1 of 1 positions shown; findings below may reference images not displayed]

FINDINGS: Normal mediastinum and heart silhouette.  Costophrenic
angles are clear.  No effusion, infiltrate, or pneumothorax. Lungs
are hyperinflated similar to prior.
IMPRESSION: No acute cardiopulmonary process.  Lungs are hyperinflated similar
to prior.

## 2012-12-29 MED ORDER — ALBUTEROL SULFATE (5 MG/ML) 0.5% IN NEBU
2.5000 mg | INHALATION_SOLUTION | Freq: Once | RESPIRATORY_TRACT | Status: AC
  Administered 2012-12-29: 2.5 mg via RESPIRATORY_TRACT
  Filled 2012-12-29: qty 0.5

## 2012-12-29 MED ORDER — IPRATROPIUM BROMIDE 0.02 % IN SOLN
0.5000 mg | Freq: Once | RESPIRATORY_TRACT | Status: AC
  Administered 2012-12-29: 0.5 mg via RESPIRATORY_TRACT
  Filled 2012-12-29: qty 2.5

## 2012-12-29 MED ORDER — ALBUTEROL SULFATE HFA 108 (90 BASE) MCG/ACT IN AERS: 1.0000 | INHALATION_SPRAY | Freq: Four times a day (QID) | RESPIRATORY_TRACT | Status: DC | PRN

## 2012-12-29 MED ORDER — BENZONATATE 100 MG PO CAPS: 100.0000 mg | ORAL_CAPSULE | Freq: Three times a day (TID) | ORAL | Status: DC

## 2012-12-29 NOTE — ED Notes (Signed)
Melanie Caldwell  Aware of needed breathing treatment and peak flow

## 2012-12-29 NOTE — ED Notes (Signed)
Pt's grandson given [place] sandwich and beverage per pt's request.  Also given warm blankets
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
## 2012-12-29 NOTE — ED Provider Notes (Signed)
History     CSN: [id number]  Arrival date & time 12/29/12  1738   First MD Initiated Contact with Patient 12/29/12 1831      No chief complaint on file.   (Consider location/radiation/quality/duration/timing/severity/associated sxs/prior treatment) HPI Comments: Pt comes in with cc of cough. She was exposed to fumes from drano like solution yday. She states that since then she has been having some chest discomfort, and cough. She also felt like she was wheezing. No n/v/f/c. No dizziness, LOC. No known lung or heart dz. Pt has ventilated the house well since the incidence.   The history is provided by the patient.    Past Medical History  Diagnosis Date  . Depression   . GERD (gastroesophageal reflux disease)   . History of nephrolithiasis   . History of UTI   . ADD (attention deficit disorder)   . Recreational drug use     History  . Tobacco abuse   . Asthma   . Anxiety disorder   . COPD (chronic obstructive pulmonary disease)   . Fibroid uterus   . Bulging lumbar disc   . DDD (degenerative disc disease), lumbar     Past Surgical History  Procedure Laterality Date  . Lithotripsy  1985  . Tonsillectomy    . Exploration middle ear      with revision of left stapedectomy and replacement of prosthesis  . Fetal surgery for congenital hernia    . Hernia repair    . Esophageal dilation      Family History  Problem Relation Age of Onset  . Coronary artery disease Mother   . Hypertension Mother   . Diabetes Father   . Colon cancer Maternal Grandfather     History  Substance Use Topics  . Smoking status: Current Every Day Smoker    Types: Cigarettes  . Smokeless tobacco: Never Used     Comment: Patient given counseling sheet to quit smoking; now smoking :hookah with 1 cigarette daily....  . Alcohol Use: Yes     Comment: hx of heavy Etoh use in the past; no socially    OB History   Grav Para Term Preterm Abortions TAB SAB Ect Mult Living                   Review of Systems  Constitutional: Negative for activity change.  HENT: Negative for neck pain.   Respiratory: Positive for cough, shortness of breath and wheezing.   Cardiovascular: Negative for chest pain.  Gastrointestinal: Negative for nausea, vomiting and abdominal pain.  Genitourinary: Negative for dysuria.  Neurological: Negative for headaches.    Allergies  Adhesive; Cefuroxime axetil; and Codeine  Home Medications   Current Outpatient Rx  Name  Route  Sig  Dispense  Refill  . amphetamine-dextroamphetamine (ADDERALL) 30 MG tablet   Oral   Take 30 mg by mouth 2 (two) times daily.          . citalopram (CELEXA) 40 MG tablet   Oral   Take 40 mg by mouth daily.          . diazepam (VALIUM) 10 MG tablet   Oral   Take 10 mg by mouth every 8 (eight) hours as needed. anxiety         . HYDROcodone-acetaminophen (NORCO) 10-325 MG per tablet   Oral   Take 1 tablet by mouth 2 (two) times daily as needed. pain         . meloxicam (MOBIC) 15 MG  tablet   Oral   Take 15 mg by mouth daily as needed for pain.         . potassium chloride SA (K-DUR,KLOR-CON) 20 MEQ tablet   Oral   Take 40 mEq by mouth daily.         . traZODone (DESYREL) 100 MG tablet   Oral   Take 100-300 mg by mouth at bedtime.         Marland Kitchen albuterol (PROVENTIL HFA;VENTOLIN HFA) 108 (90 BASE) MCG/ACT inhaler   Inhalation   Inhale 1-2 puffs into the lungs every 6 (six) hours as needed for wheezing.   1 Inhaler   0   . benzonatate (TESSALON) 100 MG capsule   Oral   Take 1 capsule (100 mg total) by mouth every 8 (eight) hours.   21 capsule   0     BP 119/58  Pulse 77  Temp(Src) 98.6 F (37 C) (Oral)  Resp 18  SpO2 99%  Physical Exam  Nursing note and vitals reviewed. Constitutional: She is oriented to person, place, and time. She appears well-developed and well-nourished.  HENT:  Head: Normocephalic and atraumatic.  Eyes: EOM are normal. Pupils are equal, round, and reactive  to light.  Neck: Neck supple.  Cardiovascular: Normal rate, regular rhythm and normal heart sounds.   No murmur heard. Pulmonary/Chest: Effort normal. No respiratory distress.  Abdominal: Soft. She exhibits no distension. There is no tenderness. There is no rebound and no guarding.  Neurological: She is alert and oriented to person, place, and time.  Skin: Skin is warm and dry.    ED Course  Procedures (including critical care time)  Labs Reviewed - No data to display Dg Chest [facility] 1 View  12/29/2012  *RADIOLOGY REPORT*  Clinical Data: Inhalation injury  PORTABLE CHEST - 1 VIEW  Comparison: Chest radiograph 08/19/2011  Findings: Normal mediastinum and heart silhouette.  Costophrenic angles are clear.  No effusion, infiltrate, or pneumothorax. Lungs are hyperinflated similar to prior.  IMPRESSION: No acute cardiopulmonary process.  Lungs are hyperinflated similar to prior.   Original Report Authenticated By: Genevive Bi, M.D.      1. Exposure to chemical inhalation       MDM  Pt comes in w/ cc of exposure to fumes from drano like material. Her lung exam is clear 0 but she has some cough and chest discomfort - so we will give her a breathing tx x 1. CXR is negative for any acute process.Pt asked to see her PCP if not better in 1 week.   Derwood Kaplan, MD 12/29/12 2308

## 2013-12-29 ENCOUNTER — Emergency Department (HOSPITAL_COMMUNITY)
Admission: EM | Admit: 2013-12-29 | Discharge: 2013-12-30 | Disposition: A | Discharge status: Home / Self Care | Payer: Medicare Other | Attending: Emergency Medicine | Admitting: Emergency Medicine

## 2013-12-29 ENCOUNTER — Emergency Department (HOSPITAL_COMMUNITY): Payer: Medicare Other

## 2013-12-29 ENCOUNTER — Encounter (HOSPITAL_COMMUNITY): Payer: Self-pay | Admitting: Emergency Medicine

## 2013-12-29 DIAGNOSIS — R1110 Vomiting, unspecified: Secondary | ICD-10-CM | POA: Insufficient documentation

## 2013-12-29 DIAGNOSIS — R197 Diarrhea, unspecified: Secondary | ICD-10-CM | POA: Insufficient documentation

## 2013-12-29 DIAGNOSIS — Z79899 Other long term (current) drug therapy: Secondary | ICD-10-CM | POA: Insufficient documentation

## 2013-12-29 DIAGNOSIS — Z8719 Personal history of other diseases of the digestive system: Secondary | ICD-10-CM | POA: Insufficient documentation

## 2013-12-29 DIAGNOSIS — F3289 Other specified depressive episodes: Secondary | ICD-10-CM | POA: Insufficient documentation

## 2013-12-29 DIAGNOSIS — F17200 Nicotine dependence, unspecified, uncomplicated: Secondary | ICD-10-CM | POA: Insufficient documentation

## 2013-12-29 DIAGNOSIS — J45901 Unspecified asthma with (acute) exacerbation: Secondary | ICD-10-CM | POA: Insufficient documentation

## 2013-12-29 DIAGNOSIS — F329 Major depressive disorder, single episode, unspecified: Secondary | ICD-10-CM | POA: Insufficient documentation

## 2013-12-29 DIAGNOSIS — Z8742 Personal history of other diseases of the female genital tract: Secondary | ICD-10-CM | POA: Insufficient documentation

## 2013-12-29 DIAGNOSIS — Z8739 Personal history of other diseases of the musculoskeletal system and connective tissue: Secondary | ICD-10-CM | POA: Insufficient documentation

## 2013-12-29 DIAGNOSIS — Z87440 Personal history of urinary (tract) infections: Secondary | ICD-10-CM | POA: Insufficient documentation

## 2013-12-29 DIAGNOSIS — F411 Generalized anxiety disorder: Secondary | ICD-10-CM | POA: Insufficient documentation

## 2013-12-29 DIAGNOSIS — G8929 Other chronic pain: Secondary | ICD-10-CM | POA: Insufficient documentation

## 2013-12-29 DIAGNOSIS — Z87442 Personal history of urinary calculi: Secondary | ICD-10-CM | POA: Insufficient documentation

## 2013-12-29 DIAGNOSIS — R1031 Right lower quadrant pain: Secondary | ICD-10-CM | POA: Insufficient documentation

## 2013-12-29 DIAGNOSIS — R109 Unspecified abdominal pain: Secondary | ICD-10-CM

## 2013-12-29 DIAGNOSIS — F988 Other specified behavioral and emotional disorders with onset usually occurring in childhood and adolescence: Secondary | ICD-10-CM | POA: Insufficient documentation

## 2013-12-29 LAB — URINALYSIS, ROUTINE W REFLEX MICROSCOPIC
Glucose, UA: NEGATIVE mg/dL
Ketones, ur: NEGATIVE mg/dL
Nitrite: NEGATIVE
Protein, ur: 30 mg/dL — AB
Specific Gravity, Urine: 1.026 (ref 1.005–1.030)
Urobilinogen, UA: 1 mg/dL (ref 0.0–1.0)
pH: 5.5 (ref 5.0–8.0)

## 2013-12-29 LAB — COMPREHENSIVE METABOLIC PANEL WITH GFR
ALT: 9 U/L (ref 0–35)
AST: 15 U/L (ref 0–37)
Albumin: 4.3 g/dL (ref 3.5–5.2)
Alkaline Phosphatase: 91 U/L (ref 39–117)
BUN: 4 mg/dL — ABNORMAL LOW (ref 6–23)
CO2: 25 meq/L (ref 19–32)
Calcium: 10.4 mg/dL (ref 8.4–10.5)
Chloride: 96 meq/L (ref 96–112)
Creatinine, Ser: 0.5 mg/dL (ref 0.50–1.10)
GFR calc Af Amer: >90 mL/min
GFR calc non Af Amer: >90 mL/min
Glucose, Bld: 125 mg/dL — ABNORMAL HIGH (ref 70–99)
Potassium: 3 meq/L — ABNORMAL LOW (ref 3.7–5.3)
Sodium: 138 meq/L (ref 137–147)
Total Bilirubin: 0.3 mg/dL (ref 0.3–1.2)
Total Protein: 7.8 g/dL (ref 6.0–8.3)

## 2013-12-29 LAB — CBC WITH DIFFERENTIAL/PLATELET
BASOS PCT: 1 % (ref 0–1)
Basophils Absolute: 0.1 10*3/uL (ref 0.0–0.1)
Eosinophils Absolute: 0 10*3/uL (ref 0.0–0.7)
Eosinophils Relative: 0 % (ref 0–5)
HCT: 43.1 % (ref 36.0–46.0)
HEMOGLOBIN: 15.3 g/dL — AB (ref 12.0–15.0)
LYMPHS ABS: 3.1 10*3/uL (ref 0.7–4.0)
Lymphocytes Relative: 32 % (ref 12–46)
MCH: 32 pg (ref 26.0–34.0)
MCHC: 35.5 g/dL (ref 30.0–36.0)
MCV: 90.2 fL (ref 78.0–100.0)
MONOS PCT: 7 % (ref 3–12)
Monocytes Absolute: 0.7 10*3/uL (ref 0.1–1.0)
NEUTROS ABS: 5.9 10*3/uL (ref 1.7–7.7)
NEUTROS PCT: 60 % (ref 43–77)
PLATELETS: 253 10*3/uL (ref 150–400)
RBC: 4.78 MIL/uL (ref 3.87–5.11)
RDW: 12.4 % (ref 11.5–15.5)
WBC: 9.8 10*3/uL (ref 4.0–10.5)

## 2013-12-29 LAB — URINE MICROSCOPIC-ADD ON

## 2013-12-29 LAB — LIPASE, BLOOD: Lipase: 23 U/L (ref 11–59)

## 2013-12-29 IMAGING — CT CT ABD-PELV W/ CM
1 of 3 series · 14 of 32 positions shown, 19 images · IV contrast (OMNIPAQUE 300)
Comparison: [DATE]

CLINICAL DATA: Back pain and flank pain

EXAM:
CT ABDOMEN AND PELVIS WITH CONTRAST
TECHNIQUE: Multidetector CT imaging of the abdomen and pelvis was performed
using the standard protocol following bolus administration of
intravenous contrast.
CONTRAST:  50mL OMNIPAQUE IOHEXOL 300 MG/ML SOLN, 80mL OMNIPAQUE
IOHEXOL 300 MG/ML SOLN

[Series 2: abd/pel with · axial · 0.75mm/px · z∈[-298,+62]mm · 14 of 82 slices shown, 19 images]
[im 5/82  soft-tissue]
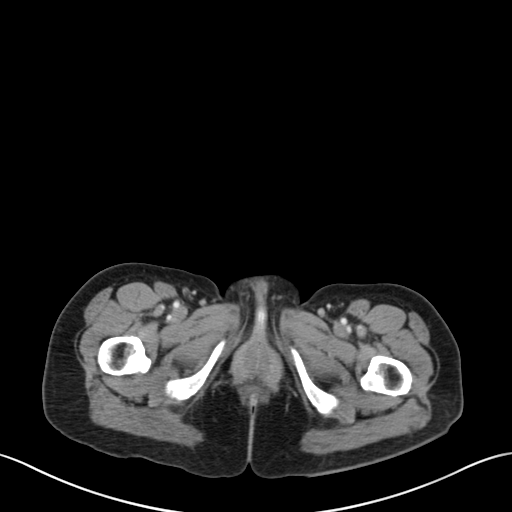
[im 5/82  bone]
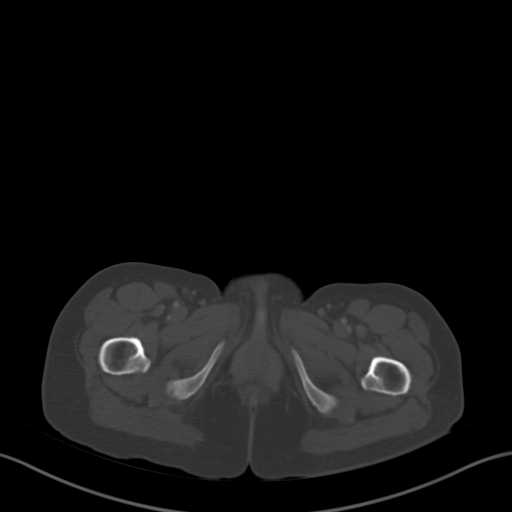
[im 13/82  soft-tissue]
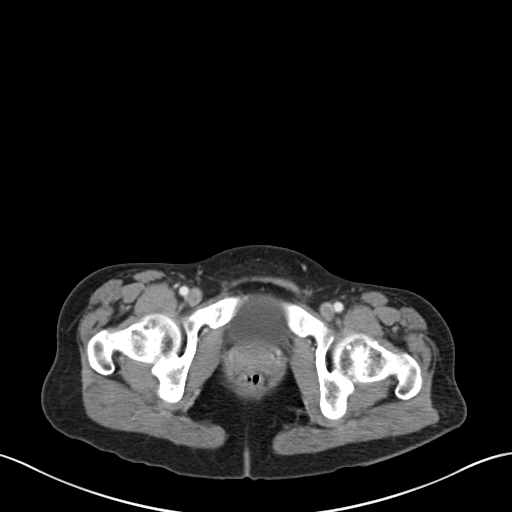
[im 18/82  soft-tissue]
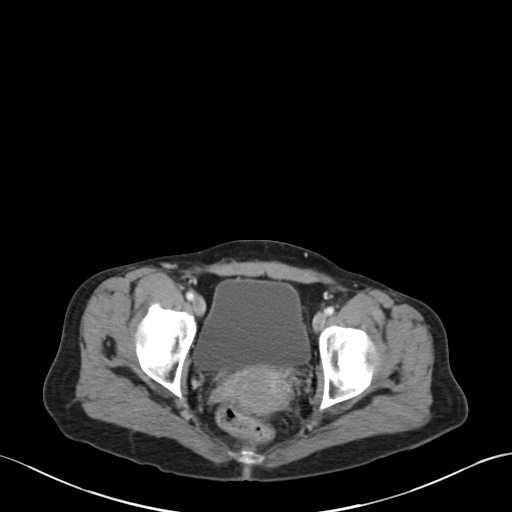
[im 22/82  soft-tissue]
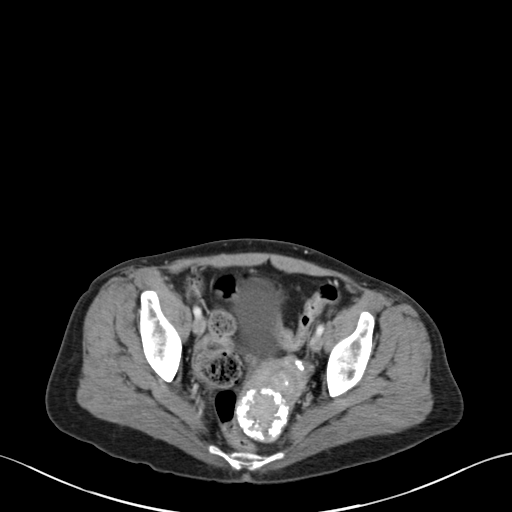
[im 30/82  soft-tissue]
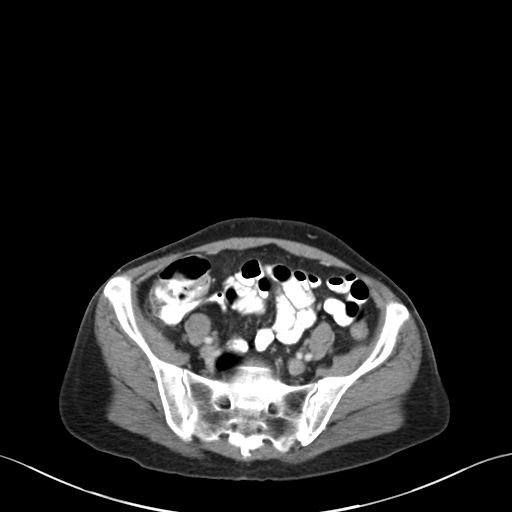
[im 35/82  soft-tissue]
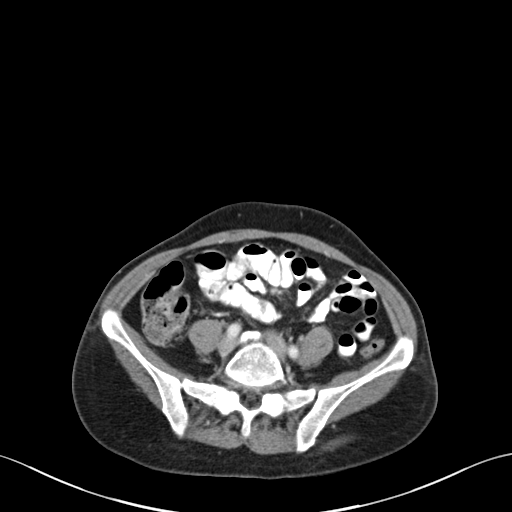
[im 43/82  soft-tissue]
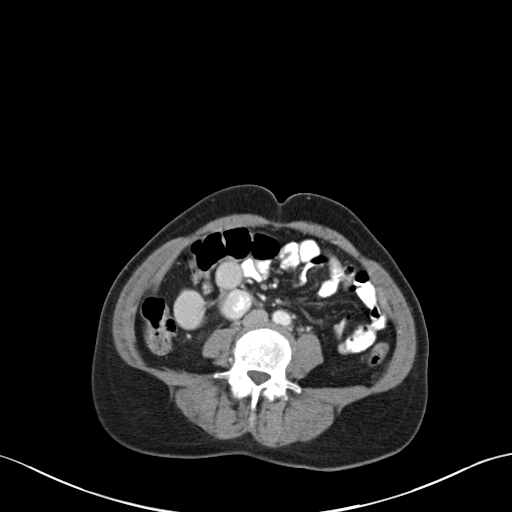
[im 47/82  soft-tissue]
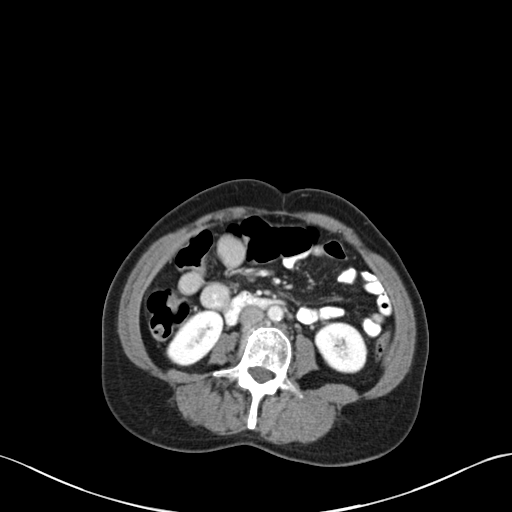
[im 52/82  soft-tissue]
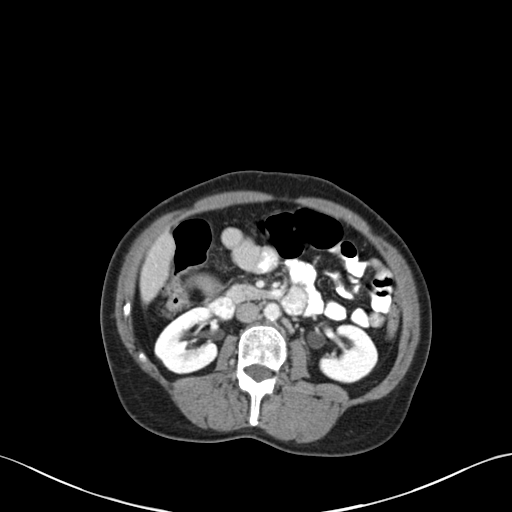
[im 52/82  bone]
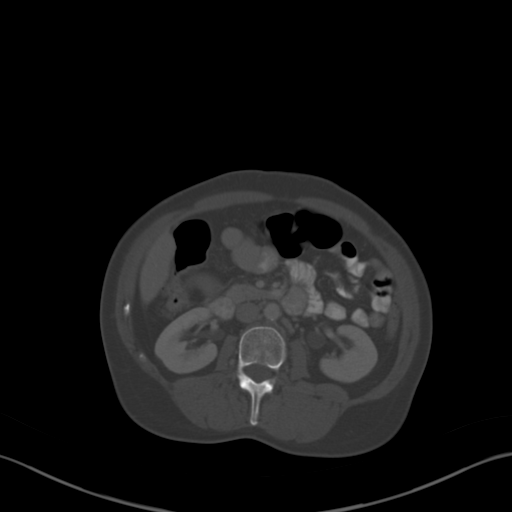
[im 60/82  soft-tissue]
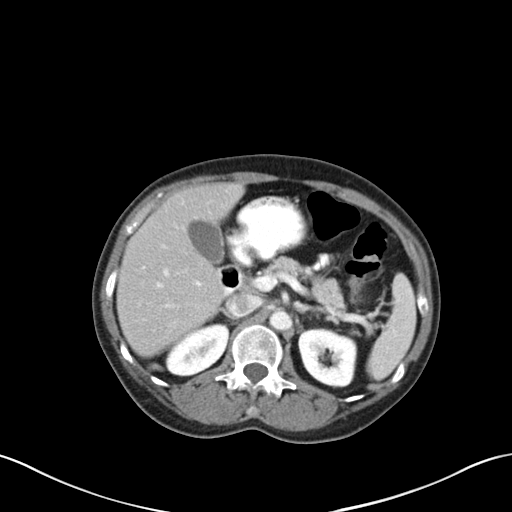
[im 64/82  soft-tissue]
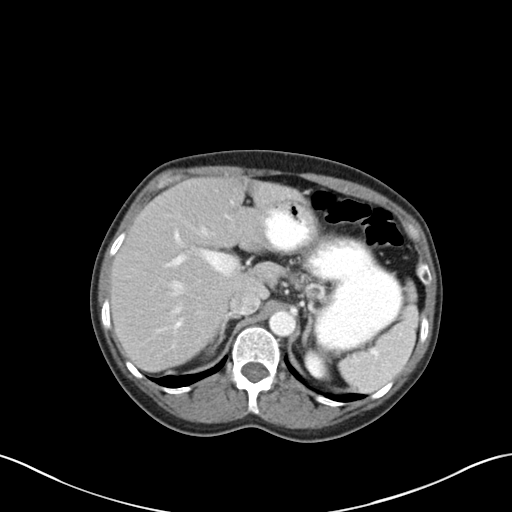
[im 64/82  lung]
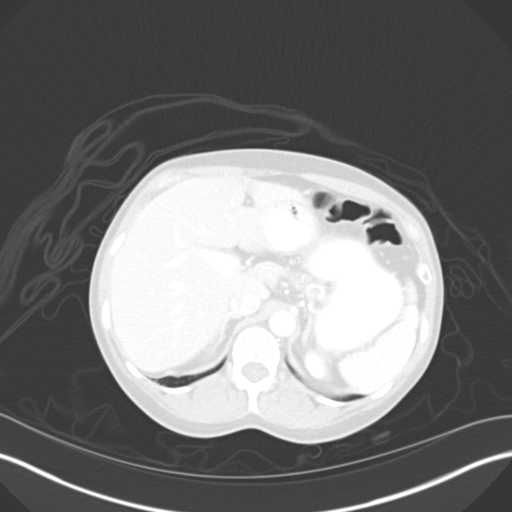
[im 69/82  soft-tissue]
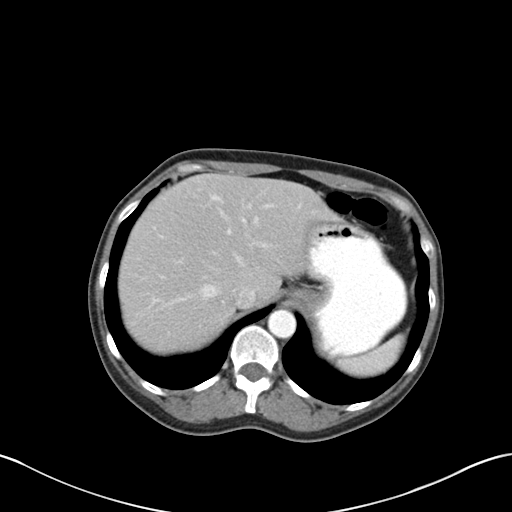
[im 69/82  lung]
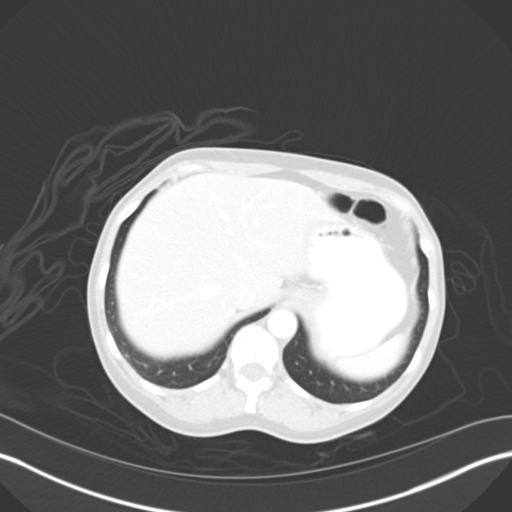
[im 73/82  lung]
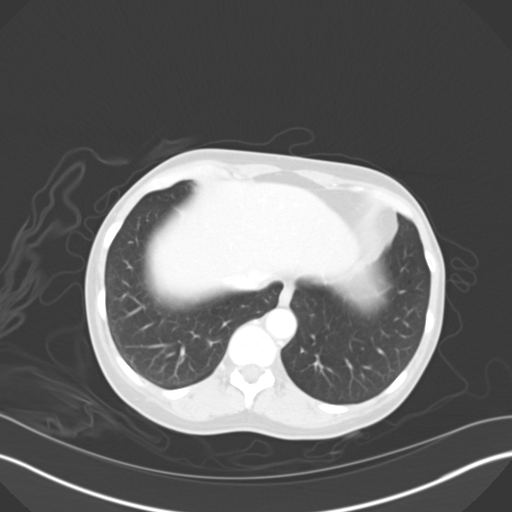
[im 77/82  soft-tissue]
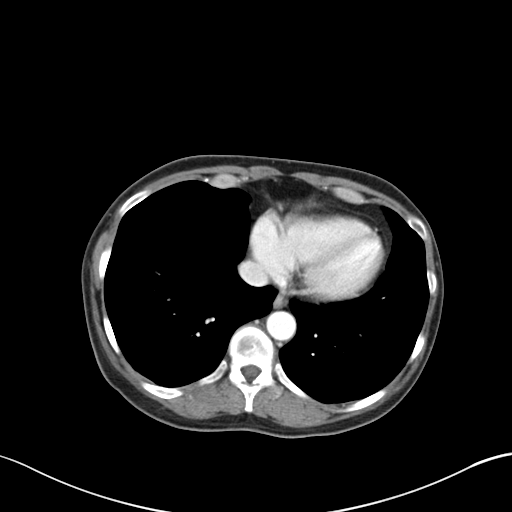
[im 77/82  lung]
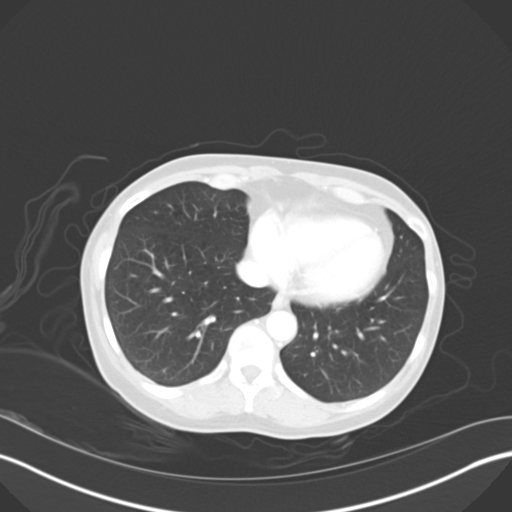

[14 of 32 positions shown; findings below may reference images not displayed]

FINDINGS: Lung bases are free of acute infiltrate or sizable effusion.

The liver, gallbladder, spleen, adrenal glands and pancreas are
within normal limits and unchanged from the prior exam. The kidneys
are well visualized and demonstrate a normal enhancement pattern. No
renal calculi or obstructive changes are noted. Excretion of
contrast is noted from both kidneys. The appendix is not well
visualized. No inflammatory changes are seen. Multiple calcified
uterine fibroids are seen. The bladder is well distended. No pelvic
mass lesion or sidewall abnormality is noted. The osseous structures
are within normal limits.
IMPRESSION: Chronic changes without acute abnormality.

## 2013-12-29 MED ORDER — PROMETHAZINE HCL 25 MG PO TABS: 25.0000 mg | ORAL_TABLET | Freq: Three times a day (TID) | ORAL | Status: DC

## 2013-12-29 MED ORDER — IOHEXOL 300 MG/ML  SOLN
80.0000 mL | Freq: Once | INTRAMUSCULAR | Status: AC | PRN
Start: 2013-12-29 — End: 2013-12-29
  Administered 2013-12-29: 80 mL via INTRAVENOUS

## 2013-12-29 MED ORDER — DIPHENHYDRAMINE HCL 50 MG/ML IJ SOLN
50.0000 mg | Freq: Once | INTRAMUSCULAR | Status: AC
  Administered 2013-12-29: 50 mg via INTRAVENOUS
  Filled 2013-12-29: qty 1

## 2013-12-29 MED ORDER — ONDANSETRON 8 MG PO TBDP: ORAL_TABLET | ORAL | Status: DC

## 2013-12-29 MED ORDER — IOHEXOL 300 MG/ML  SOLN
50.0000 mL | Freq: Once | INTRAMUSCULAR | Status: AC | PRN
  Administered 2013-12-29: 50 mL via ORAL

## 2013-12-29 MED ORDER — HYDROMORPHONE HCL PF 1 MG/ML IJ SOLN
1.0000 mg | Freq: Once | INTRAMUSCULAR | Status: AC
  Administered 2013-12-29: 1 mg via INTRAVENOUS
  Filled 2013-12-29: qty 1

## 2013-12-29 MED ORDER — METOCLOPRAMIDE HCL 5 MG/ML IJ SOLN
10.0000 mg | Freq: Once | INTRAMUSCULAR | Status: AC
  Administered 2013-12-29: 10 mg via INTRAVENOUS
  Filled 2013-12-29: qty 2

## 2013-12-29 MED ORDER — ONDANSETRON HCL 4 MG/2ML IJ SOLN
4.0000 mg | Freq: Once | INTRAMUSCULAR | Status: AC
Start: 2013-12-29 — End: 2013-12-29
  Administered 2013-12-29: 4 mg via INTRAVENOUS
  Filled 2013-12-29: qty 2

## 2013-12-29 MED ORDER — POTASSIUM CHLORIDE 10 MEQ/100ML IV SOLN
10.0000 meq | Freq: Once | INTRAVENOUS | Status: AC
  Administered 2013-12-29: 10 meq via INTRAVENOUS
  Filled 2013-12-29: qty 100

## 2013-12-29 MED ORDER — ONDANSETRON HCL 4 MG/2ML IJ SOLN
4.0000 mg | Freq: Once | INTRAMUSCULAR | Status: AC
  Administered 2013-12-29: 4 mg via INTRAVENOUS
  Filled 2013-12-29: qty 2

## 2013-12-29 MED ORDER — HYDROMORPHONE HCL PF 1 MG/ML IJ SOLN
0.5000 mg | Freq: Once | INTRAMUSCULAR | Status: AC
  Administered 2013-12-29: 0.5 mg via INTRAVENOUS
  Filled 2013-12-29: qty 1

## 2013-12-29 NOTE — ED Provider Notes (Signed)
CSN: [id number]     Arrival date & time 12/29/13  1717 History   First MD Initiated Contact with Patient 12/29/13 1808     Chief Complaint  Patient presents with  . Flank Pain     (Consider location/radiation/quality/duration/timing/severity/associated sxs/prior Treatment) HPI 58 year old female with chronic right-sided abdominal pain daily for years, 24 hours a day for years she has chronic severe right-sided abdominal pain worse in the right lower quadrant was daily nonbloody vomiting and diarrhea for years, she has had prior evaluations by her primary care doctor in GI, she presents with exacerbation of worse than usual severe pain to the right side of the abdomen was worse than usual daily multiple episodes of nonbloody vomiting and diarrhea for the last 2 weeks with no dysuria no chest pain no shortness of breath no rash no trauma no confusion and no treatment prior to arrival. Her pain is not colicky it is worse with palpation and position changes. Patient has chronic pain management contract. Past Medical History  Diagnosis Date  . Depression   . GERD (gastroesophageal reflux disease)   . History of nephrolithiasis   . History of UTI   . ADD (attention deficit disorder)   . Recreational drug use     History  . Tobacco abuse   . Asthma   . Anxiety disorder   . COPD (chronic obstructive pulmonary disease)   . Fibroid uterus   . Bulging lumbar disc   . DDD (degenerative disc disease), lumbar    Past Surgical History  Procedure Laterality Date  . Lithotripsy  1985  . Tonsillectomy    . Exploration middle ear      with revision of left stapedectomy and replacement of prosthesis  . Fetal surgery for congenital hernia    . Hernia repair    . Esophageal dilation     Family History  Problem Relation Age of Onset  . Coronary artery disease Mother   . Hypertension Mother   . Diabetes Father   . Colon cancer Maternal Grandfather    History  Substance Use Topics  . Smoking  status: Current Every Day Smoker    Types: Cigarettes  . Smokeless tobacco: Never Used     Comment: Patient given counseling sheet to quit smoking; now smoking :hookah with 1 cigarette daily....  . Alcohol Use: Yes     Comment: hx of heavy Etoh use in the past; no socially   OB History   Grav Para Term Preterm Abortions TAB SAB Ect Mult Living                 Review of Systems 10 Systems reviewed and are negative for acute change except as noted in the HPI.   Allergies  Adhesive; Cefuroxime axetil; and Codeine  Home Medications   Current Outpatient Rx  Name  Route  Sig  Dispense  Refill  . amphetamine-dextroamphetamine (ADDERALL) 30 MG tablet   Oral   Take 30 mg by mouth 2 (two) times daily.          . citalopram (CELEXA) 40 MG tablet   Oral   Take 40 mg by mouth daily.          . diazepam (VALIUM) 10 MG tablet   Oral   Take 10 mg by mouth every 8 (eight) hours as needed. anxiety         . HYDROcodone-acetaminophen (NORCO) 10-325 MG per tablet   Oral   Take 1 tablet by mouth 2 (two)  times daily as needed. pain         . LORazepam (ATIVAN) 1 MG tablet   Oral   Take 1 tablet by mouth daily as needed. anxiety         . tiZANidine (ZANAFLEX) 4 MG tablet   Oral   Take 4 mg by mouth at bedtime as needed for muscle spasms.         Marland Kitchen zolpidem (AMBIEN) 10 MG tablet   Oral   Take 10 mg by mouth at bedtime as needed for sleep.         Marland Kitchen albuterol (PROVENTIL HFA;VENTOLIN HFA) 108 (90 BASE) MCG/ACT inhaler   Inhalation   Inhale 1-2 puffs into the lungs every 6 (six) hours as needed for wheezing.   1 Inhaler   0   . ondansetron (ZOFRAN ODT) 8 MG disintegrating tablet      8mg  ODT q4 hours prn nausea   6 tablet   0   . promethazine (PHENERGAN) 25 MG tablet   Oral   Take 1 tablet (25 mg total) by mouth 3 (three) times daily.   10 tablet   0    BP 133/82  Pulse 61  Temp(Src) 98.5 F (36.9 C) (Oral)  Resp 18  SpO2 97% Physical Exam  Nursing  note and vitals reviewed. Constitutional:  Awake, alert, nontoxic appearance.  HENT:  Head: Atraumatic.  Eyes: Right eye exhibits no discharge. Left eye exhibits no discharge.  Neck: Neck supple.  Cardiovascular: Normal rate and regular rhythm.   No murmur heard. Pulmonary/Chest: Effort normal and breath sounds normal. No respiratory distress. She has no wheezes. She has no rales. She exhibits no tenderness.  Abdominal: Soft. Bowel sounds are normal. She exhibits no distension and no mass. There is tenderness. There is no rebound and no guarding.  Mildly tender right upper quadrant moderately tender right lower quadrants minimally tender entire left side of the abdomen no rebound  Musculoskeletal: She exhibits no tenderness.  Baseline ROM, no obvious new focal weakness.  Neurological: She is alert.  Mental status and motor strength appears baseline for patient and situation.  Skin: No rash noted.  Psychiatric: She has a normal mood and affect.    ED Course  Procedures (including critical care time) Pt stable in ED with no significant deterioration in condition.Patient informed of clinical course, understand medical decision-making process, and agree with plan. Labs Review Labs Reviewed  CBC WITH DIFFERENTIAL - Abnormal; Notable for the following:    Hemoglobin 15.3 (*)    All other components within normal limits  COMPREHENSIVE METABOLIC PANEL - Abnormal; Notable for the following:    Potassium 3.0 (*)    Glucose, Bld 125 (*)    BUN 4 (*)    All other components within normal limits  URINALYSIS, ROUTINE W REFLEX MICROSCOPIC - Abnormal; Notable for the following:    Color, Urine AMBER (*)    APPearance CLOUDY (*)    Hgb urine dipstick SMALL (*)    Bilirubin Urine SMALL (*)    Protein, ur 30 (*)    Leukocytes, UA TRACE (*)    All other components within normal limits  URINE MICROSCOPIC-ADD ON - Abnormal; Notable for the following:    Crystals CA OXALATE CRYSTALS (*)    All  other components within normal limits  LIPASE, BLOOD   Imaging Review Ct Abdomen Pelvis W Contrast  12/29/2013   CLINICAL DATA:  Back pain and flank pain  EXAM: CT ABDOMEN AND PELVIS  WITH CONTRAST  TECHNIQUE: Multidetector CT imaging of the abdomen and pelvis was performed using the standard protocol following bolus administration of intravenous contrast.  CONTRAST:  12mL OMNIPAQUE IOHEXOL 300 MG/ML SOLN, 43mL OMNIPAQUE IOHEXOL 300 MG/ML SOLN  COMPARISON:  09/21/2012  FINDINGS: Lung bases are free of acute infiltrate or sizable effusion.  The liver, gallbladder, spleen, adrenal glands and pancreas are within normal limits and unchanged from the prior exam. The kidneys are well visualized and demonstrate a normal enhancement pattern. No renal calculi or obstructive changes are noted. Excretion of contrast is noted from both kidneys. The appendix is not well visualized. No inflammatory changes are seen. Multiple calcified uterine fibroids are seen. The bladder is well distended. No pelvic mass lesion or sidewall abnormality is noted. The osseous structures are within normal limits.  IMPRESSION: Chronic changes without acute abnormality.   Electronically Signed   By: Inez Catalina M.D.   On: 12/29/2013 20:57     EKG Interpretation None      MDM   Final diagnoses:  Chronic abdominal pain  Vomiting and diarrhea    I doubt any other EMC precluding discharge at this time including, but not necessarily limited to the following:SBI, peritonitis.    Babette Relic, MD 12/31/13 1341

## 2013-12-29 NOTE — Discharge Instructions (Signed)

## 2013-12-29 NOTE — ED Notes (Signed)
Pt states potassium IV was burning so I adjusted with ns 3:1,  States it is better now

## 2013-12-29 NOTE — ED Notes (Addendum)
Pt reports lower back pain and right flank pain that started three weeks ago. Pt reports a history of kidney stones. Pt reports nausea, emesis, and diarrhea. Pt reports dysuria, frequent urination, however denies hematuria. Pt is A/O x4 and in NAD.

## 2014-01-22 ENCOUNTER — Telehealth: Payer: Self-pay | Admitting: Gastroenterology

## 2014-01-22 NOTE — Telephone Encounter (Signed)
Patient reports that she is having abdominal pain and diarrhea.  This has been going on for several months.  She was seen in the ER on 12/29/13, but then followed up with Dr. Brigitte Pulse not GI.  She is scheduled for 01/29/14.  She is aware that Dr. Fuller Plan is not here until 01/29/14 when she has her appt with Dr. Fuller Plan.  She alternates between diarrhea and constipation, she also has periods of incontinence.  She is advised that she should keep the appt for Monday when he returns.

## 2014-01-29 ENCOUNTER — Encounter: Payer: Self-pay | Admitting: Gastroenterology

## 2014-01-29 ENCOUNTER — Ambulatory Visit (INDEPENDENT_AMBULATORY_CARE_PROVIDER_SITE_OTHER): Payer: Medicare Other | Admitting: Gastroenterology

## 2014-01-29 VITALS — BP 90/60 | HR 96 | Ht 58.25 in | Wt 100.4 lb

## 2014-01-29 DIAGNOSIS — K219 Gastro-esophageal reflux disease without esophagitis: Secondary | ICD-10-CM

## 2014-01-29 DIAGNOSIS — R109 Unspecified abdominal pain: Secondary | ICD-10-CM

## 2014-01-29 DIAGNOSIS — K589 Irritable bowel syndrome without diarrhea: Secondary | ICD-10-CM

## 2014-01-29 MED ORDER — OMEPRAZOLE 40 MG PO CPDR: 40.0000 mg | DELAYED_RELEASE_CAPSULE | Freq: Every day | ORAL | Status: DC

## 2014-01-29 MED ORDER — DICYCLOMINE HCL 10 MG PO CAPS: 10.0000 mg | ORAL_CAPSULE | Freq: Four times a day (QID) | ORAL | Status: DC | PRN

## 2014-01-29 NOTE — Progress Notes (Signed)
    History of Present Illness: This is a 58 year old female with a history of pyloric stenosis. She underwent upper endoscopy with pyloric balloon dilatation in August 2012. She underwent colonoscopy in January 2012 showing a hyperplastic polyp and diverticulosis. An upper GI series in December 2013 showed moderate GERD, tertiary esophageal contractions, an elongated pyloric channel but no obstruction. She seen in the emergency department on 12/29/2013 4 chronic right-sided abdominal pain, right lower quadrant pain, and back pain associated with N/V/D. She underwent CT scan of the abdomen/pelvis on 12/29/2013 which was negative. Blood work unremarkable except for potassium of 3.0 and glucose of 125. UA was abnormal. Is only taking her PPI intermittently. She relates alternating diarrhea and constipation.  Current Medications, Allergies, Past Medical History, Past Surgical History, Family History and Social History were reviewed in [employer] record.  Physical Exam: General: Well developed , well nourished, no acute distress Head: Normocephalic and atraumatic Eyes:  sclerae anicteric, EOMI Ears: Normal auditory acuity Mouth: No deformity or lesions Lungs: Clear throughout to auscultation Heart: Regular rate and rhythm; no murmurs, rubs or bruits Abdomen: Soft, mild right-sided abdominal tenderness and non distended. No masses, hepatosplenomegaly or hernias noted. Normal Bowel sounds Musculoskeletal: Symmetrical with no gross deformities  Pulses:  Normal pulses noted Extremities: No clubbing, cyanosis, edema or deformities noted Neurological: Alert oriented x 4, grossly nonfocal Psychological:  Alert and cooperative. Normal mood and affect  Assessment and Recommendations:  1. GERD. Standard antireflux measures PPI daily long-term.  2. History of pyloric stenosis, symptoms resolved following pyloric dilation in 2012. PPI daily long-term.  3. Irritable bowel syndrome.  Dicyclomine 4 times a day as needed.    4. Chronic right-sided abdominal pain, etiology unclear. Musculoskeletal etiology is suspected.

## 2014-01-29 NOTE — Patient Instructions (Addendum)
You have been given a separate informational sheet regarding your tobacco use, the importance of quitting and local resources to help you quit.  We have sent the following medications to your pharmacy for you to pick up at your convenience: Omeprazole and Bentyl.  Patient advised to avoid spicy, acidic, citrus, chocolate, mints, fruit and fruit juices.  Limit the intake of caffeine, alcohol and Soda.  Don't exercise too soon after eating.  Don't lie down within 3-4 hours of eating.  Elevate the head of your bed.  Thank you for choosing me and [facility].  Pricilla Riffle. Dagoberto Ligas., MD., Marval Regal  cc: Reynold Bowen, MD

## 2014-05-28 ENCOUNTER — Encounter: Payer: Self-pay | Admitting: Gastroenterology

## 2015-02-26 ENCOUNTER — Other Ambulatory Visit: Payer: Self-pay

## 2015-02-26 MED ORDER — OMEPRAZOLE 40 MG PO CPDR: 40.0000 mg | DELAYED_RELEASE_CAPSULE | Freq: Every day | ORAL | Status: DC

## 2015-04-03 ENCOUNTER — Other Ambulatory Visit: Payer: Self-pay

## 2015-04-03 MED ORDER — DICYCLOMINE HCL 10 MG PO CAPS: 10.0000 mg | ORAL_CAPSULE | Freq: Four times a day (QID) | ORAL | Status: DC | PRN

## 2016-03-16 ENCOUNTER — Emergency Department (HOSPITAL_COMMUNITY)
Admission: EM | Admit: 2016-03-16 | Discharge: 2016-03-17 | Disposition: A | Discharge status: Home / Self Care | Payer: Medicare Other | Attending: Emergency Medicine | Admitting: Emergency Medicine

## 2016-03-16 ENCOUNTER — Encounter (HOSPITAL_COMMUNITY): Payer: Self-pay | Admitting: Emergency Medicine

## 2016-03-16 DIAGNOSIS — R509 Fever, unspecified: Secondary | ICD-10-CM | POA: Diagnosis not present

## 2016-03-16 DIAGNOSIS — J449 Chronic obstructive pulmonary disease, unspecified: Secondary | ICD-10-CM | POA: Insufficient documentation

## 2016-03-16 DIAGNOSIS — F17210 Nicotine dependence, cigarettes, uncomplicated: Secondary | ICD-10-CM | POA: Diagnosis not present

## 2016-03-16 DIAGNOSIS — N390 Urinary tract infection, site not specified: Secondary | ICD-10-CM | POA: Insufficient documentation

## 2016-03-16 DIAGNOSIS — Z79899 Other long term (current) drug therapy: Secondary | ICD-10-CM | POA: Diagnosis not present

## 2016-03-16 DIAGNOSIS — K529 Noninfective gastroenteritis and colitis, unspecified: Secondary | ICD-10-CM | POA: Insufficient documentation

## 2016-03-16 DIAGNOSIS — R112 Nausea with vomiting, unspecified: Secondary | ICD-10-CM | POA: Diagnosis present

## 2016-03-16 DIAGNOSIS — E876 Hypokalemia: Secondary | ICD-10-CM | POA: Insufficient documentation

## 2016-03-16 DIAGNOSIS — F909 Attention-deficit hyperactivity disorder, unspecified type: Secondary | ICD-10-CM | POA: Insufficient documentation

## 2016-03-16 DIAGNOSIS — R110 Nausea: Secondary | ICD-10-CM

## 2016-03-16 LAB — COMPREHENSIVE METABOLIC PANEL
ALT: 17 U/L (ref 14–54)
AST: 26 U/L (ref 15–41)
Albumin: 4.2 g/dL (ref 3.5–5.0)
Alkaline Phosphatase: 94 U/L (ref 38–126)
Anion gap: 10 (ref 5–15)
BUN: 7 mg/dL (ref 6–20)
CHLORIDE: 99 mmol/L — AB (ref 101–111)
CO2: 28 mmol/L (ref 22–32)
CREATININE: 0.72 mg/dL (ref 0.44–1.00)
Calcium: 9.9 mg/dL (ref 8.9–10.3)
GFR calc non Af Amer: >60 mL/min (ref >60)
Glucose, Bld: 128 mg/dL — ABNORMAL HIGH (ref 65–99)
Potassium: 2.8 mmol/L — ABNORMAL LOW (ref 3.5–5.1)
Sodium: 137 mmol/L (ref 135–145)
Total Bilirubin: 0.7 mg/dL (ref 0.3–1.2)
Total Protein: 7.6 g/dL (ref 6.5–8.1)

## 2016-03-16 LAB — LIPASE, BLOOD: Lipase: 24 U/L (ref 11–51)

## 2016-03-16 LAB — URINALYSIS, ROUTINE W REFLEX MICROSCOPIC
Bilirubin Urine: NEGATIVE
GLUCOSE, UA: NEGATIVE mg/dL
Ketones, ur: NEGATIVE mg/dL
Nitrite: NEGATIVE
PH: 8 (ref 5.0–8.0)
Protein, ur: NEGATIVE mg/dL
SPECIFIC GRAVITY, URINE: 1.01 (ref 1.005–1.030)

## 2016-03-16 LAB — CBC
HCT: 45.6 % (ref 36.0–46.0)
HEMOGLOBIN: 16.1 g/dL — AB (ref 12.0–15.0)
MCH: 31.1 pg (ref 26.0–34.0)
MCHC: 35.3 g/dL (ref 30.0–36.0)
MCV: 88.2 fL (ref 78.0–100.0)
PLATELETS: 275 10*3/uL (ref 150–400)
RBC: 5.17 MIL/uL — AB (ref 3.87–5.11)
RDW: 12.8 % (ref 11.5–15.5)
WBC: 10.3 10*3/uL (ref 4.0–10.5)

## 2016-03-16 LAB — URINE MICROSCOPIC-ADD ON

## 2016-03-16 MED ORDER — SODIUM CHLORIDE 0.9 % IV SOLN
Freq: Once | INTRAVENOUS | Status: AC
  Administered 2016-03-16: 23:00:00 via INTRAVENOUS

## 2016-03-16 MED ORDER — SULFAMETHOXAZOLE-TRIMETHOPRIM 800-160 MG PO TABS
1.0000 | ORAL_TABLET | Freq: Once | ORAL | Status: AC
  Administered 2016-03-16: 1 via ORAL
  Filled 2016-03-16: qty 1

## 2016-03-16 MED ORDER — POTASSIUM CHLORIDE CRYS ER 20 MEQ PO TBCR
20.0000 meq | EXTENDED_RELEASE_TABLET | Freq: Once | ORAL | Status: AC
  Administered 2016-03-16: 20 meq via ORAL
  Filled 2016-03-16: qty 1

## 2016-03-16 MED ORDER — POTASSIUM CHLORIDE 10 MEQ/100ML IV SOLN
10.0000 meq | INTRAVENOUS | Status: AC
  Administered 2016-03-16 – 2016-03-17 (×2): 10 meq via INTRAVENOUS
  Filled 2016-03-16 (×2): qty 100

## 2016-03-16 MED ORDER — ONDANSETRON 4 MG PO TBDP
4.0000 mg | ORAL_TABLET | Freq: Once | ORAL | Status: AC
  Administered 2016-03-16: 4 mg via ORAL
  Filled 2016-03-16: qty 1

## 2016-03-16 NOTE — ED Provider Notes (Signed)
CSN: [id number]     Arrival date & time 03/16/16  1754 History   By signing my name below, I, Melanie Caldwell, attest that this documentation has been prepared under the direction and in the presence of Melanie Creamer, NP.  Electronically Signed: Eustaquio Caldwell, ED Scribe. 03/16/2016. 11:05 PM.  Chief Complaint  Patient presents with  . Emesis  . Fever  . Medication Refill   The history is provided by the patient. No language interpreter was used.    HPI Comments: Melanie Caldwell is a 60 y.o. female with PMHx IBS with diarrhea, depression, and anxiety who presents to the Emergency Department complaining of night sweats, subjective fever, and cough x two weeks. Pt reports associated nausea and vomiting 3 weeks ago which recently subsided ~1 week ago. She mentions having right flank pain that radiates into her RLQ for the past 3 weeks. Pt has hx of kidney stones and states this feels similar. She took Dilaudid which is prescribed for chronic back pain without relief. She also has associated loss of appetite but is able to tolerate PO. Pt denies taking any medication for her symptoms. She says that her bowel movements are soft form which is baseline. Pt says that she talked to her PCP earlier today about not taking anti-depressants before presenting to the ED. She states that her depression has worsened since her mother passed. Denies any other associated symptoms.   Past Medical History  Diagnosis Date  . Depression   . GERD (gastroesophageal reflux disease)   . History of nephrolithiasis   . History of UTI   . ADD (attention deficit disorder)   . Recreational drug use     History  . Tobacco abuse   . Asthma   . Anxiety disorder   . COPD (chronic obstructive pulmonary disease) ([place])   . Fibroid uterus   . Bulging lumbar disc   . DDD (degenerative disc disease), lumbar   . Pyloric stenosis    Past Surgical History  Procedure Laterality Date  . Lithotripsy  1985  . Tonsillectomy    .  Exploration middle ear      with revision of left stapedectomy and replacement of prosthesis  . Fetal surgery for congenital hernia    . Hernia repair    . Esophageal dilation     Family History  Problem Relation Age of Onset  . Coronary artery disease Mother   . Hypertension Mother   . Diabetes Father   . Colon cancer Maternal Grandfather    Social History  Substance Use Topics  . Smoking status: Current Every Day Smoker    Types: Cigarettes  . Smokeless tobacco: Never Used     Comment: Patient given counseling sheet to quit smoking; now smoking :hookah with 1 cigarette daily....  . Alcohol Use: Yes     Comment: hx of heavy Etoh use in the past; now socially   OB History    No data available     Review of Systems  Constitutional: Positive for fever (subjective) and diaphoresis (night sweats).  Gastrointestinal: Positive for nausea, vomiting, abdominal pain and diarrhea.  Musculoskeletal: Positive for back pain (right flank) and arthralgias.  All other systems reviewed and are negative.  Allergies  Adhesive; Ceftin; Cefuroxime axetil; and Codeine  Home Medications   Prior to Admission medications   Medication Sig Start Date End Date Taking? Authorizing Provider  atorvastatin (LIPITOR) 40 MG tablet Take 1 tablet by mouth daily. 03/14/16  Yes Historical Provider, MD  citalopram (CELEXA) 40 MG tablet Take 40 mg by mouth daily.    Yes Historical Provider, MD  diazepam (VALIUM) 10 MG tablet Take 10 mg by mouth every 8 (eight) hours as needed. anxiety   Yes Historical Provider, MD  HYDROmorphone (DILAUDID) 4 MG tablet Take 1 tablet by mouth 2 (two) times daily. 02/26/16  Yes Historical Provider, MD  LORazepam (ATIVAN) 1 MG tablet Take 1 tablet by mouth daily as needed. anxiety 12/29/13  Yes Historical Provider, MD  albuterol (PROVENTIL HFA;VENTOLIN HFA) 108 (90 BASE) MCG/ACT inhaler Inhale 1-2 puffs into the lungs every 6 (six) hours as needed for wheezing. 12/29/12   Varney Biles,  MD  amphetamine-dextroamphetamine (ADDERALL) 30 MG tablet Take 30 mg by mouth 2 (two) times daily. Reported on 03/16/2016    Historical Provider, MD  dicyclomine (BENTYL) 10 MG capsule Take 1 capsule (10 mg total) by mouth 4 (four) times daily as needed for spasms. Patient not taking: Reported on 03/16/2016 04/03/15   Ladene Artist, MD  omeprazole (PRILOSEC) 40 MG capsule Take 1 capsule (40 mg total) by mouth daily. 02/26/15   Ladene Artist, MD  ondansetron (ZOFRAN-ODT) 4 MG disintegrating tablet Take 1 tablet (4 mg total) by mouth once. 03/17/16   Melanie Creamer, NP  potassium chloride (K-DUR) 10 MEQ tablet Take 1 tablet (10 mEq total) by mouth daily. 03/17/16   Melanie Creamer, NP  sulfamethoxazole-trimethoprim (BACTRIM DS,SEPTRA DS) 800-160 MG tablet Take 1 tablet by mouth 2 (two) times daily. 03/17/16   Melanie Creamer, NP   BP 121/81 mmHg  Pulse 79  Temp(Src) 99.2 F (37.3 C) (Oral)  Resp 18  Ht 4\' 11"  (1.499 m)  Wt 45.36 kg  BMI 20.19 kg/m2  SpO2 100%   Physical Exam  Constitutional: She is oriented to person, place, and time. She appears well-developed and well-nourished.  HENT:  Head: Normocephalic.  Eyes: Pupils are equal, round, and reactive to light.  Neck: Normal range of motion.  Cardiovascular: Normal rate and regular rhythm.   Pulmonary/Chest: Effort normal and breath sounds normal.  Abdominal: Soft. Bowel sounds are normal. She exhibits no distension. There is no tenderness.  Musculoskeletal: Normal range of motion.  Neurological: She is alert and oriented to person, place, and time.  Skin: Skin is warm and dry.  Nursing note and vitals reviewed.   ED Course  Procedures (including critical care time)  DIAGNOSTIC STUDIES: Oxygen Saturation is 97% on RA, normal by my interpretation.    COORDINATION OF CARE: 10:39 PM-Discussed treatment plan which includes Potassium chloride with pt at bedside and pt agreed to plan.    Labs Review Labs Reviewed  COMPREHENSIVE METABOLIC PANEL  - Abnormal; Notable for the following:    Potassium 2.8 (*)    Chloride 99 (*)    Glucose, Bld 128 (*)    All other components within normal limits  CBC - Abnormal; Notable for the following:    RBC 5.17 (*)    Hemoglobin 16.1 (*)    All other components within normal limits  URINALYSIS, ROUTINE W REFLEX MICROSCOPIC (NOT AT [facility]) - Abnormal; Notable for the following:    APPearance CLOUDY (*)    Hgb urine dipstick TRACE (*)    Leukocytes, UA LARGE (*)    All other components within normal limits  URINE MICROSCOPIC-ADD ON - Abnormal; Notable for the following:    Squamous Epithelial / LPF 0-5 (*)    Bacteria, UA FEW (*)    All other components within normal  limits  URINE CULTURE  LIPASE, BLOOD    Imaging Review Dg Abd Acute W/chest  03/17/2016  CLINICAL DATA:  Subacute onset of diarrhea and nausea. Initial encounter. EXAM: DG ABDOMEN ACUTE W/ 1V CHEST COMPARISON:  CT of the abdomen pelvis performed 12/29/2013, and chest radiograph performed 12/29/2012 FINDINGS: The lungs are well-aerated and clear. There is no evidence of focal opacification, pleural effusion or pneumothorax. The cardiomediastinal silhouette is within normal limits. The visualized bowel gas pattern is unremarkable. Scattered stool and air are seen within the colon; there is no evidence of small bowel dilatation to suggest obstruction. No free intra-abdominal air is identified on the provided upright view. No acute osseous abnormalities are seen; the sacroiliac joints are unremarkable in appearance. A calcified uterine fibroid is again noted. IMPRESSION: 1. Unremarkable bowel gas pattern; no free intra-abdominal air seen. Small amount of stool noted in the colon. 2. No acute cardiopulmonary process seen. Electronically Signed   By: Garald Balding M.D.   On: 03/17/2016 05:08   I have personally reviewed and evaluated these lab results as part of my medical decision-making.   EKG Interpretation   Date/Time:  Tuesday Mar 17 2016 03:21:22 EDT Ventricular Rate:  82 PR Interval:  133 QRS Duration: 90 QT Interval:  513 QTC Calculation: 599 R Axis:   -90 Text Interpretation:  Sinus rhythm Abnormal R-wave progression, late  transition Inferior infarct, old Prolonged QT interval Confirmed by Glynn Octave [phone]) on 03/17/2016 4:44:04 AM      MDM  Patient's potassium of 2.8.  Has been supplemented with IV and by mouth he also been treated for her urinary tract infection with Septra.  She's been given Zofran for nausea.  She is now able to tolerate fluids.  She'll be discharged home with prescriptions for Septra and Zofran instructions to follow-up with her primary care physician in 7-10 days Final diagnoses:  Chronic diarrhea  Nausea  Hypokalemia  UTI (lower urinary tract infection)    I personally performed the services described in this documentation, which was scribed in my presence. The recorded information has been reviewed and is accurate.    Melanie Creamer, NP 03/17/16 0600  Everlene Balls, MD 03/17/16 [id number]

## 2016-03-16 NOTE — ED Notes (Signed)
Patient reports nausea, vomiting, diarrhea, fever  x2 weeks. Patient also reports that she stopped taking her medication for depression for about 1 month.

## 2016-03-17 ENCOUNTER — Emergency Department (HOSPITAL_COMMUNITY): Payer: Medicare Other

## 2016-03-17 IMAGING — CR DG ABDOMEN ACUTE W/ 1V CHEST
3 series · 3 of 3 positions shown · non-contrast
Comparison: CT of the abdomen pelvis performed [DATE], and
chest radiograph performed [DATE]

CLINICAL DATA: Subacute onset of diarrhea and nausea. Initial
encounter.

EXAM:
DG ABDOMEN ACUTE W/ 1V CHEST

[w chest pa]
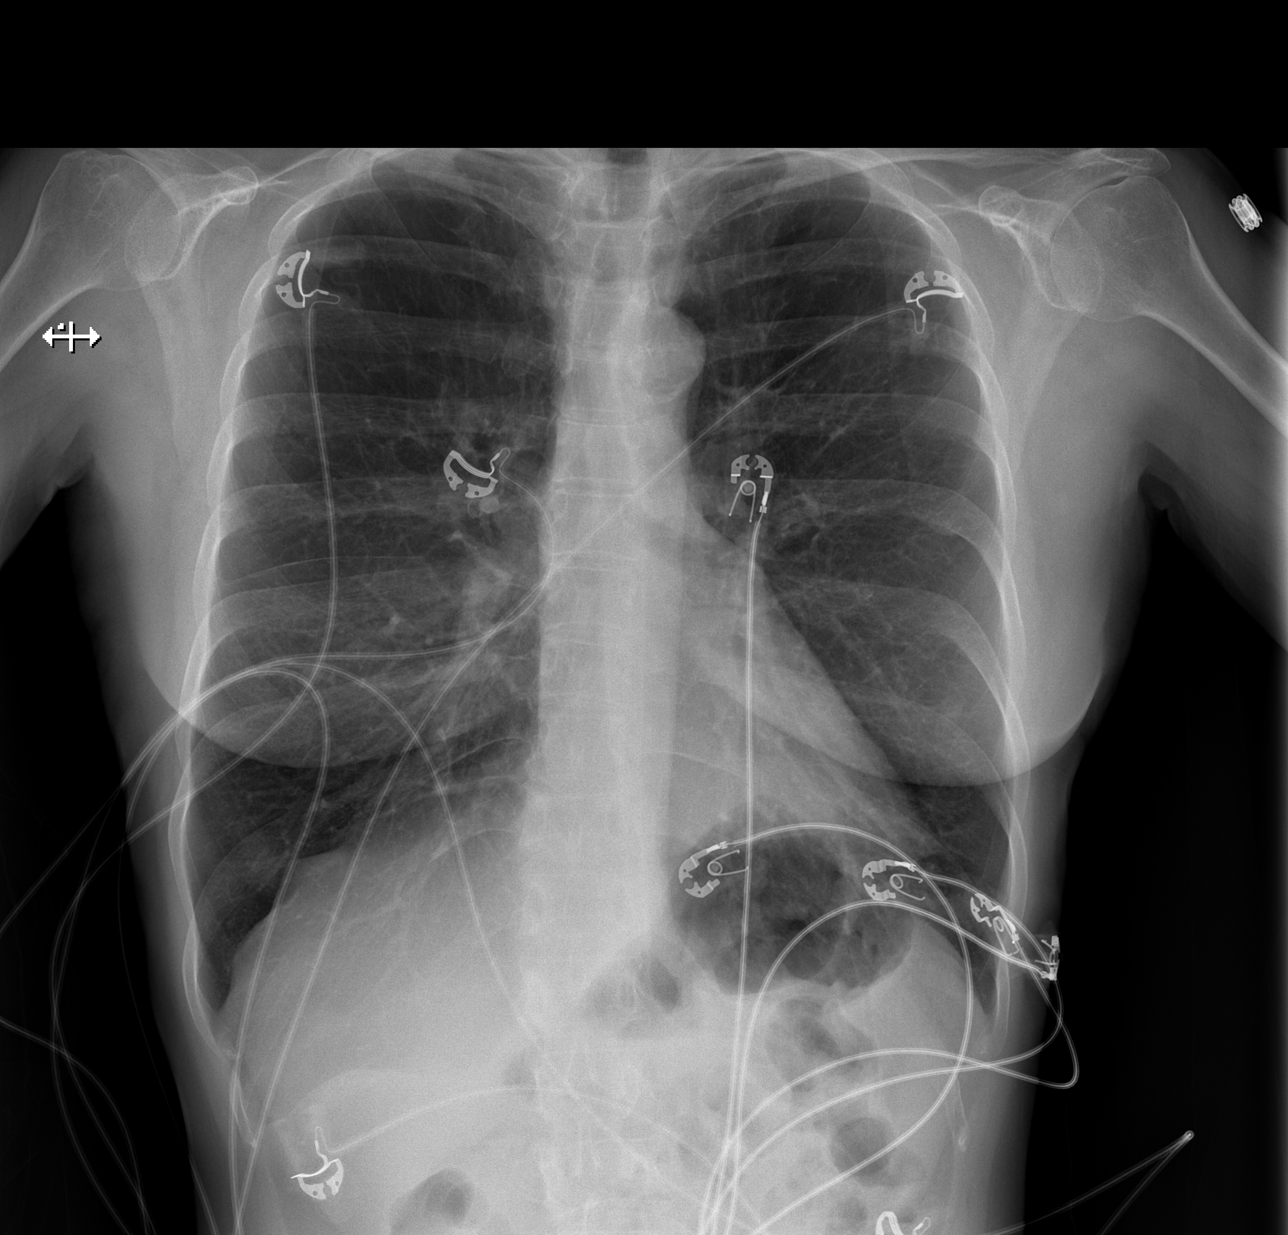

[w abdomen upright]
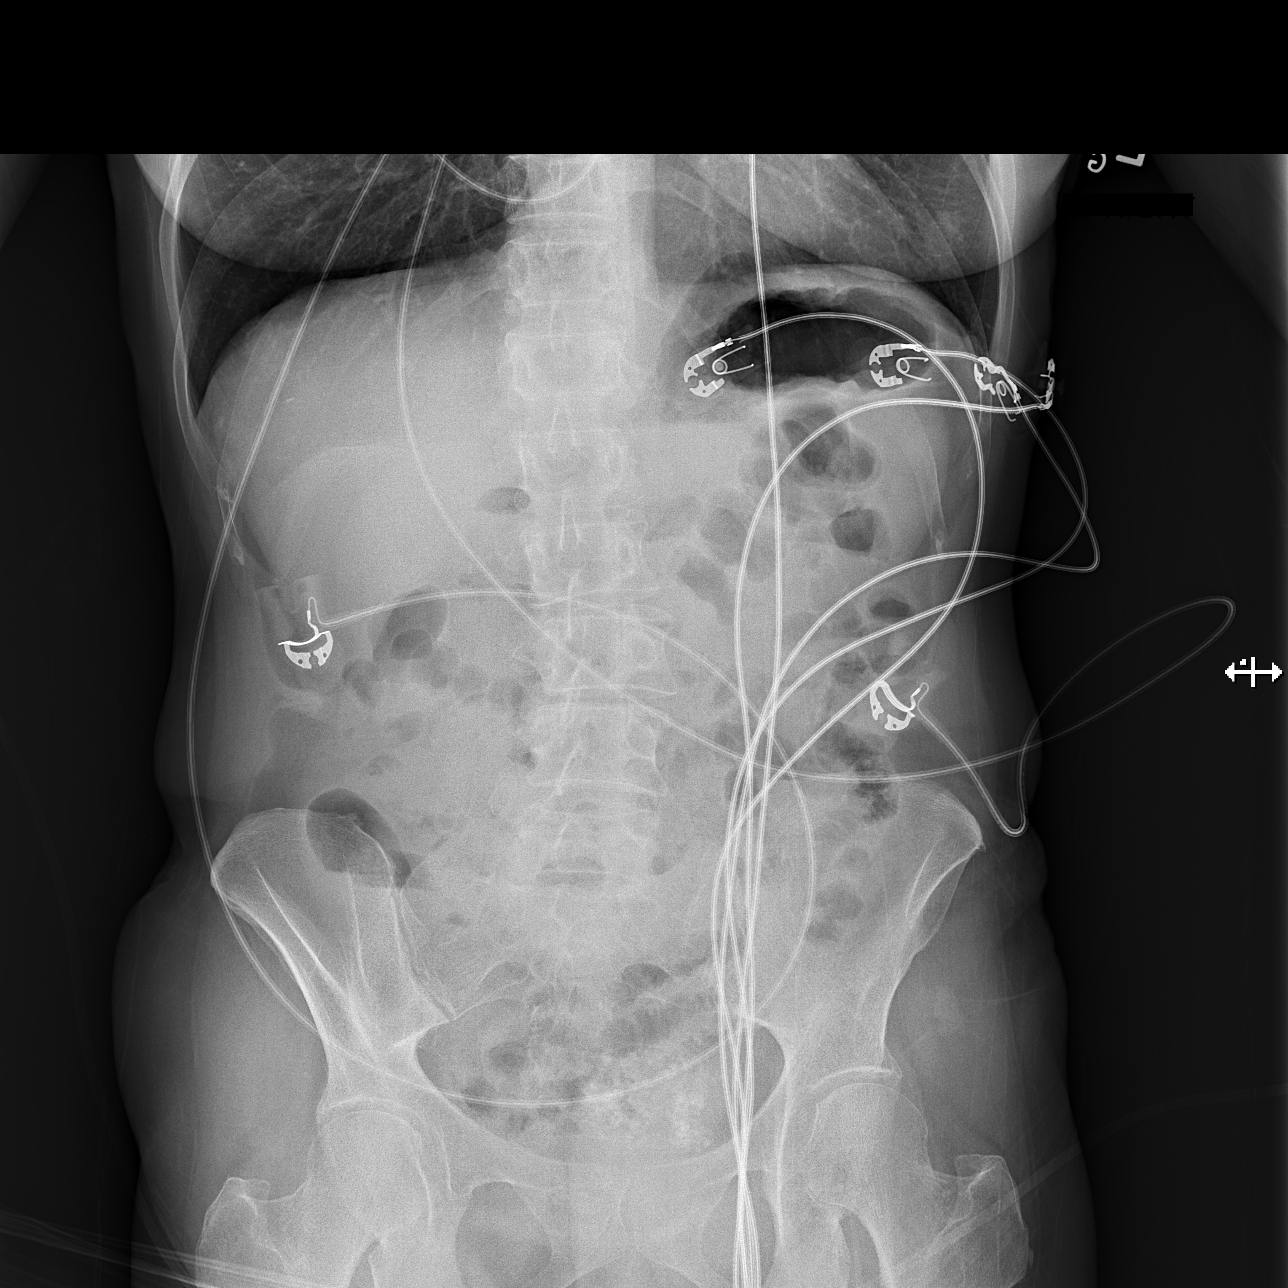

[t abdomen supine]
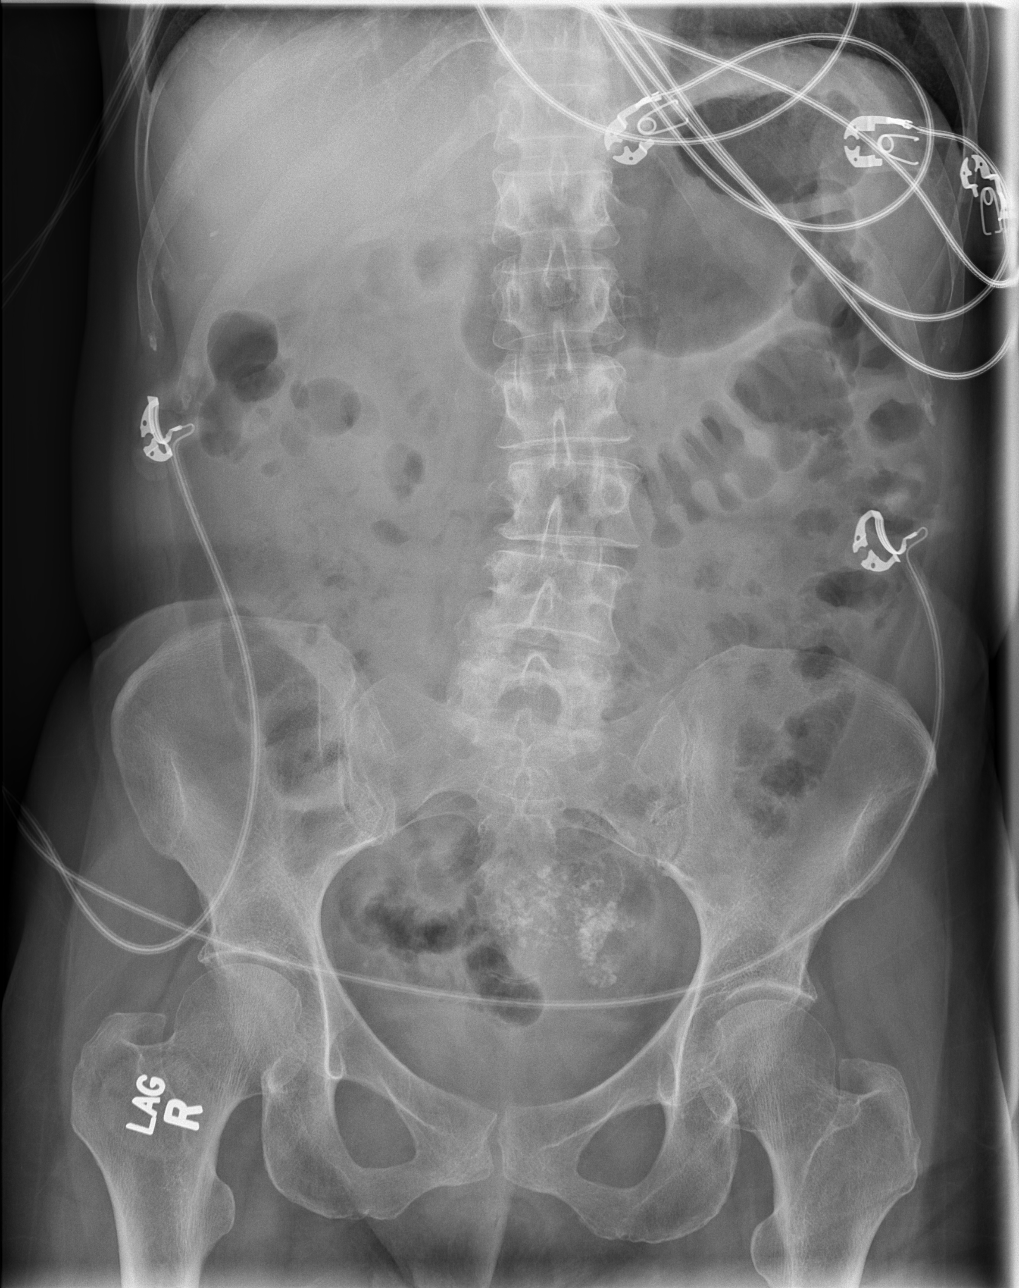

[3 of 3 positions shown; findings below may reference images not displayed]

FINDINGS: The lungs are well-aerated and clear. There is no evidence of focal
opacification, pleural effusion or pneumothorax. The
cardiomediastinal silhouette is within normal limits.

The visualized bowel gas pattern is unremarkable. Scattered stool
and air are seen within the colon; there is no evidence of small
bowel dilatation to suggest obstruction. No free intra-abdominal air
is identified on the provided upright view.

No acute osseous abnormalities are seen; the sacroiliac joints are
unremarkable in appearance. A calcified uterine fibroid is again
noted.
IMPRESSION: 1. Unremarkable bowel gas pattern; no free intra-abdominal air seen.
Small amount of stool noted in the colon.
2. No acute cardiopulmonary process seen.

## 2016-03-17 MED ORDER — ONDANSETRON 4 MG PO TBDP
4.0000 mg | ORAL_TABLET | Freq: Once | ORAL | Status: AC
  Administered 2016-03-17: 4 mg via ORAL
  Filled 2016-03-17: qty 1

## 2016-03-17 MED ORDER — ONDANSETRON 4 MG PO TBDP
4.0000 mg | ORAL_TABLET | Freq: Once | ORAL | Status: DC
Start: 2016-03-17 — End: 2016-08-14

## 2016-03-17 MED ORDER — DIPHENHYDRAMINE HCL 50 MG/ML IJ SOLN
12.5000 mg | Freq: Once | INTRAMUSCULAR | Status: AC
  Administered 2016-03-17: 12.5 mg via INTRAVENOUS
  Filled 2016-03-17: qty 1

## 2016-03-17 MED ORDER — SULFAMETHOXAZOLE-TRIMETHOPRIM 800-160 MG PO TABS: 1.0000 | ORAL_TABLET | Freq: Two times a day (BID) | ORAL | Status: DC

## 2016-03-17 MED ORDER — METOCLOPRAMIDE HCL 5 MG/ML IJ SOLN
10.0000 mg | Freq: Once | INTRAMUSCULAR | Status: AC
  Administered 2016-03-17: 10 mg via INTRAVENOUS
  Filled 2016-03-17: qty 2

## 2016-03-17 MED ORDER — POTASSIUM CHLORIDE ER 10 MEQ PO TBCR: 10.0000 meq | EXTENDED_RELEASE_TABLET | Freq: Every day | ORAL | Status: DC

## 2016-03-17 NOTE — Discharge Instructions (Signed)
This morning, you were evaluated for chronic diarrhea, abdominal pain, nausea, vomiting, and intermittent low-grade fever was found that you do have a urinary tract infection.  Your potassium was quite low and this is been supplemented in the emergency department.  You have also been given a prescription for oral potassium that you can take at home for the next 10 days.  You've been given a prescription for Septra to treat her urinary tract infection.  Please take this as directed until all tablets have been completed.  Please make an appointment with your primary care physician for follow-up care in the next 7-10 days

## 2016-03-17 NOTE — ED Notes (Addendum)
Pt reporting nausea and pain in back of 8/10 at this time. PA notified

## 2016-03-17 NOTE — ED Notes (Signed)
Patient transported to X-ray 

## 2016-03-18 LAB — URINE CULTURE
Culture: <10000 — AB
SPECIAL REQUESTS: NORMAL

## 2016-03-19 ENCOUNTER — Encounter (HOSPITAL_COMMUNITY): Payer: Self-pay

## 2016-03-19 ENCOUNTER — Emergency Department (HOSPITAL_COMMUNITY)
Admission: EM | Admit: 2016-03-19 | Discharge: 2016-03-19 | Disposition: A | Discharge status: Home / Self Care | Payer: Medicare Other | Attending: Emergency Medicine | Admitting: Emergency Medicine

## 2016-03-19 DIAGNOSIS — M5136 Other intervertebral disc degeneration, lumbar region: Secondary | ICD-10-CM | POA: Diagnosis not present

## 2016-03-19 DIAGNOSIS — F329 Major depressive disorder, single episode, unspecified: Secondary | ICD-10-CM | POA: Insufficient documentation

## 2016-03-19 DIAGNOSIS — F17210 Nicotine dependence, cigarettes, uncomplicated: Secondary | ICD-10-CM | POA: Diagnosis not present

## 2016-03-19 DIAGNOSIS — Z79899 Other long term (current) drug therapy: Secondary | ICD-10-CM | POA: Diagnosis not present

## 2016-03-19 DIAGNOSIS — F32A Depression, unspecified: Secondary | ICD-10-CM

## 2016-03-19 DIAGNOSIS — F909 Attention-deficit hyperactivity disorder, unspecified type: Secondary | ICD-10-CM | POA: Insufficient documentation

## 2016-03-19 DIAGNOSIS — J449 Chronic obstructive pulmonary disease, unspecified: Secondary | ICD-10-CM | POA: Insufficient documentation

## 2016-03-19 LAB — CBC WITH DIFFERENTIAL/PLATELET
BASOS ABS: 0.1 10*3/uL (ref 0.0–0.1)
BASOS PCT: 1 %
EOS PCT: 1 %
Eosinophils Absolute: 0.1 10*3/uL (ref 0.0–0.7)
HCT: 43.2 % (ref 36.0–46.0)
Hemoglobin: 15 g/dL (ref 12.0–15.0)
LYMPHS PCT: 17 %
Lymphs Abs: 2 10*3/uL (ref 0.7–4.0)
MCH: 30.8 pg (ref 26.0–34.0)
MCHC: 34.7 g/dL (ref 30.0–36.0)
MCV: 88.7 fL (ref 78.0–100.0)
MONO ABS: 0.9 10*3/uL (ref 0.1–1.0)
Monocytes Relative: 7 %
Neutro Abs: 9 10*3/uL — ABNORMAL HIGH (ref 1.7–7.7)
Neutrophils Relative %: 74 %
PLATELETS: 247 10*3/uL (ref 150–400)
RBC: 4.87 MIL/uL (ref 3.87–5.11)
RDW: 12.9 % (ref 11.5–15.5)
WBC: 12 10*3/uL — AB (ref 4.0–10.5)

## 2016-03-19 LAB — BASIC METABOLIC PANEL
ANION GAP: 9 (ref 5–15)
BUN: 8 mg/dL (ref 6–20)
CALCIUM: 9.5 mg/dL (ref 8.9–10.3)
CO2: 23 mmol/L (ref 22–32)
Chloride: 104 mmol/L (ref 101–111)
Creatinine, Ser: 0.89 mg/dL (ref 0.44–1.00)
GLUCOSE: 97 mg/dL (ref 65–99)
POTASSIUM: 3.2 mmol/L — AB (ref 3.5–5.1)
SODIUM: 136 mmol/L (ref 135–145)

## 2016-03-19 LAB — ETHANOL

## 2016-03-19 MED ORDER — SULFAMETHOXAZOLE-TRIMETHOPRIM 800-160 MG PO TABS: 1.0000 | ORAL_TABLET | Freq: Two times a day (BID) | ORAL | Status: AC

## 2016-03-19 MED ORDER — LORAZEPAM 0.5 MG PO TABS: 0.5000 mg | ORAL_TABLET | Freq: Once | ORAL | Status: DC

## 2016-03-19 MED ORDER — PANTOPRAZOLE SODIUM 40 MG PO TBEC: 80.0000 mg | DELAYED_RELEASE_TABLET | Freq: Every day | ORAL | Status: DC

## 2016-03-19 MED ORDER — SULFAMETHOXAZOLE-TRIMETHOPRIM 800-160 MG PO TABS: 1.0000 | ORAL_TABLET | Freq: Once | ORAL | Status: DC

## 2016-03-19 MED ORDER — LORAZEPAM 1 MG PO TABS: 1.0000 mg | ORAL_TABLET | Freq: Three times a day (TID) | ORAL | Status: DC | PRN

## 2016-03-19 MED ORDER — SULFAMETHOXAZOLE-TRIMETHOPRIM 800-160 MG PO TABS: 1.0000 | ORAL_TABLET | Freq: Two times a day (BID) | ORAL | Status: DC

## 2016-03-19 MED ORDER — ACETAMINOPHEN 325 MG PO TABS: 650.0000 mg | ORAL_TABLET | ORAL | Status: DC | PRN

## 2016-03-19 MED ORDER — POTASSIUM CHLORIDE CRYS ER 20 MEQ PO TBCR: 40.0000 meq | EXTENDED_RELEASE_TABLET | Freq: Once | ORAL | Status: DC

## 2016-03-19 MED ORDER — ONDANSETRON 4 MG PO TBDP
4.0000 mg | ORAL_TABLET | Freq: Once | ORAL | Status: DC
Start: 2016-03-19 — End: 2016-03-20

## 2016-03-19 NOTE — BH Assessment (Signed)
Assessment completed. Consulted with Darlyne Russian, PA-C who recommends referring patient to an IOP program or outpatient treatment if patient declines IOP.   Rosalin Hawking, LCSW Therapeutic Triage Specialist [place] 03/19/2016 8:59 PM

## 2016-03-19 NOTE — Discharge Instructions (Signed)
Take Ativan is prescribed only as needed. Please follow-up with your psychiatrist, or with IOP program day you were recommended. Return if any thoughts of hurting yourrself or anyone else.   Major Depressive Disorder Major depressive disorder is a mental illness. It also may be called clinical depression or unipolar depression. Major depressive disorder usually causes feelings of sadness, hopelessness, or helplessness. Some people with this disorder do not feel particularly sad but lose interest in doing things they used to enjoy (anhedonia). Major depressive disorder also can cause physical symptoms. It can interfere with work, school, relationships, and other normal everyday activities. The disorder varies in severity but is longer lasting and more serious than the sadness we all feel from time to time in our lives. Major depressive disorder often is triggered by stressful life events or major life changes. Examples of these triggers include divorce, loss of your job or home, a move, and the death of a family member or close friend. Sometimes this disorder occurs for no obvious reason at all. People who have family members with major depressive disorder or bipolar disorder are at higher risk for developing this disorder, with or without life stressors. Major depressive disorder can occur at any age. It may occur just once in your life (single episode major depressive disorder). It may occur multiple times (recurrent major depressive disorder). SYMPTOMS People with major depressive disorder have either anhedonia or depressed mood on nearly a daily basis for at least 2 weeks or longer. Symptoms of depressed mood include:  Feelings of sadness (blue or down in the dumps) or emptiness.  Feelings of hopelessness or helplessness.  Tearfulness or episodes of crying (may be observed by others).  Irritability (children and adolescents). In addition to depressed mood or anhedonia or both, people with this  disorder have at least four of the following symptoms:  Difficulty sleeping or sleeping too much.   Significant change (increase or decrease) in appetite or weight.   Lack of energy or motivation.  Feelings of guilt and worthlessness.   Difficulty concentrating, remembering, or making decisions.  Unusually slow movement (psychomotor retardation) or restlessness (as observed by others).   Recurrent wishes for death, recurrent thoughts of self-harm (suicide), or a suicide attempt. People with major depressive disorder commonly have persistent negative thoughts about themselves, other people, and the world. People with severe major depressive disorder may experiencedistorted beliefs or perceptions about the world (psychotic delusions). They also may see or hear things that are not real (psychotic hallucinations). DIAGNOSIS Major depressive disorder is diagnosed through an assessment by your health care provider. Your health care provider will ask aboutaspects of your daily life, such as mood,sleep, and appetite, to see if you have the diagnostic symptoms of major depressive disorder. Your health care provider may ask about your medical history and use of alcohol or drugs, including prescription medicines. Your health care provider also may do a physical exam and blood work. This is because certain medical conditions and the use of certain substances can cause major depressive disorder-like symptoms (secondary depression). Your health care provider also may refer you to a mental health specialist for further evaluation and treatment. TREATMENT It is important to recognize the symptoms of major depressive disorder and seek treatment. The following treatments can be prescribed for this disorder:   Medicine. Antidepressant medicines usually are prescribed. Antidepressant medicines are thought to correct chemical imbalances in the brain that are commonly associated with major depressive  disorder. Other types of medicine may be added  if the symptoms do not respond to antidepressant medicines alone or if psychotic delusions or hallucinations occur.  Talk therapy. Talk therapy can be helpful in treating major depressive disorder by providing support, education, and guidance. Certain types of talk therapy also can help with negative thinking (cognitive behavioral therapy) and with relationship issues that trigger this disorder (interpersonal therapy). A mental health specialist can help determine which treatment is best for you. Most people with major depressive disorder do well with a combination of medicine and talk therapy. Treatments involving electrical stimulation of the brain can be used in situations with extremely severe symptoms or when medicine and talk therapy do not work over time. These treatments include electroconvulsive therapy, transcranial magnetic stimulation, and vagal nerve stimulation.   This information is not intended to replace advice given to you by your health care provider. Make sure you discuss any questions you have with your health care provider.   Document Released: 02/06/2013 Document Revised: 11/02/2014 Document Reviewed: 02/06/2013 Elsevier Interactive Patient Education [employer].

## 2016-03-19 NOTE — ED Notes (Addendum)
Per EMS, Pt, from home, c/o increased depression and anxiety.  Pt reports that she has been off depression medications x 1 month.  Sts depression started x 4 years ago when she found her deceased mother.  Denies SI/HI/AV.      Pt was recently diagnosed w/ a UTI and reports that she finished meds.

## 2016-03-19 NOTE — BH Assessment (Signed)
Spoke with patient about MH-IOP and she states that she prefers to go to Dr. Toy Care due to the history she has with Dr. Toy Care. She reports that she would like to be placed on her medications and states that she has not had her psychotropic medications for over one year. Patient reports that she would like those restarted because "they worked" patient reports that she will call Dr. Toy Care in the morning to schedule an appointment. Patient was encouraged to request a hospital follow-up appointment with Dr. Toy Care. Provided patient with information for IOP in case she changes her mind and decides to follow up with IOP. Patient denies further questions or concerns at this time. Informed EDPA of this information.   Rosalin Hawking, LCSW Therapeutic Triage Specialist [place] 03/19/2016 9:44 PM

## 2016-03-19 NOTE — ED Provider Notes (Signed)
CSN: [id number]     Arrival date & time 03/19/16  1833 History  By signing my name below, I, Melanie Caldwell, attest that this documentation has been prepared under the direction and in the presence of non-physician practitioner, Jeannett Senior, PA-C. Electronically Signed: Rowan Caldwell, Scribe. 03/19/2016. 7:54 PM.   Chief Complaint  Patient presents with  . Depression    The history is provided by the patient. No language interpreter was used.   HPI Comments:  Melanie Caldwell is a 60 y.o. female with PMHx of depression who presents to the Emergency Department complaining of worsening depression. Pt states she found her mom dead 4 years ago and her father died 20-Nov-2022 of this year. Pt states she is home by herself all the time and her best friend recently moved. She has been struggling with depression since the death of her mother. Pt used to see Dr. Robina Ade for therapy, but states she was only given medicine and was not allowed to talk. She states she was sent to see a stranger which upset her and she stopped going. Pt has been off her medications for a month or two; she states "I know now the medication helps". Pt was evaluated in the ED 2 days ago and treated for a UTI. Feeling better.  Denies thoughts of hurting herself.   Past Medical History  Diagnosis Date  . Depression   . GERD (gastroesophageal reflux disease)   . History of nephrolithiasis   . History of UTI   . ADD (attention deficit disorder)   . Recreational drug use     History  . Tobacco abuse   . Asthma   . Anxiety disorder   . COPD (chronic obstructive pulmonary disease) ([place])   . Fibroid uterus   . Bulging lumbar disc   . DDD (degenerative disc disease), lumbar   . Pyloric stenosis    Past Surgical History  Procedure Laterality Date  . Lithotripsy  1985  . Tonsillectomy    . Exploration middle ear      with revision of left stapedectomy and replacement of prosthesis  . Fetal surgery for congenital  hernia    . Hernia repair    . Esophageal dilation     Family History  Problem Relation Age of Onset  . Coronary artery disease Mother   . Hypertension Mother   . Diabetes Father   . Colon cancer Maternal Grandfather    Social History  Substance Use Topics  . Smoking status: Current Every Day Smoker -- 0.50 packs/day    Types: Cigarettes  . Smokeless tobacco: Never Used     Comment: Patient given counseling sheet to quit smoking; now smoking :hookah with 1 cigarette daily....  . Alcohol Use: Yes     Comment: hx of heavy Etoh use in the past; now socially   OB History    No data available     Review of Systems  Constitutional: Negative for fever and chills.  Genitourinary: Negative.   Neurological: Negative for weakness and headaches.  Psychiatric/Behavioral: Positive for dysphoric mood. Negative for suicidal ideas and self-injury.  All other systems reviewed and are negative.    Allergies  Adhesive; Ceftin; Cefuroxime axetil; and Codeine  Home Medications   Prior to Admission medications   Medication Sig Start Date End Date Taking? Authorizing Provider  amphetamine-dextroamphetamine (ADDERALL) 30 MG tablet Take 30 mg by mouth 2 (two) times daily. Reported on 03/16/2016   Yes Historical Provider, MD  citalopram (CELEXA)  40 MG tablet Take 40 mg by mouth daily.    Yes Historical Provider, MD  HYDROmorphone (DILAUDID) 4 MG tablet Take 1 tablet by mouth 2 (two) times daily. 02/26/16  Yes Historical Provider, MD  LORazepam (ATIVAN) 1 MG tablet Take 1 tablet by mouth daily as needed. anxiety 12/29/13  Yes Historical Provider, MD  omeprazole (PRILOSEC) 40 MG capsule Take 1 capsule (40 mg total) by mouth daily. 02/26/15  Yes Ladene Artist, MD  ondansetron (ZOFRAN-ODT) 4 MG disintegrating tablet Take 1 tablet (4 mg total) by mouth once. 03/17/16  Yes [name], NP  albuterol (PROVENTIL HFA;VENTOLIN HFA) 108 (90 BASE) MCG/ACT inhaler Inhale 1-2 puffs into the lungs every 6 (six) hours  as needed for wheezing. 12/29/12   Varney Biles, MD  dicyclomine (BENTYL) 10 MG capsule Take 1 capsule (10 mg total) by mouth 4 (four) times daily as needed for spasms. Patient not taking: Reported on 03/16/2016 04/03/15   Ladene Artist, MD  potassium chloride (K-DUR) 10 MEQ tablet Take 1 tablet (10 mEq total) by mouth daily. 03/17/16   [name], NP  sulfamethoxazole-trimethoprim (BACTRIM DS,SEPTRA DS) 800-160 MG tablet Take 1 tablet by mouth 2 (two) times daily. 03/17/16   [name], NP   BP 115/58 mmHg  Pulse 103  Temp(Src) 98.6 F (37 C) (Oral)  Resp 16  SpO2 96% Physical Exam  Constitutional: She appears well-developed and well-nourished. No distress.  HENT:  Head: Normocephalic.  Eyes: Conjunctivae are normal.  Neck: Neck supple.  Cardiovascular: Normal rate, regular rhythm and normal heart sounds.   Pulmonary/Chest: Effort normal and breath sounds normal. No respiratory distress. She has no wheezes. She has no rales.  Abdominal: Soft. Bowel sounds are normal. She exhibits no distension. There is no tenderness. There is no rebound.  Musculoskeletal: She exhibits no edema.  Neurological: She is alert.  Skin: Skin is warm and dry.  Psychiatric: Her behavior is normal.  Tearful, appears anxious  Nursing note and vitals reviewed.   ED Course  Procedures  DIAGNOSTIC STUDIES:  Oxygen Saturation is 96% on RA, normal by my interpretation.    COORDINATION OF CARE:  7:49 PM Will consult TTS. Discussed treatment plan with pt at bedside and pt agreed to plan.  Labs Review Labs Reviewed  CBC WITH DIFFERENTIAL/PLATELET - Abnormal; Notable for the following:    WBC 12.0 (*)    Neutro Abs 9.0 (*)    All other components within normal limits  BASIC METABOLIC PANEL - Abnormal; Notable for the following:    Potassium 3.2 (*)    All other components within normal limits  ETHANOL  URINE RAPID DRUG SCREEN, HOSP PERFORMED    Imaging Review No results found. I have personally  reviewed and evaluated these images and lab results as part of my medical decision-making.   EKG Interpretation None      MDM   Final diagnoses:  Depression   Patient emergency department with worsening depression. Although she told us she has not had her medications for several months, we confirmed that she has not had her medications, Celexa and Adderall, as well as Ativan for over a year. I will get medical clearance labs and consult TTS. Patient was recently also seen for UTI, currently on antibiotics. States she is feeling better.   Patient was assessed by [facility]. They offered her IOP. She refused, and told him she will follow-up with her psychiatrist. I will give her 6 tablets of Ativan to take as needed. Return  precautions discussed. Patient again denies suicidal or homicidal ideations at this time. And is stable for discharge home at this time.  Patient is telling me she lost her prescription for Bactrim. I will prescribe it again. I gave her a dose in emergency department before she left. Bowel, nontoxic appearing.  Filed Vitals:   03/19/16 1839  BP: 115/58  Pulse: 103  Temp: 98.6 F (37 C)  TempSrc: Oral  Resp: 16  SpO2: 96%    I personally performed the services described in this documentation, which was scribed in my presence. The recorded information has been reviewed and is accurate.   Jeannett Senior, PA-C 03/20/16 2017  Malvin Johns, MD 03/20/16 2140

## 2016-03-19 NOTE — BH Assessment (Addendum)
Assessment Note  Melanie Caldwell is an 60 y.o. female presenting to [facility] voluntarily for depression reporing "I just can't do anything and i don't have anyone to help me, there is only so much I can do because I have that bad back." Patient denies SI and history of attempts reporting "I don't believe in that." Patient denies self-injurious behaviors. Patient denies HI and history of aggression. Patient denies access to firearms or weapons. Patient denies legal history. Patient denies AVH and does not appear to be responding to internal stimuli. Patient denies use of drugs and alcohol. Patient UDS not collected and BAL <5 at time of assessment.  Patient reports recent stressors as parents passing away reporting that her mother passed away four years ago and her father passing away in January. Patient reports that she is alone and her best friend moved down the street. Patient reports that she is alone and cannot "get things done around the house." Patient endorses symptoms of depression as; insomnia, tearfulness, isolation, fatigue, loss of appetite, and loss of interest in pleasurable activities. Patient reports that she feels worthless "sometimes" and denies other symptoms of depression. Patient denies previous inpatient treatment and reports that she saw Dr. Toy Care on an outpatient basis for depression until one year ago. Patient contracts for safety at time of assessment.  Consulted with Darlyne Russian, PA-C who recommends patient be referred to IOP and if patient refuses to refer to outpatient treatment due to patient not meeting inpatient criteria at this time.    Diagnosis: Adjustment Disorder with mixed anxiety and depressed mood  Past Medical History:  Past Medical History  Diagnosis Date  . Depression   . GERD (gastroesophageal reflux disease)   . History of nephrolithiasis   . History of UTI   . ADD (attention deficit disorder)   . Recreational drug use     History  . Tobacco abuse   .  Asthma   . Anxiety disorder   . COPD (chronic obstructive pulmonary disease) ([place])   . Fibroid uterus   . Bulging lumbar disc   . DDD (degenerative disc disease), lumbar   . Pyloric stenosis     Past Surgical History  Procedure Laterality Date  . Lithotripsy  1985  . Tonsillectomy    . Exploration middle ear      with revision of left stapedectomy and replacement of prosthesis  . Fetal surgery for congenital hernia    . Hernia repair    . Esophageal dilation      Family History:  Family History  Problem Relation Age of Onset  . Coronary artery disease Mother   . Hypertension Mother   . Diabetes Father   . Colon cancer Maternal Grandfather     Social History:  reports that she has been smoking Cigarettes.  She has been smoking about 0.50 packs per day. She has never used smokeless tobacco. She reports that she drinks alcohol. She reports that she does not use illicit drugs.  Additional Social History:  Alcohol / Drug Use Pain Medications: See PTA Prescriptions: See PTA Over the Counter: See PTA History of alcohol / drug use?: No history of alcohol / drug abuse  CIWA: CIWA-Ar BP: 122/72 mmHg Pulse Rate: 87 COWS:    Allergies:  Allergies  Allergen Reactions  . Adhesive [Tape]     Blisters.   . Ceftin [Cefuroxime Axetil]   . Cefuroxime Axetil Itching  . Codeine Nausea And Vomiting    Home Medications:  (Not in  a hospital admission)  OB/GYN Status:  No LMP recorded. Patient is postmenopausal.  General Assessment Data Location of Assessment: [facility] TTS Assessment: In system Is this a Tele or Face-to-Face Assessment?: Face-to-Face Is this an Initial Assessment or a Re-assessment for this encounter?: Initial Assessment Marital status: Single Maiden name: [place] Is patient pregnant?: No Pregnancy Status: No Living Arrangements: Alone Can pt return to current living arrangement?: Yes Admission Status: Voluntary Is patient capable of signing voluntary  admission?: Yes Referral Source: Self/Family/Friend     Crisis Care Plan Living Arrangements: Alone Name of Psychiatrist: None Name of Therapist: None  Education Status Is patient currently in school?: No Highest grade of school patient has completed: 12th  Risk to self with the past 6 months Suicidal Ideation: No ("I don't believe in that") Has patient been a risk to self within the past 6 months prior to admission? : No Suicidal Intent: No Has patient had any suicidal intent within the past 6 months prior to admission? : No Is patient at risk for suicide?: No Suicidal Plan?: No Has patient had any suicidal plan within the past 6 months prior to admission? : No Access to Means: No What has been your use of drugs/alcohol within the last 12 months?: Denies Previous Attempts/Gestures: No How many times?: 0 Other Self Harm Risks: Denies Triggers for Past Attempts: None known Intentional Self Injurious Behavior: None Family Suicide History: No Recent stressful life event(s): Other (Comment) (living alone, best friend moved) Persecutory voices/beliefs?: No Depression: Yes Depression Symptoms: Insomnia, Tearfulness, Isolating, Fatigue, Loss of interest in usual pleasures Substance abuse history and/or treatment for substance abuse?: No Suicide prevention information given to non-admitted patients: Not applicable  Risk to Others within the past 6 months Homicidal Ideation: No Does patient have any lifetime risk of violence toward others beyond the six months prior to admission? : No Thoughts of Harm to Others: No Current Homicidal Intent: No Current Homicidal Plan: No Access to Homicidal Means: No Identified Victim: Denies History of harm to others?: No Assessment of Violence: None Noted Violent Behavior Description: Denies Does patient have access to weapons?: No Criminal Charges Pending?: No Does patient have a court date: No Is patient on probation?:  No  Psychosis Hallucinations: None noted Delusions: None noted  Mental Status Report Appearance/Hygiene: In scrubs Eye Contact: Good Level of Consciousness: Alert Mood: Depressed Affect: Appropriate to circumstance, Depressed Anxiety Level: None Thought Processes: Coherent, Relevant Judgement: Unimpaired Orientation: Person, Place, Time, Situation, Appropriate for developmental age Obsessive Compulsive Thoughts/Behaviors: None  Cognitive Functioning Concentration: Decreased Memory: Recent Intact, Remote Intact IQ: Average Insight: Fair Impulse Control: Fair Appetite: Poor Sleep: Decreased Total Hours of Sleep: 5 Vegetative Symptoms: Decreased grooming  ADLScreening [facility] Assessment Services) Patient's cognitive ability adequate to safely complete daily activities?: No Patient able to express need for assistance with ADLs?: Yes Independently performs ADLs?: Yes (appropriate for developmental age)  Prior Inpatient Therapy Prior Inpatient Therapy: No Prior Therapy Dates: N/A Prior Therapy Facilty/Provider(s): N/A Reason for Treatment: N/A  Prior Outpatient Therapy Prior Outpatient Therapy: Yes Prior Therapy Dates: 1985-2016 Prior Therapy Facilty/Provider(s): Dr. Toy Care Reason for Treatment: Depression Does patient have an ACCT team?: No Does patient have Intensive In-House Services?  : No Does patient have [facility] services? : No Does patient have P4CC services?: No  ADL Screening (condition at time of admission) Patient's cognitive ability adequate to safely complete daily activities?: No Is the patient deaf or have difficulty hearing?: No Does the patient have difficulty seeing, even  when wearing glasses/contacts?: No Does the patient have difficulty concentrating, remembering, or making decisions?: No Patient able to express need for assistance with ADLs?: Yes Does the patient have difficulty dressing or bathing?: No Independently performs ADLs?: Yes (appropriate  for developmental age) Does the patient have difficulty walking or climbing stairs?: Yes  Home Assistive Devices/Equipment Home Assistive Devices/Equipment: None  Therapy Consults (therapy consults require a physician order) PT Evaluation Needed: No OT Evalulation Needed: No SLP Evaluation Needed: No Abuse/Neglect Assessment (Assessment to be complete while patient is alone) Physical Abuse: Denies Verbal Abuse: Denies Sexual Abuse: Denies Exploitation of patient/patient's resources: Denies Self-Neglect: Denies Values / Beliefs Cultural Requests During Hospitalization: None Spiritual Requests During Hospitalization: None Consults Spiritual Care Consult Needed: No Social Work Consult Needed: No Regulatory affairs officer (For Healthcare) Does patient have an advance directive?: No Would patient like information on creating an advanced directive?: Yes Higher education careers adviser given    Additional Information 1:1 In Past 12 Months?: No CIRT Risk: No Elopement Risk: No Does patient have medical clearance?: No     Disposition:  Disposition Initial Assessment Completed for this Encounter: Yes Disposition of Patient: Outpatient treatment, Referred to (referred to IOP, patient declined) Type of outpatient treatment: Adult Patient referred to: Outpatient clinic referral (refer back to opt provider per Darlyne Russian, PA-C)  On Site Evaluation by:   Reviewed with Physician:    Melanie Caldwell 03/20/2016 12:22 AM

## 2016-03-21 ENCOUNTER — Emergency Department (HOSPITAL_COMMUNITY)
Admission: EM | Admit: 2016-03-21 | Discharge: 2016-03-21 | Disposition: A | Discharge status: Home / Self Care | Payer: Medicare Other | Attending: Emergency Medicine | Admitting: Emergency Medicine

## 2016-03-21 ENCOUNTER — Encounter (HOSPITAL_COMMUNITY): Payer: Self-pay

## 2016-03-21 DIAGNOSIS — Z792 Long term (current) use of antibiotics: Secondary | ICD-10-CM | POA: Diagnosis not present

## 2016-03-21 DIAGNOSIS — F17210 Nicotine dependence, cigarettes, uncomplicated: Secondary | ICD-10-CM | POA: Diagnosis not present

## 2016-03-21 DIAGNOSIS — R112 Nausea with vomiting, unspecified: Secondary | ICD-10-CM | POA: Diagnosis not present

## 2016-03-21 DIAGNOSIS — E876 Hypokalemia: Secondary | ICD-10-CM | POA: Diagnosis not present

## 2016-03-21 DIAGNOSIS — R197 Diarrhea, unspecified: Secondary | ICD-10-CM | POA: Diagnosis not present

## 2016-03-21 DIAGNOSIS — Z79899 Other long term (current) drug therapy: Secondary | ICD-10-CM | POA: Diagnosis not present

## 2016-03-21 DIAGNOSIS — J449 Chronic obstructive pulmonary disease, unspecified: Secondary | ICD-10-CM | POA: Diagnosis not present

## 2016-03-21 LAB — CBC WITH DIFFERENTIAL/PLATELET
Basophils Absolute: 0.1 10*3/uL (ref 0.0–0.1)
Basophils Relative: 1 %
EOS PCT: 2 %
Eosinophils Absolute: 0.2 10*3/uL (ref 0.0–0.7)
HEMATOCRIT: 41.1 % (ref 36.0–46.0)
Hemoglobin: 13.8 g/dL (ref 12.0–15.0)
LYMPHS ABS: 2 10*3/uL (ref 0.7–4.0)
LYMPHS PCT: 25 %
MCH: 30.1 pg (ref 26.0–34.0)
MCHC: 33.6 g/dL (ref 30.0–36.0)
MCV: 89.5 fL (ref 78.0–100.0)
MONO ABS: 0.7 10*3/uL (ref 0.1–1.0)
MONOS PCT: 9 %
NEUTROS ABS: 5.1 10*3/uL (ref 1.7–7.7)
Neutrophils Relative %: 63 %
PLATELETS: 220 10*3/uL (ref 150–400)
RBC: 4.59 MIL/uL (ref 3.87–5.11)
RDW: 13.2 % (ref 11.5–15.5)
WBC: 8 10*3/uL (ref 4.0–10.5)

## 2016-03-21 LAB — COMPREHENSIVE METABOLIC PANEL
ALT: 19 U/L (ref 14–54)
ANION GAP: 10 (ref 5–15)
AST: 27 U/L (ref 15–41)
Albumin: 3.7 g/dL (ref 3.5–5.0)
Alkaline Phosphatase: 84 U/L (ref 38–126)
BILIRUBIN TOTAL: 0.3 mg/dL (ref 0.3–1.2)
BUN: 5 mg/dL — AB (ref 6–20)
CO2: 22 mmol/L (ref 22–32)
Calcium: 9 mg/dL (ref 8.9–10.3)
Chloride: 105 mmol/L (ref 101–111)
Creatinine, Ser: 0.95 mg/dL (ref 0.44–1.00)
Glucose, Bld: 93 mg/dL (ref 65–99)
POTASSIUM: 2.9 mmol/L — AB (ref 3.5–5.1)
Sodium: 137 mmol/L (ref 135–145)
TOTAL PROTEIN: 6.8 g/dL (ref 6.5–8.1)

## 2016-03-21 LAB — URINALYSIS, ROUTINE W REFLEX MICROSCOPIC
Bilirubin Urine: NEGATIVE
Glucose, UA: NEGATIVE mg/dL
Hgb urine dipstick: NEGATIVE
Ketones, ur: NEGATIVE mg/dL
LEUKOCYTES UA: NEGATIVE
Nitrite: NEGATIVE
PROTEIN: NEGATIVE mg/dL
Specific Gravity, Urine: 1.007 (ref 1.005–1.030)
pH: 6 (ref 5.0–8.0)

## 2016-03-21 LAB — LIPASE, BLOOD: LIPASE: 32 U/L (ref 11–51)

## 2016-03-21 MED ORDER — MORPHINE SULFATE (PF) 4 MG/ML IV SOLN
4.0000 mg | Freq: Once | INTRAVENOUS | Status: AC
  Administered 2016-03-21: 4 mg via INTRAVENOUS
  Filled 2016-03-21: qty 1

## 2016-03-21 MED ORDER — SODIUM CHLORIDE 0.9 % IV BOLUS (SEPSIS)
1000.0000 mL | Freq: Once | INTRAVENOUS | Status: AC
  Administered 2016-03-21: 1000 mL via INTRAVENOUS

## 2016-03-21 MED ORDER — ONDANSETRON HCL 4 MG/2ML IJ SOLN
4.0000 mg | Freq: Once | INTRAMUSCULAR | Status: AC
  Administered 2016-03-21: 4 mg via INTRAVENOUS
  Filled 2016-03-21: qty 2

## 2016-03-21 MED ORDER — DIPHENHYDRAMINE HCL 50 MG/ML IJ SOLN
25.0000 mg | Freq: Once | INTRAMUSCULAR | Status: AC
  Administered 2016-03-21: 25 mg via INTRAVENOUS
  Filled 2016-03-21: qty 1

## 2016-03-21 MED ORDER — POTASSIUM CHLORIDE 10 MEQ/100ML IV SOLN
10.0000 meq | Freq: Once | INTRAVENOUS | Status: AC
  Administered 2016-03-21: 10 meq via INTRAVENOUS
  Filled 2016-03-21: qty 100

## 2016-03-21 MED ORDER — POTASSIUM CHLORIDE ER 10 MEQ PO TBCR: 10.0000 meq | EXTENDED_RELEASE_TABLET | Freq: Every day | ORAL | Status: DC

## 2016-03-21 MED ORDER — METOCLOPRAMIDE HCL 5 MG/ML IJ SOLN
10.0000 mg | Freq: Once | INTRAMUSCULAR | Status: AC
  Administered 2016-03-21: 10 mg via INTRAVENOUS
  Filled 2016-03-21: qty 2

## 2016-03-21 MED ORDER — PROMETHAZINE HCL 25 MG PO TABS: 25.0000 mg | ORAL_TABLET | Freq: Four times a day (QID) | ORAL | Status: DC | PRN

## 2016-03-21 NOTE — ED Provider Notes (Signed)
CSN: [id number]     Arrival date & time 03/21/16  1242 History   First MD Initiated Contact with Patient 03/21/16 1250     Chief Complaint  Patient presents with  . Emesis  . Diarrhea   HPI  Melanie Caldwell is a 60 year old female with PMHx of GERD, COPD, chronic back pain, chronic abdominal pain and IBS presenting with nausea, vomiting and diarrhea. Pt reports seeking care from ED 4 days ago for NVD and was found to have a UTI. She reports no improvement with her symptoms on the zofran that she was discharged home with. She states that phenergan typically works best for her nausea. She reports not being able to keep any food or fluids down due to her severe nausea. She has been taking imodium for her diarrhea and states that it has not improved it. She has not had an episode of diarrhea today; last episode yesterday. She does note that she has a history of chronic diarrhea related to IBS and it is typically intermittent for weeks at a time. Denies blood in her stool or vomit. She also complains of chronic RLQ pain from a hernia repair surgery 8 years ago. She states that her RLQ feels bloated. This is a daily pain and has not changed today. She admits to non-compliance with her bactrim because the pill is too big. She previously followed with a gastroenterologist for her chronic abdominal issues but has not follow with him in 2 years. She states he will not fill her IBS medications because she has missed her follow up appts. She also endorses chronic back pain which she takes PO dilaudid for. No change in back pain today. No GU complaints. She has no other new complaints.  Chart review 5/26 - Seen for worsening depression. Had reported feeling better since being seen for her UTI and had no complaints of nausea, vomiting and diarrhea.  5/23 - Seen for fever, cough, nausea, vomiting, right flank pain and RLQ pain. Noted diarrhea but reported it was due to IBS. Found to be hypokalemic with UTI. Potassium  repleted and discharged with bactrim.   Past Medical History  Diagnosis Date  . Depression   . GERD (gastroesophageal reflux disease)   . History of nephrolithiasis   . History of UTI   . ADD (attention deficit disorder)   . Recreational drug use     History  . Tobacco abuse   . Asthma   . Anxiety disorder   . COPD (chronic obstructive pulmonary disease) ([place])   . Fibroid uterus   . Bulging lumbar disc   . DDD (degenerative disc disease), lumbar   . Pyloric stenosis    Past Surgical History  Procedure Laterality Date  . Lithotripsy  1985  . Tonsillectomy    . Exploration middle ear      with revision of left stapedectomy and replacement of prosthesis  . Fetal surgery for congenital hernia    . Hernia repair    . Esophageal dilation     Family History  Problem Relation Age of Onset  . Coronary artery disease Mother   . Hypertension Mother   . Diabetes Father   . Colon cancer Maternal Grandfather    Social History  Substance Use Topics  . Smoking status: Current Every Day Smoker -- 0.50 packs/day    Types: Cigarettes  . Smokeless tobacco: Never Used     Comment: Patient given counseling sheet to quit smoking; now smoking :hookah with 1  cigarette daily....  . Alcohol Use: Yes     Comment: hx of heavy Etoh use in the past; now socially   OB History    No data available     Review of Systems  All other systems reviewed and are negative.     Allergies  Adhesive and Ceftin  Home Medications   Prior to Admission medications   Medication Sig Start Date End Date Taking? Authorizing Provider  amphetamine-dextroamphetamine (ADDERALL) 30 MG tablet Take 30 mg by mouth 2 (two) times daily. Reported on 03/16/2016   Yes Historical Provider, MD  citalopram (CELEXA) 40 MG tablet Take 40 mg by mouth daily.    Yes Historical Provider, MD  LORazepam (ATIVAN) 1 MG tablet Take 1 tablet (1 mg total) by mouth 3 (three) times daily as needed for anxiety. 03/19/16  Yes Tatyana  Kirichenko, PA-C  metroNIDAZOLE (FLAGYL) 500 MG tablet Take 500 mg by mouth 3 (three) times daily. 03/20/16  Yes Historical Provider, MD  ondansetron (ZOFRAN-ODT) 4 MG disintegrating tablet Take 1 tablet (4 mg total) by mouth once. 03/17/16  Yes [name], NP  potassium chloride (K-DUR) 10 MEQ tablet Take 1 tablet (10 mEq total) by mouth daily. 03/17/16  Yes [name], NP  albuterol (PROVENTIL HFA;VENTOLIN HFA) 108 (90 BASE) MCG/ACT inhaler Inhale 1-2 puffs into the lungs every 6 (six) hours as needed for wheezing. Patient not taking: Reported on 03/21/2016 12/29/12   Varney Biles, MD  dicyclomine (BENTYL) 10 MG capsule Take 1 capsule (10 mg total) by mouth 4 (four) times daily as needed for spasms. Patient not taking: Reported on 03/16/2016 04/03/15   Ladene Artist, MD  HYDROmorphone (DILAUDID) 4 MG tablet Take 1 tablet by mouth 2 (two) times daily. 02/26/16   Historical Provider, MD  omeprazole (PRILOSEC) 40 MG capsule Take 1 capsule (40 mg total) by mouth daily. Patient not taking: Reported on 03/21/2016 02/26/15   Ladene Artist, MD  potassium chloride (K-DUR) 10 MEQ tablet Take 1 tablet (10 mEq total) by mouth daily. 03/21/16   Shalinda Burkholder, PA-C  promethazine (PHENERGAN) 25 MG tablet Take 1 tablet (25 mg total) by mouth every 6 (six) hours as needed for nausea or vomiting. 03/21/16   Lahoma Crocker Kylei Purington, PA-C  sulfamethoxazole-trimethoprim (BACTRIM DS,SEPTRA DS) 800-160 MG tablet Take 1 tablet by mouth 2 (two) times daily. 03/19/16 03/26/16  Tatyana Kirichenko, PA-C   BP 97/56 mmHg  Pulse 73  Temp(Src) 98.4 F (36.9 C) (Oral)  Resp 18  Ht 4\' 11"  (1.499 m)  Wt 42.638 kg  BMI 18.98 kg/m2  SpO2 98% Physical Exam  Constitutional: She appears well-developed and well-nourished. No distress.  Nontoxic appearing  HENT:  Head: Normocephalic and atraumatic.  Mouth/Throat: Oropharynx is clear and moist.  Eyes: Conjunctivae are normal. Right eye exhibits no discharge. Left eye exhibits no discharge. No  scleral icterus.  Neck: Normal range of motion. Neck supple.  Cardiovascular: Normal rate, regular rhythm and normal heart sounds.   Pulmonary/Chest: Effort normal and breath sounds normal. No respiratory distress.  Abdominal: Soft. Bowel sounds are normal. She exhibits no distension. There is no tenderness. There is no rebound and no guarding.  Musculoskeletal: Normal range of motion.  Moves all extremities spontaneously  Neurological: She is alert. Coordination normal.  Skin: Skin is warm and dry.  Psychiatric: She has a normal mood and affect. Her behavior is normal.  Nursing note and vitals reviewed.   ED Course  Procedures (including critical care time) Labs Review Labs Reviewed  COMPREHENSIVE METABOLIC PANEL - Abnormal; Notable for the following:    Potassium 2.9 (*)    BUN 5 (*)    All other components within normal limits  CBC WITH DIFFERENTIAL/PLATELET  LIPASE, BLOOD  URINALYSIS, ROUTINE W REFLEX MICROSCOPIC (NOT AT [facility])    Imaging Review No results found. I have personally reviewed and evaluated these images and lab results as part of my medical decision-making.   EKG Interpretation None     3:00 - Re-evaluated pt. Sleeping comfortably. No episodes of vomiting or diarrhea while in ED. Pt reports significant improvement in nausea. Requesting more nausea medicine. Pt noted to be hypokalemic at 2.9. Chart review shows pt is typically low around 3.0. Pt was discharged with PO potassium at visit 4 days ago which she has not been taking. Will give potassium here. Continue to monitor.   MDM   Final diagnoses:  Non-intractable vomiting with nausea, vomiting of unspecified type  Diarrhea, unspecified type  Hypokalemia   60 year old female presenting with NVD. Hx of chronic abdominal complaints including IBS, RLQ pain and diarrhea. Afebrile and hemodynamically stable. Nontoxic appearing. Abdomen is soft, non-tender without peritoneal signs. No CVA tenderness. No  leukocytosis. Hypokalemic to 2.9 which was repleted in ED. Chart review shows pt typically has potassium around 3. Remaining blood work unremarkable. UA without sign of infection. Chart review shows pt has been seen intermittently by ED and gastro office for chronic abdominal pain, IBS, nausea, vomiting and diarrhea. Presentation today seems consistent with her chronic complaints. No peritoneal signs suggesting surgical abdomen. No episodes of vomiting or diarrhea while in ED. After receiving zofran and reglan, pt reports improved nausea and is able to tolerate PO without issue. Discussed with pt importance of medication compliance. Admits to not taking potassium supplements as prescribed. She does still have bactrim at home. Encouraged pt to finish full course of bactrim and take Kdur. She is to follow up with her PCP to have K rechecked in a week. Also encouraged gastro follow up given chronic nature of her complaints. Discharged with phenergan. Return precautions given in discharge paperwork and discussed with pt at bedside. Pt stable for discharge     Josephina Gip, PA-C 03/21/16 [place], MD 03/22/16 [id number]

## 2016-03-21 NOTE — ED Notes (Signed)
Patient states she was seen 2 days ago for N/V/D. Patient states she has a RX for Zofran and has been taking Imodium for diarrhea, but continues to have both.

## 2016-03-21 NOTE — Discharge Instructions (Signed)
Schedule an appointment with your gastroenterologist to resume your IBS medications and address your chronic diarrhea. Follow up with your PCP to get your potassium rechecked because it was low today. Return to ED with new, worsening or concerning symptoms.    Chronic Diarrhea Diarrhea is frequent loose and watery bowel movements. It can cause you to feel weak and dehydrated. Dehydration can cause you to become tired and thirsty and to have a dry mouth, decreased urination, and dark yellow urine. Diarrhea is a sign of another problem, most often an infection that will not last long. In most cases, diarrhea lasts 2-3 days. Diarrhea that lasts longer than 4 weeks is called long-lasting (chronic) diarrhea. It is important to treat your diarrhea as directed by your health care provider to lessen or prevent future episodes of diarrhea.  CAUSES  There are many causes of chronic diarrhea. The following are some possible causes:   Gastrointestinal infections caused by viruses, bacteria, or parasites.   Food poisoning or food allergies.   Certain medicines, such as antibiotics, chemotherapy, and laxatives.   Artificial sweeteners and fructose.   Digestive disorders, such as celiac disease and inflammatory bowel diseases.   Irritable bowel syndrome.  Some disorders of the pancreas.  Disorders of the thyroid.  Reduced blood flow to the intestines.  Cancer. Sometimes the cause of chronic diarrhea is unknown. RISK FACTORS  Having a severely weakened immune system, such as from HIV or AIDS.   Taking certain types of cancer-fighting drugs (such as with chemotherapy) or other medicines.   Having had a recent organ transplant.   Having a portion of the stomach or small bowel removed.   Traveling to countries where food and water supplies are often contaminated.  SYMPTOMS  In addition to frequent, loose stools, diarrhea may cause:   Cramping.   Abdominal pain.   Nausea.    Fever.  Fatigue.  Urgent need to use the bathroom.  Loss of bowel control. DIAGNOSIS  Your health care provider must take a careful history and perform a physical exam. Tests given are based on your symptoms and history. Tests may include:   Blood or stool tests. Three or more stool samples may be examined. Stool cultures may be used to test for bacteria or parasites.   X-rays.   A procedure in which a thin tube is inserted into the mouth or rectum (endoscopy). This allows the health care provider to look inside the intestine.  TREATMENT   Treatment is aimed at correcting the cause of the diarrhea when possible.  Diarrhea caused by an infection can often be treated with antibiotic medicines.  Diarrhea not caused by an infection may require you to take long-term medicine or have surgery. Specific treatment should be discussed with your health care provider.  If the cause cannot be determined, treatment aims to relieve symptoms and prevent dehydration. Serious health problems can occur if you do not maintain proper fluid levels. Treatment may include:  Taking an oral rehydration solution (ORS).  Not drinking beverages that contain caffeine (such as tea, coffee, and soft drinks).  Not drinking alcohol.  Maintaining well-balanced nutrition to help you recover faster. HOME CARE INSTRUCTIONS   Drink enough fluids to keep urine clear or pale yellow. Drink 1 cup (8 oz) of fluid for each diarrhea episode. Avoid fluids that contain simple sugars, fruit juices, whole milk products, and sodas. Hydrate with an ORS. You may purchase the ORS or prepare it at home by mixing the following ingredients  together:   - tsp (1.7-3  mL) table salt.   tsp (3  mL) baking soda.   tsp (1.7 mL) salt substitute containing potassium chloride.  1 tbsp (20 mL) sugar.  4.2 c (1 L) of water.   Certain foods and beverages may increase the speed at which food moves through the gastrointestinal  (GI) tract. These foods and beverages should be avoided. They include:  Caffeinated and alcoholic beverages.  High-fiber foods, such as raw fruits and vegetables, nuts, seeds, and whole grain breads and cereals.  Foods and beverages sweetened with sugar alcohols, such as xylitol, sorbitol, and mannitol.   Some foods may be well tolerated and may help thicken stool. These include:  Starchy foods, such as rice, toast, pasta, low-sugar cereal, oatmeal, grits, baked potatoes, crackers, and bagels.  Bananas.  Applesauce.  Add probiotic-rich foods to help increase healthy bacteria in the GI tract. These include yogurt and fermented milk products.  Wash your hands well after each diarrhea episode.  Only take over-the-counter or prescription medicines as directed by your health care provider.  Take a warm bath to relieve any burning or pain from frequent diarrhea episodes. SEEK MEDICAL CARE IF:   You are not urinating as often.  Your urine is a dark color.  You become very tired or dizzy.  You have severe pain in the abdomen or rectum.  Your have blood or pus in your stools.  Your stools look black and tarry. SEEK IMMEDIATE MEDICAL CARE IF:   You are unable to keep fluids down.  You have persistent vomiting.  You have blood in your stool.  Your stools are black and tarry.  You do not urinate in 6-8 hours, or there is only a small amount of very dark urine.  You have abdominal pain that increases or localizes.  You have weakness, dizziness, confusion, or lightheadedness.  You have a severe headache.  Your diarrhea gets worse or does not get better.  You have a fever or persistent symptoms for more than 2-3 days.  You have a fever and your symptoms suddenly get worse. MAKE SURE YOU:   Understand these instructions.  Will watch your condition.  Will get help right away if you are not doing well or get worse.   This information is not intended to replace advice  given to you by your health care provider. Make sure you discuss any questions you have with your health care provider.   Document Released: 01/02/2004 Document Revised: 10/17/2013 Document Reviewed: 04/06/2013 Elsevier Interactive Patient Education 2016 [place].  Diet for Irritable Bowel Syndrome When you have irritable bowel syndrome (IBS), the foods you eat and your eating habits are very important. IBS may cause various symptoms, such as abdominal pain, constipation, or diarrhea. Choosing the right foods can help ease discomfort caused by these symptoms. Work with your health care provider and dietitian to find the best eating plan to help control your symptoms. WHAT GENERAL GUIDELINES DO I NEED TO FOLLOW?  Keep a food diary. This will help you identify foods that cause symptoms. Write down:  What you eat and when.  What symptoms you have.  When symptoms occur in relation to your meals.  Avoid foods that cause symptoms. Talk with your dietitian about other ways to get the same nutrients that are in these foods.  Eat more foods that contain fiber. Take a fiber supplement if directed by your dietitian.  Eat your meals slowly, in a relaxed setting.  Aim  to eat 5-6 small meals per day. Do not skip meals.  Drink enough fluids to keep your urine clear or pale yellow.  Ask your health care provider if you should take an over-the-counter probiotic during flare-ups to help restore healthy gut bacteria.  If you have cramping or diarrhea, try making your meals low in fat and high in carbohydrates. Examples of carbohydrates are pasta, rice, whole grain breads and cereals, fruits, and vegetables.  If dairy products cause your symptoms to flare up, try eating less of them. You might be able to handle yogurt better than other dairy products because it contains bacteria that help with digestion. WHAT FOODS ARE NOT RECOMMENDED? The following are some foods and drinks that may worsen your  symptoms:  Fatty foods, such as [place] fries.  Milk products, such as cheese or ice cream.  Chocolate.  Alcohol.  Products with caffeine, such as coffee.  Carbonated drinks, such as soda. The items listed above may not be a complete list of foods and beverages to avoid. Contact your dietitian for more information. WHAT FOODS ARE GOOD SOURCES OF FIBER? Your health care provider or dietitian may recommend that you eat more foods that contain fiber. Fiber can help reduce constipation and other IBS symptoms. Add foods with fiber to your diet a little at a time so that your body can get used to them. Too much fiber at once might cause gas and swelling of your abdomen. The following are some foods that are good sources of fiber:  Apples.  Peaches.  Pears.  Berries.  Figs.  Broccoli (raw).  Cabbage.  Carrots.  Raw peas.  Kidney beans.  Lima beans.  Whole grain bread.  Whole grain cereal. FOR MORE INFORMATION  International Foundation for Functional Gastrointestinal Disorders: www.iffgd.[employer] of Diabetes and Digestive and Kidney Diseases: NetworkAffair.co.za.aspx   This information is not intended to replace advice given to you by your health care provider. Make sure you discuss any questions you have with your health care provider.   Document Released: 01/02/2004 Document Revised: 11/02/2014 Document Reviewed: 01/12/2014 Elsevier Interactive Patient Education 2016 Elsevier Inc.  Nausea and Vomiting Nausea is a sick feeling that often comes before throwing up (vomiting). Vomiting is a reflex where stomach contents come out of your mouth. Vomiting can cause severe loss of body fluids (dehydration). Children and elderly adults can become dehydrated quickly, especially if they also have diarrhea. Nausea and vomiting are symptoms of a condition or disease. It is important to find the cause of  your symptoms. CAUSES   Direct irritation of the stomach lining. This irritation can result from increased acid production (gastroesophageal reflux disease), infection, food poisoning, taking certain medicines (such as nonsteroidal anti-inflammatory drugs), alcohol use, or tobacco use.  Signals from the brain.These signals could be caused by a headache, heat exposure, an inner ear disturbance, increased pressure in the brain from injury, infection, a tumor, or a concussion, pain, emotional stimulus, or metabolic problems.  An obstruction in the gastrointestinal tract (bowel obstruction).  Illnesses such as diabetes, hepatitis, gallbladder problems, appendicitis, kidney problems, cancer, sepsis, atypical symptoms of a heart attack, or eating disorders.  Medical treatments such as chemotherapy and radiation.  Receiving medicine that makes you sleep (general anesthetic) during surgery. DIAGNOSIS Your caregiver may ask for tests to be done if the problems do not improve after a few days. Tests may also be done if symptoms are severe or if the reason for the nausea and vomiting is not  clear. Tests may include:  Urine tests.  Blood tests.  Stool tests.  Cultures (to look for evidence of infection).  X-rays or other imaging studies. Test results can help your caregiver make decisions about treatment or the need for additional tests. TREATMENT You need to stay well hydrated. Drink frequently but in small amounts.You may wish to drink water, sports drinks, clear broth, or eat frozen ice pops or gelatin dessert to help stay hydrated.When you eat, eating slowly may help prevent nausea.There are also some antinausea medicines that may help prevent nausea. HOME CARE INSTRUCTIONS   Take all medicine as directed by your caregiver.  If you do not have an appetite, do not force yourself to eat. However, you must continue to drink fluids.  If you have an appetite, eat a normal diet unless your  caregiver tells you differently.  Eat a variety of complex carbohydrates (rice, wheat, potatoes, bread), lean meats, yogurt, fruits, and vegetables.  Avoid high-fat foods because they are more difficult to digest.  Drink enough water and fluids to keep your urine clear or pale yellow.  If you are dehydrated, ask your caregiver for specific rehydration instructions. Signs of dehydration may include:  Severe thirst.  Dry lips and mouth.  Dizziness.  Dark urine.  Decreasing urine frequency and amount.  Confusion.  Rapid breathing or pulse. SEEK IMMEDIATE MEDICAL CARE IF:   You have blood or brown flecks (like coffee grounds) in your vomit.  You have black or bloody stools.  You have a severe headache or stiff neck.  You are confused.  You have severe abdominal pain.  You have chest pain or trouble breathing.  You do not urinate at least once every 8 hours.  You develop cold or clammy skin.  You continue to vomit for longer than 24 to 48 hours.  You have a fever. MAKE SURE YOU:   Understand these instructions.  Will watch your condition.  Will get help right away if you are not doing well or get worse.   This information is not intended to replace advice given to you by your health care provider. Make sure you discuss any questions you have with your health care provider.   Document Released: 10/12/2005 Document Revised: 01/04/2012 Document Reviewed: 03/11/2011 Elsevier Interactive Patient Education 2016 [employer].  Hypokalemia Hypokalemia means that the amount of potassium in the blood is lower than normal.Potassium is a chemical, called an electrolyte, that helps regulate the amount of fluid in the body. It also stimulates muscle contraction and helps nerves function properly.Most of the body's potassium is inside of cells, and only a very small amount is in the blood. Because the amount in the blood is so small, minor changes can be  life-threatening. CAUSES  Antibiotics.  Diarrhea or vomiting.  Using laxatives too much, which can cause diarrhea.  Chronic kidney disease.  Water pills (diuretics).  Eating disorders (bulimia).  Low magnesium level.  Sweating a lot. SIGNS AND SYMPTOMS  Weakness.  Constipation.  Fatigue.  Muscle cramps.  Mental confusion.  Skipped heartbeats or irregular heartbeat (palpitations).  Tingling or numbness. DIAGNOSIS  Your health care provider can diagnose hypokalemia with blood tests. In addition to checking your potassium level, your health care provider may also check other lab tests. TREATMENT Hypokalemia can be treated with potassium supplements taken by mouth or adjustments in your current medicines. If your potassium level is very low, you may need to get potassium through a vein (IV) and be monitored in  the hospital. A diet high in potassium is also helpful. Foods high in potassium are:  Nuts, such as peanuts and pistachios.  Seeds, such as sunflower seeds and pumpkin seeds.  Peas, lentils, and lima beans.  Whole grain and bran cereals and breads.  Fresh fruit and vegetables, such as apricots, avocado, bananas, cantaloupe, kiwi, oranges, tomatoes, asparagus, and potatoes.  Orange and tomato juices.  Red meats.  Fruit yogurt. HOME CARE INSTRUCTIONS  Take all medicines as prescribed by your health care provider.  Maintain a healthy diet by including nutritious food, such as fruits, vegetables, nuts, whole grains, and lean meats.  If you are taking a laxative, be sure to follow the directions on the label. SEEK MEDICAL CARE IF:  Your weakness gets worse.  You feel your heart pounding or racing.  You are vomiting or having diarrhea.  You are diabetic and having trouble keeping your blood glucose in the normal range. SEEK IMMEDIATE MEDICAL CARE IF:  You have chest pain, shortness of breath, or dizziness.  You are vomiting or having diarrhea for  more than 2 days.  You faint. MAKE SURE YOU:   Understand these instructions.  Will watch your condition.  Will get help right away if you are not doing well or get worse.   This information is not intended to replace advice given to you by your health care provider. Make sure you discuss any questions you have with your health care provider.   Document Released: 10/12/2005 Document Revised: 11/02/2014 Document Reviewed: 04/14/2013 Elsevier Interactive Patient Education [employer].

## 2016-03-21 NOTE — ED Notes (Signed)
Patient went to the bathroom, but urine was not saved. Patient reminded that a urine specimen was needed.

## 2016-03-21 NOTE — ED Notes (Signed)
Patient drinking Sprite slowly. Thus far has tolerated.

## 2016-03-24 ENCOUNTER — Telehealth: Payer: Self-pay | Admitting: Gastroenterology

## 2016-03-24 NOTE — Telephone Encounter (Signed)
Patient will come in and see Dr. Fuller Plan on 03/30/16 9:45

## 2016-03-30 ENCOUNTER — Ambulatory Visit: Payer: Medicare Other | Admitting: Gastroenterology

## 2016-05-14 ENCOUNTER — Encounter: Payer: Self-pay | Admitting: Gastroenterology

## 2016-06-30 ENCOUNTER — Ambulatory Visit (INDEPENDENT_AMBULATORY_CARE_PROVIDER_SITE_OTHER): Payer: Medicare Other | Admitting: Gastroenterology

## 2016-06-30 ENCOUNTER — Encounter: Payer: Self-pay | Admitting: Gastroenterology

## 2016-06-30 VITALS — BP 110/80 | HR 108 | Ht 58.25 in | Wt 93.4 lb

## 2016-06-30 DIAGNOSIS — K219 Gastro-esophageal reflux disease without esophagitis: Secondary | ICD-10-CM | POA: Diagnosis not present

## 2016-06-30 DIAGNOSIS — R1110 Vomiting, unspecified: Secondary | ICD-10-CM | POA: Diagnosis not present

## 2016-06-30 DIAGNOSIS — R1031 Right lower quadrant pain: Secondary | ICD-10-CM

## 2016-06-30 DIAGNOSIS — R1030 Lower abdominal pain, unspecified: Secondary | ICD-10-CM

## 2016-06-30 DIAGNOSIS — R634 Abnormal weight loss: Secondary | ICD-10-CM | POA: Diagnosis not present

## 2016-06-30 MED ORDER — DICYCLOMINE HCL 10 MG PO CAPS: 10.0000 mg | ORAL_CAPSULE | Freq: Four times a day (QID) | ORAL | 11 refills | Status: DC | PRN

## 2016-06-30 MED ORDER — PROMETHAZINE HCL 25 MG PO TABS: 25.0000 mg | ORAL_TABLET | Freq: Two times a day (BID) | ORAL | 1 refills | Status: DC | PRN

## 2016-06-30 MED ORDER — OMEPRAZOLE 40 MG PO CPDR: 40.0000 mg | DELAYED_RELEASE_CAPSULE | Freq: Two times a day (BID) | ORAL | 11 refills | Status: DC

## 2016-06-30 NOTE — Progress Notes (Signed)
    History of Present Illness: This is a 60 year old female with a chronic N/V which has worsened over the past few months along with a gradual weight loss of 10-15#. She has a history of pyloric stenosis, status post pyloric balloon dilation in August 2012. Colonoscopy in 11-22-2010 showed one hyperplastic polyp and diverticulosis. She has a history of GERD, chronic R groin pain and IBS-D. She was evaluated in the ED on 5/22 with K=2.8 and a UTI. Evaluated in ED on 5/25 with symptoms of depression-mother past away 4 years ago, father passed away in 11-22-22, best friend moved this spring. Several meds were restarted. Evaluated in ED on 5/27 for N/V and K=2.9. Has been taking phenergan regularly for months and reports a 10-15# weight loss. BMET in Dr. Baldwin Crown office on 7/19 was normal, with K=3.7, except for a mildly elevated Ca=10.6. Denies constipation, change in stool caliber, melena, hematochezia, chest pain.  Current Medications, Allergies, Past Medical History, Past Surgical History, Family History and Social History were reviewed in [employer] record.  Physical Exam: General: Well developed, well nourished, no acute distress Head: Normocephalic and atraumatic Eyes:  sclerae anicteric, EOMI Ears: Normal auditory acuity Mouth: No deformity or lesions Lungs: Clear throughout to auscultation Heart: Regular rate and rhythm; no murmurs, rubs or bruits Abdomen: Soft, non tender and non distended. No masses, hepatosplenomegaly or hernias noted. Normal Bowel sounds Musculoskeletal: Symmetrical with no gross deformities  Pulses:  Normal pulses noted Extremities: No clubbing, cyanosis, edema or deformities noted Neurological: Alert oriented x 4, grossly nonfocal Psychological:  Alert and cooperative. Normal mood and affect  Assessment and Recommendations:  1. Worsening of chronic N/V and weight loss. GERD. History of pyloric stenosis. Change to omeprazole 40 mg po bid. DC  OTC Nexium. Phenergan 25 mg po bid prn. Schedule UGI series. EGD, abd/pelvic CT are likely next steps.   2. Chronic R groin pain for over 25 years near hernia repair, unchanged.   3. IBS-D stable, chronic. Continue dicyclomine 10 mg po tid prn.   4. Mild hypercalcemia. Per Dr. Forde Dandy.

## 2016-06-30 NOTE — Patient Instructions (Signed)
We have sent the following medications to your pharmacy for you to pick up at your convenience:omeprazole, bentyl and promethazine.   You have been scheduled for an Upper GI Series at [facility]. Your appointment is on 07/10/16 at 9:30am. Please arrive 15 minutes prior to your test for registration. Make sure not to eat or drink anything after midnight on the night before your test. If you need to reschedule, please call radiology at [phone]. ________________________________________________________________ An upper GI series uses x rays to help diagnose problems of the upper GI tract, which includes the esophagus, stomach, and duodenum. The duodenum is the first part of the small intestine. An upper GI series is conducted by a radiology technologist or a radiologist-a doctor who specializes in x-ray imaging-at a hospital or outpatient center. While sitting or standing in front of an x-ray machine, the patient drinks barium liquid, which is often white and has a chalky consistency and taste. The barium liquid coats the lining of the upper GI tract and makes signs of disease show up more clearly on x rays. X-ray video, called fluoroscopy, is used to view the barium liquid moving through the esophagus, stomach, and duodenum. Additional x rays and fluoroscopy are performed while the patient lies on an x-ray table. To fully coat the upper GI tract with barium liquid, the technologist or radiologist may press on the abdomen or ask the patient to change position. Patients hold still in various positions, allowing the technologist or radiologist to take x rays of the upper GI tract at different angles. If a technologist conducts the upper GI series, a radiologist will later examine the images to look for problems.  This test typically takes about 1 hour to complete. __________________________________________________________________  Thank you for choosing me and [facility].  Pricilla Riffle. Dagoberto Ligas., MD., [facility]

## 2016-07-10 ENCOUNTER — Other Ambulatory Visit: Payer: Self-pay

## 2016-07-10 ENCOUNTER — Ambulatory Visit (HOSPITAL_COMMUNITY)
Admission: RE | Admit: 2016-07-10 | Discharge: 2016-07-10 | Disposition: A | Discharge status: Home / Self Care | Payer: Medicare Other | Source: Ambulatory Visit | Attending: Gastroenterology | Admitting: Gastroenterology

## 2016-07-10 DIAGNOSIS — R1110 Vomiting, unspecified: Secondary | ICD-10-CM | POA: Insufficient documentation

## 2016-07-10 DIAGNOSIS — K219 Gastro-esophageal reflux disease without esophagitis: Secondary | ICD-10-CM | POA: Diagnosis not present

## 2016-07-10 DIAGNOSIS — R1030 Lower abdominal pain, unspecified: Secondary | ICD-10-CM | POA: Diagnosis present

## 2016-07-10 DIAGNOSIS — K311 Adult hypertrophic pyloric stenosis: Secondary | ICD-10-CM | POA: Insufficient documentation

## 2016-07-10 DIAGNOSIS — R112 Nausea with vomiting, unspecified: Secondary | ICD-10-CM

## 2016-07-10 DIAGNOSIS — R1031 Right lower quadrant pain: Secondary | ICD-10-CM

## 2016-07-10 IMAGING — RF DG UGI W/ HIGH DENSITY W/KUB
13 series · 15 of 24 positions shown · non-contrast
Comparison: Upper GI [DATE], CT [DATE]

CLINICAL DATA: Pyloric stenosis.  Vomiting.  Nausea.

EXAM:
UPPER GI SERIES WITH KUB
TECHNIQUE: After obtaining a scout radiograph a routine upper GI series was
performed using thin and high density barium
FLUOROSCOPY TIME:  Fluoroscopy Time:  2.2 minutes
Radiation Exposure Index (if provided by the fluoroscopic device):
Name 0.2 mGy
Number of Acquired Spot Images: 10

[Series 1: t abdomen supine · 0.15mm/px · 1 of 1 slices shown]
[im 1/1]
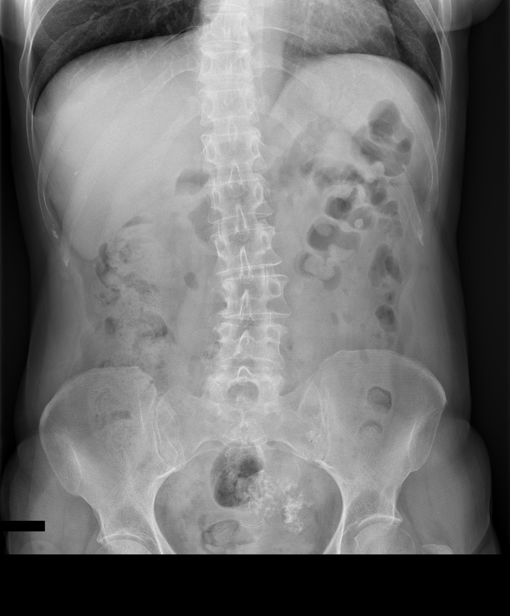

[Series 2: cp_standard · 0.56mm/px · 1 of 19 frames shown (1 of 2)]
[frame 6/19]
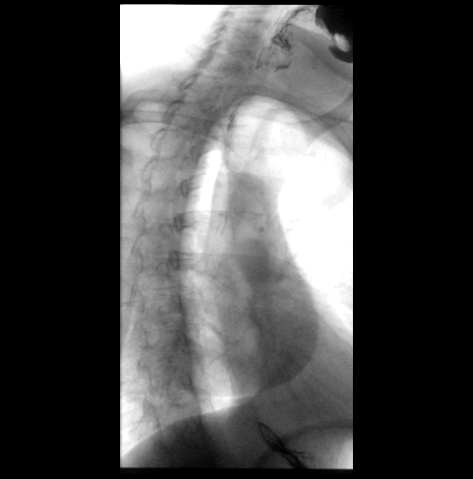

[Series 3: fluoro_barium 2fps_bw · 0.18mm/px · 1 of 1 slices shown (1 of 10)]
[im 1/1]
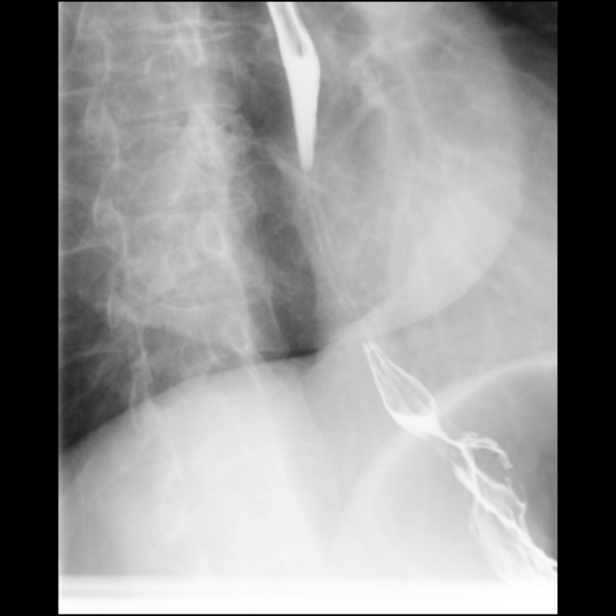

[Series 4: fluoro_barium 2fps_bw · 0.18mm/px · 1 of 3 frames shown (2 of 10)]
[frame 1/3]
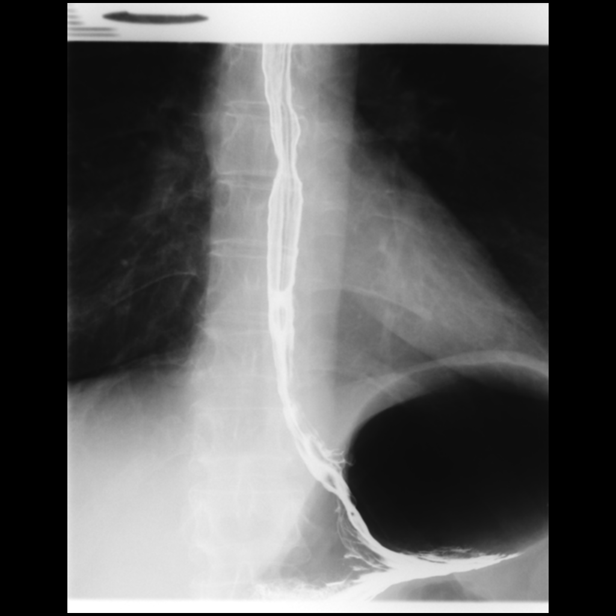

[Series 5: fluoro_barium 2fps_bw · 0.19mm/px · 2 of 3 frames shown (3 of 10)]
[frame 1/3]
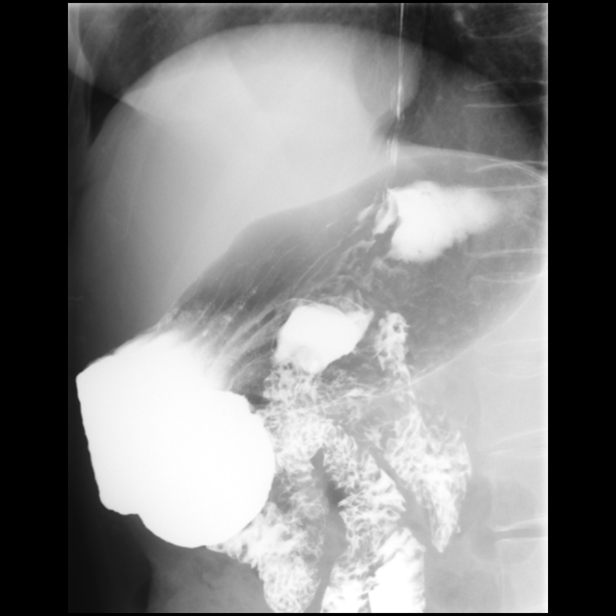
[frame 2/3]
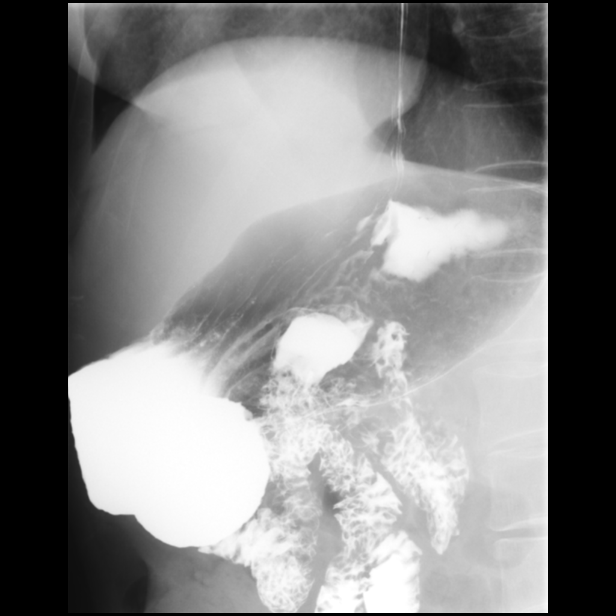

[Series 6: fluoro_barium 2fps_bw · 0.19mm/px · 1 of 2 frames shown (4 of 10)]
[frame 1/2]
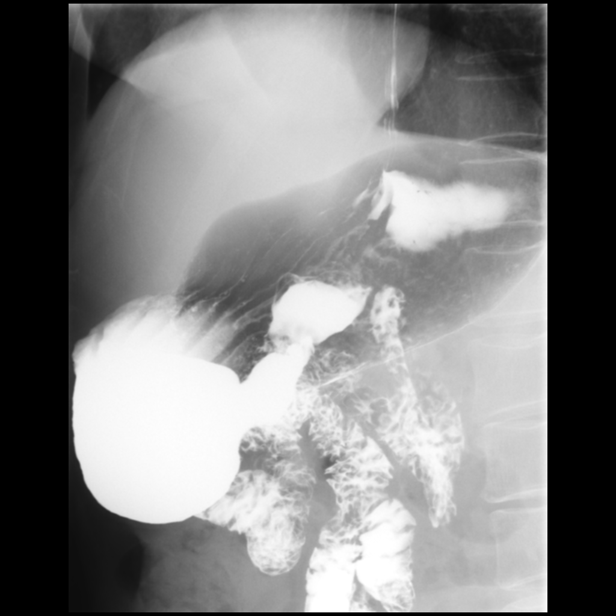

[Series 7: fluoro_barium 2fps_bw · 0.19mm/px · 2 of 4 frames shown (5 of 10)]
[frame 3/4]
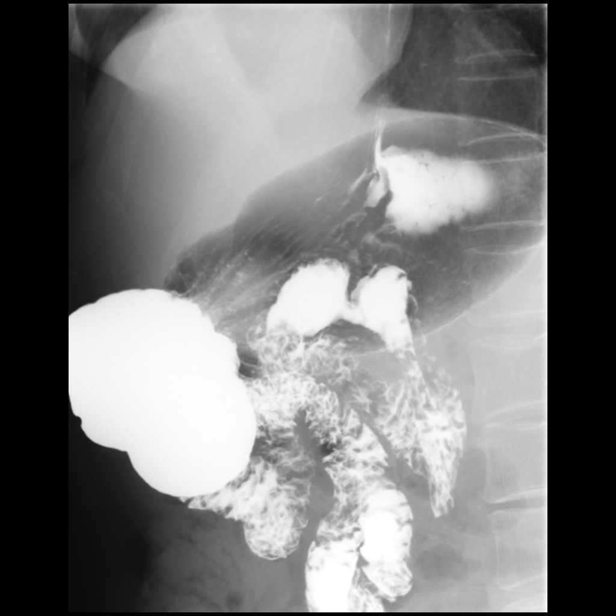
[frame 4/4]
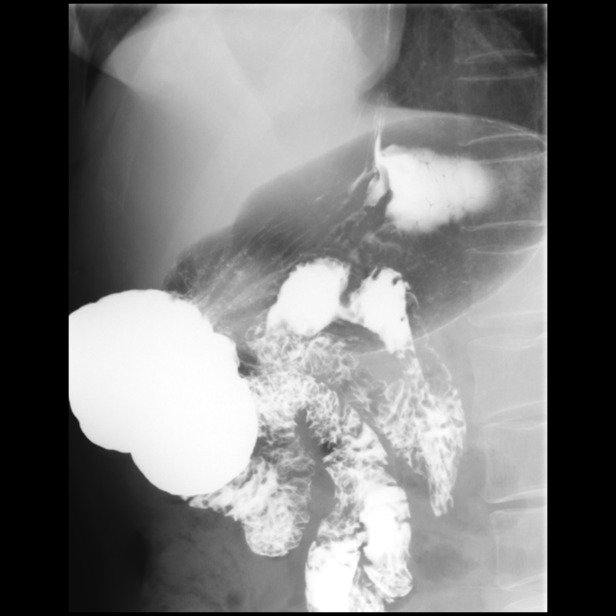

[Series 8: fluoro_barium 2fps_bw · 0.19mm/px · 1 of 2 frames shown (6 of 10)]
[frame 2/2]
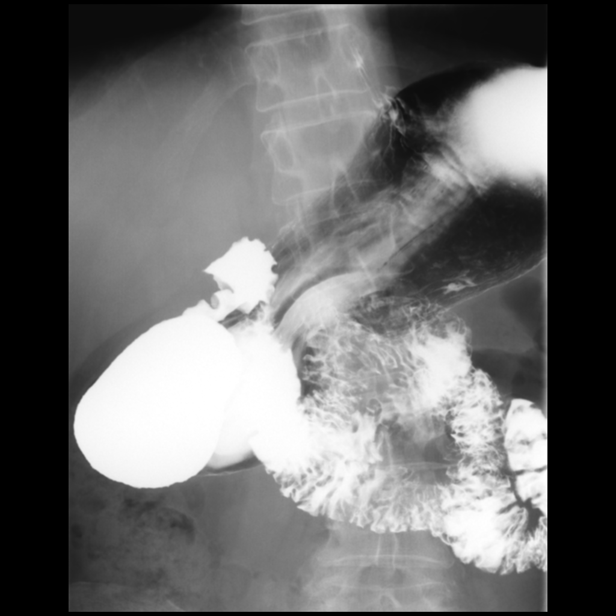

[Series 9: fluoro_barium 2fps_bw · 0.19mm/px · 1 of 2 frames shown (7 of 10)]
[frame 1/2]
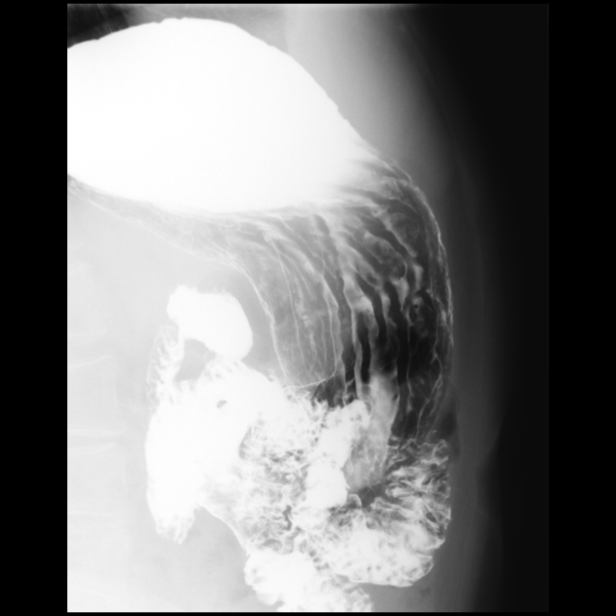

[Series 10: fluoro_barium 2fps_bw · 0.19mm/px · 1 of 3 frames shown (8 of 10)]
[frame 2/3]
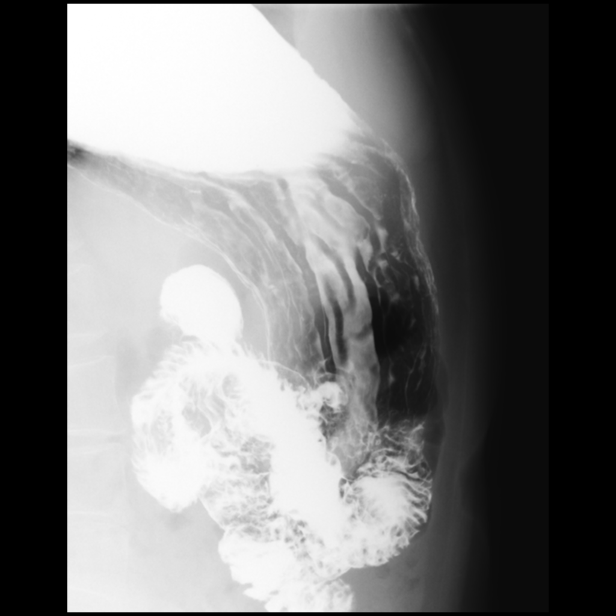

[Series 11: fluoro_barium 2fps_bw · 0.19mm/px · 1 of 2 frames shown (9 of 10)]
[frame 1/2]
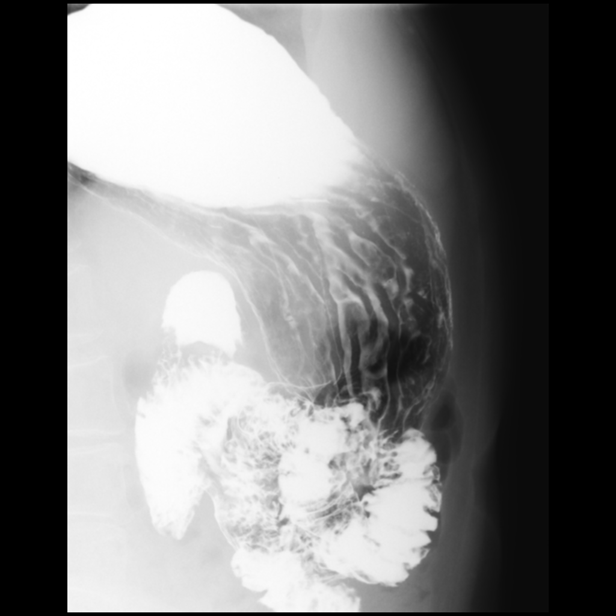

[Series 12: fluoro_barium 2fps_bw · 0.19mm/px · 1 of 2 frames shown (10 of 10)]
[frame 1/2]
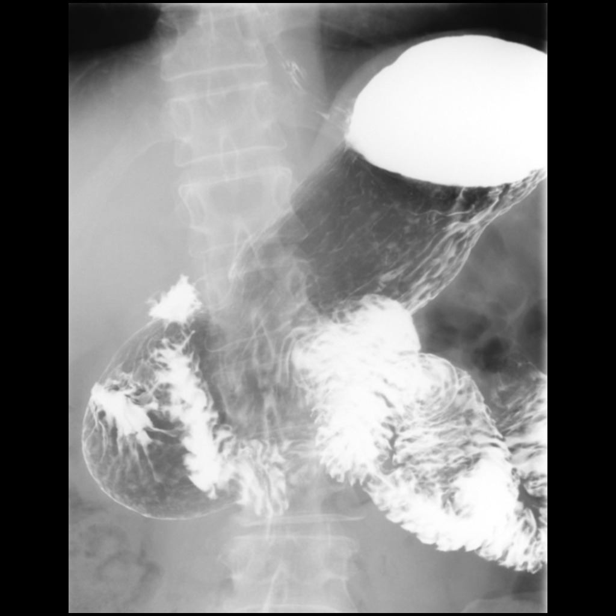

[Series 13: cp_standard · 0.19mm/px · 1 of 1 slices shown (2 of 2)]
[im 1/1]
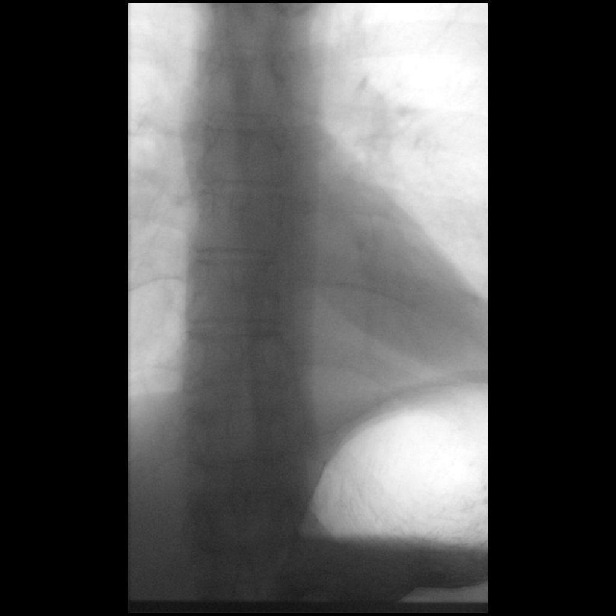

[15 of 24 positions shown; findings below may reference images not displayed]

FINDINGS: No mucosal irregularity within the esophagus. No stricture mass. No
reflux demonstrated during course this exam. A 13 mm barium tab
passed GE junction easily. Normal esophageal motility.

No mucosal irregularity within the stomach.  No stricture or mass.

There is narrowing in the pyloric region of the stomach over a
cm segment which persists for the course of the exam.

No duodenum ulceration or stricture.
IMPRESSION: 1. Again demonstrated persistent narrowing through the pyloric
region of the stomach. No evidence of obstruction.
2. Normal esophagus and stomach otherwise. No mucosal irregularity,
stricture or mass.
3. Normal esophagus motility.

## 2016-07-10 NOTE — Progress Notes (Signed)
Patient is scheduled for a GES for

## 2016-07-20 ENCOUNTER — Other Ambulatory Visit: Payer: Self-pay | Admitting: Gastroenterology

## 2016-07-29 ENCOUNTER — Other Ambulatory Visit: Payer: Self-pay | Admitting: Gastroenterology

## 2016-07-30 ENCOUNTER — Ambulatory Visit (HOSPITAL_COMMUNITY)
Admission: RE | Admit: 2016-07-30 | Discharge: 2016-07-30 | Disposition: A | Discharge status: Home / Self Care | Payer: Medicare Other | Source: Ambulatory Visit | Attending: Gastroenterology | Admitting: Gastroenterology

## 2016-07-30 DIAGNOSIS — R112 Nausea with vomiting, unspecified: Secondary | ICD-10-CM | POA: Insufficient documentation

## 2016-07-30 MED ORDER — TECHNETIUM TC 99M SULFUR COLLOID: 2.0000 | Freq: Once | INTRAVENOUS | Status: DC | PRN

## 2016-07-31 ENCOUNTER — Telehealth: Payer: Self-pay

## 2016-07-31 ENCOUNTER — Other Ambulatory Visit: Payer: Self-pay

## 2016-07-31 DIAGNOSIS — K5909 Other constipation: Secondary | ICD-10-CM

## 2016-07-31 DIAGNOSIS — R1084 Generalized abdominal pain: Secondary | ICD-10-CM

## 2016-07-31 NOTE — Telephone Encounter (Signed)
Spoke with the patient. She doesn't want to confirm the dates given at this point. She will call on Monday. She knows she doesn't want to have the CT on 08/07/16.  EGD and previsit are scheduled but not confirmed. Wait for the patient to call back on Monday.

## 2016-07-31 NOTE — Telephone Encounter (Signed)
Left a message for the patient to call.

## 2016-07-31 NOTE — Telephone Encounter (Signed)
The pt was given the normal GES result and would like to know what the next step will be, she continues to have nausea and vomiting and weight loss.  Please advise.

## 2016-07-31 NOTE — Telephone Encounter (Signed)
Schedule EGD and abd/pelvic CT Follow up with Dr. Forde Dandy regarding elevated Ca

## 2016-07-31 NOTE — Telephone Encounter (Signed)
Order for CT is in. Message to [facility] for scheduling.

## 2016-08-03 NOTE — Telephone Encounter (Signed)
Patient has confirmed the appts for CT and EGD and previsits.  She understands to come pick up her CT contrast and instructions by Thursday .

## 2016-08-04 ENCOUNTER — Telehealth: Payer: Self-pay | Admitting: Gastroenterology

## 2016-08-04 NOTE — Telephone Encounter (Signed)
Patient notified of CT instructions again.  She will call back for any additional questions or concerns

## 2016-08-06 ENCOUNTER — Encounter: Payer: Self-pay | Admitting: Gastroenterology

## 2016-08-07 ENCOUNTER — Ambulatory Visit (INDEPENDENT_AMBULATORY_CARE_PROVIDER_SITE_OTHER)
Admission: RE | Admit: 2016-08-07 | Discharge: 2016-08-07 | Disposition: A | Discharge status: Home / Self Care | Payer: Medicare Other | Source: Ambulatory Visit | Attending: Gastroenterology | Admitting: Gastroenterology

## 2016-08-07 DIAGNOSIS — K5909 Other constipation: Secondary | ICD-10-CM | POA: Diagnosis not present

## 2016-08-07 DIAGNOSIS — R1084 Generalized abdominal pain: Secondary | ICD-10-CM | POA: Diagnosis not present

## 2016-08-07 IMAGING — CT CT ABD-PELV W/ CM
2 of 5 series · 16 of 46 positions shown, 18 images · IV contrast (ISOVUE 300)
Comparison: CT the abdomen and pelvis [DATE].

CLINICAL DATA: 59-year-old female with recurrent episodes of nausea
and vomiting for several years. Symptoms recently recurred 3 weeks
ago lasting for 1 week. History of intermittent diarrhea and
constipation. 1 recent 15 pound weight loss.

EXAM:
CT ABDOMEN AND PELVIS WITH CONTRAST
TECHNIQUE: Multidetector CT imaging of the abdomen and pelvis was performed
using the standard protocol following bolus administration of
intravenous contrast.
CONTRAST:  100mL [51] IOPAMIDOL ([51]) INJECTION 61%

[Series 2: abd/pel w 5.0 i40f 2 · axial · 0.60mm/px · z∈[-394,-79]mm · 13 of 73 slices shown, 15 images]
[im 5/73  soft-tissue]
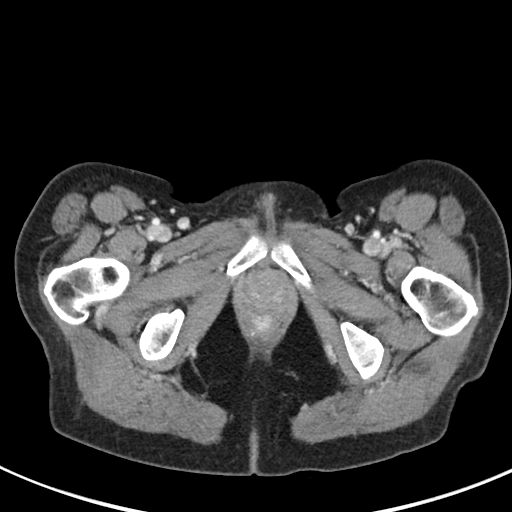
[im 5/73  bone]
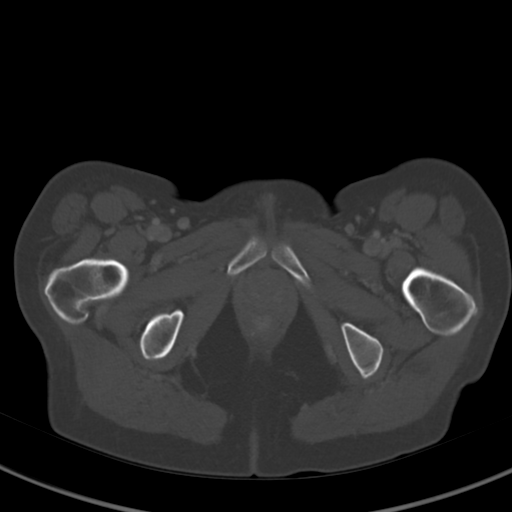
[im 9/73  soft-tissue]
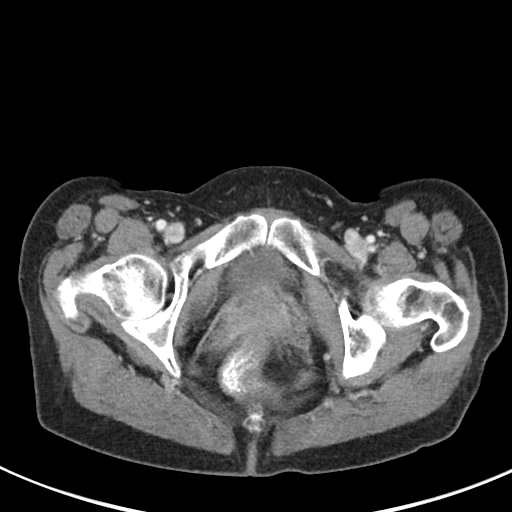
[im 17/73  soft-tissue]
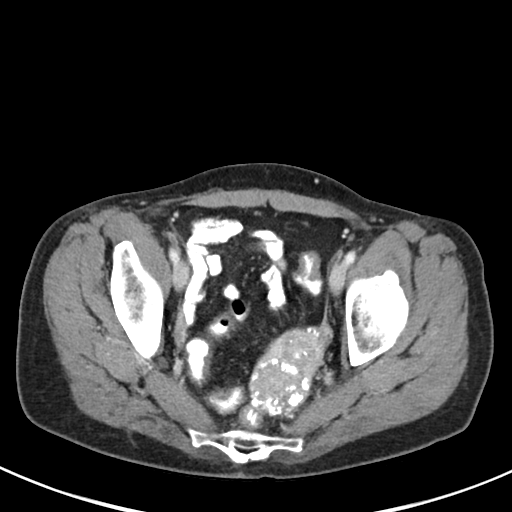
[im 22/73  soft-tissue]
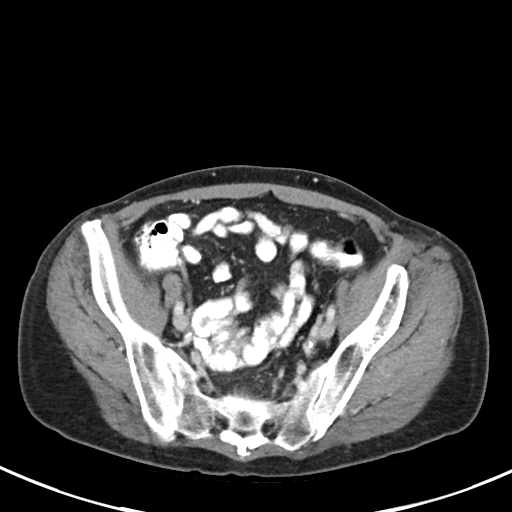
[im 26/73  soft-tissue]
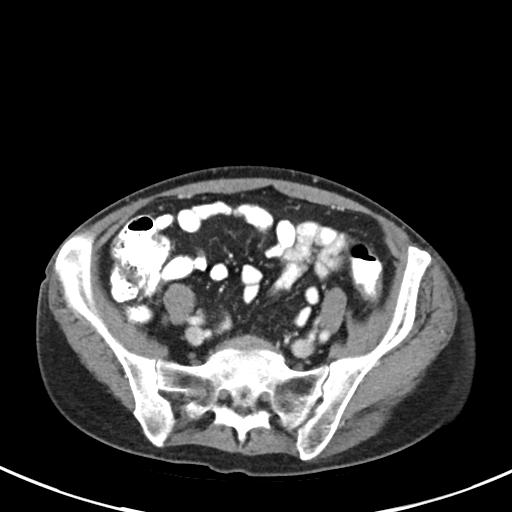
[im 30/73  soft-tissue]
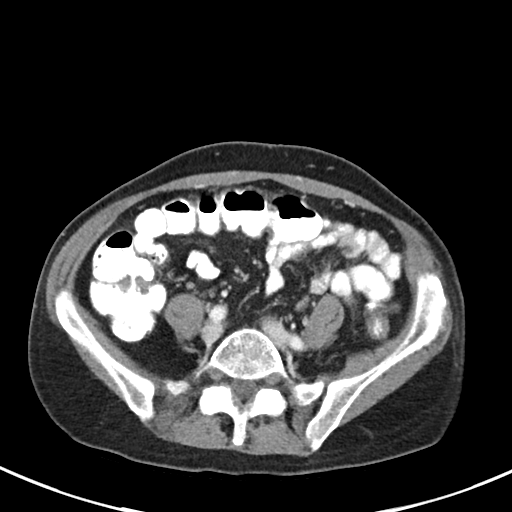
[im 39/73  soft-tissue]
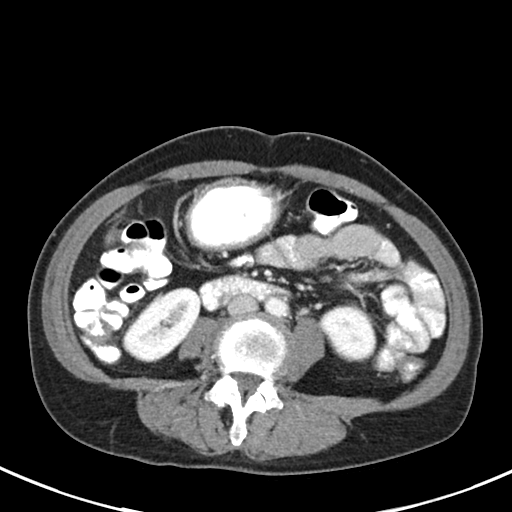
[im 43/73  soft-tissue]
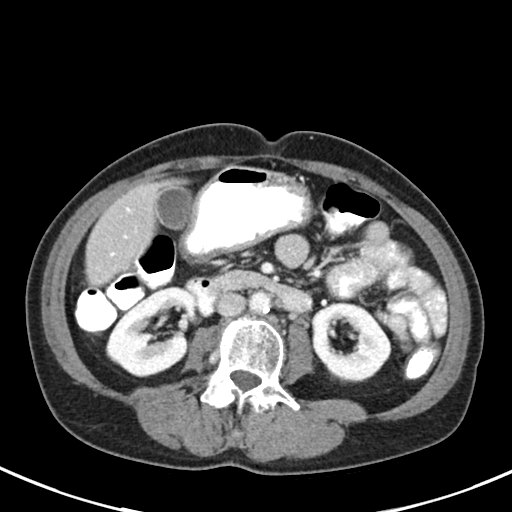
[im 47/73  soft-tissue]
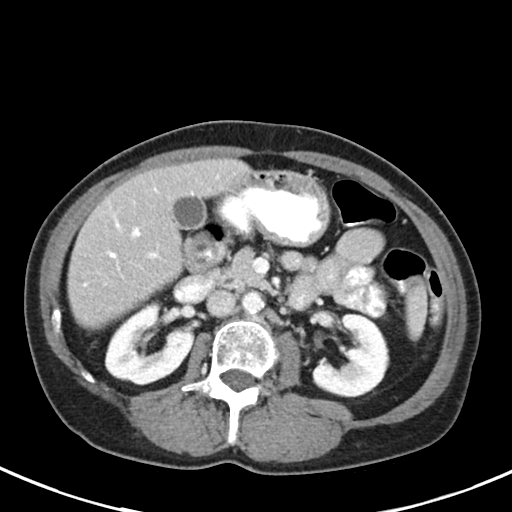
[im 47/73  bone]
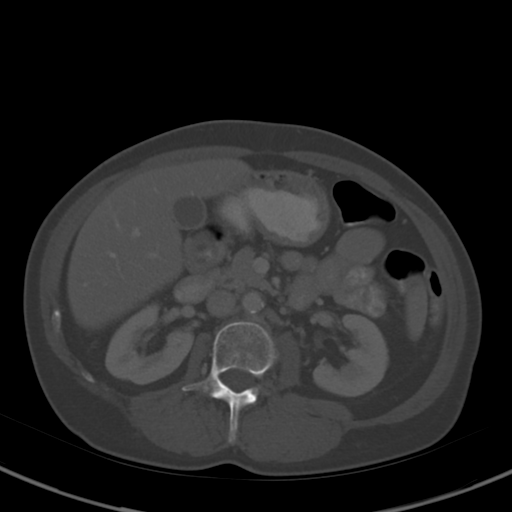
[im 51/73  soft-tissue]
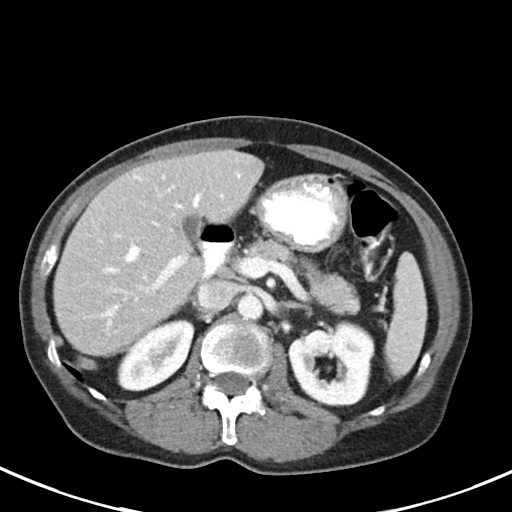
[im 56/73  soft-tissue]
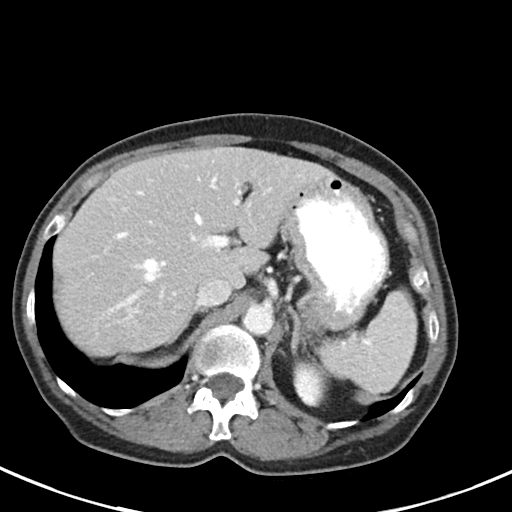
[im 64/73  soft-tissue]
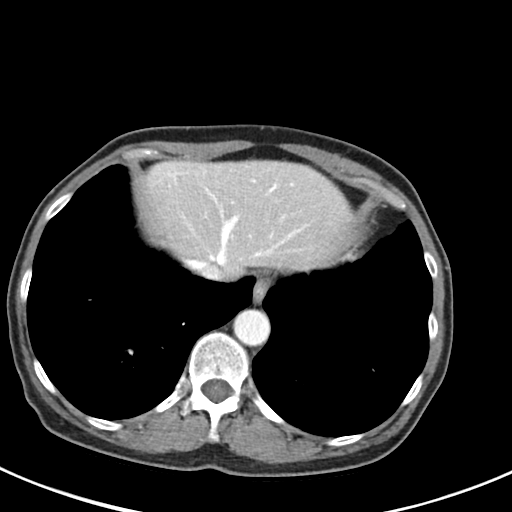
[im 68/73  soft-tissue]
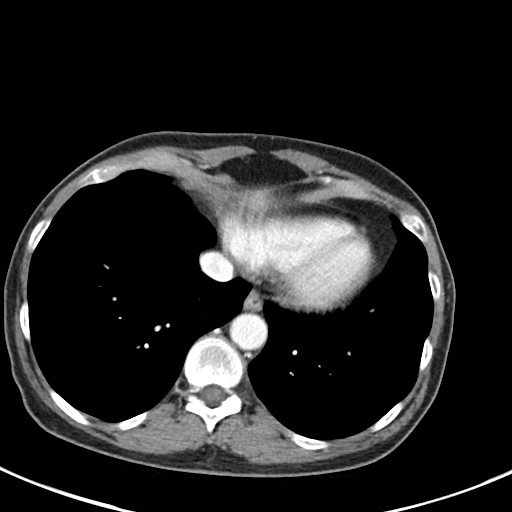

[Series 5: abd/pel w st · coronal · 0.61mm/px · 3 of 70 slices shown]
[im 24/70  soft-tissue]
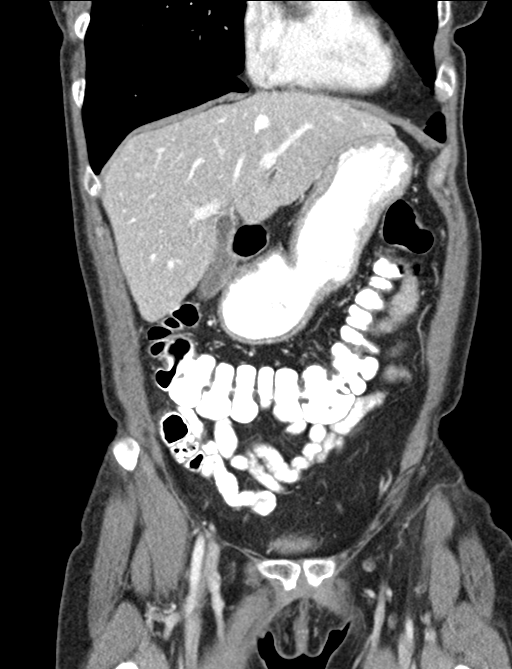
[im 31/70  soft-tissue]
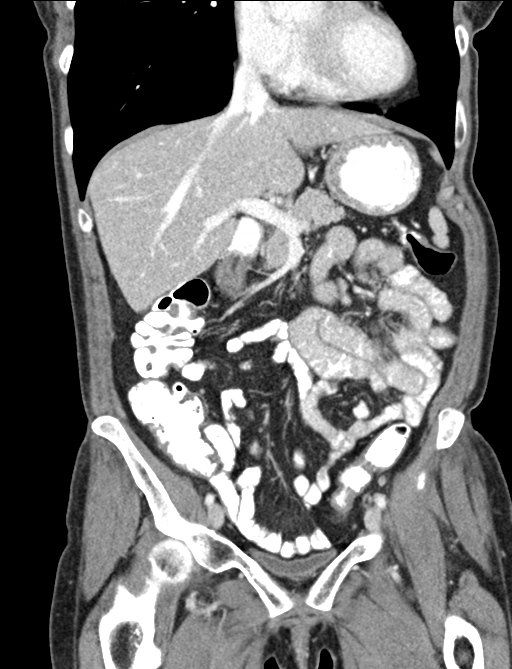
[im 39/70  soft-tissue]
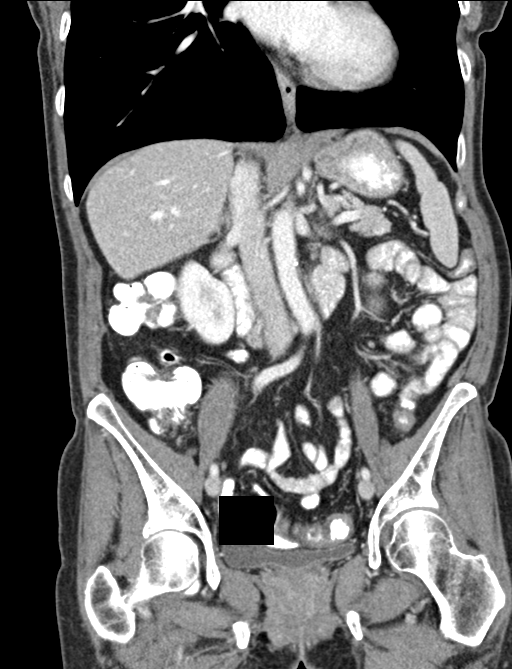

[16 of 46 positions shown; findings below may reference images not displayed]

FINDINGS: Lower chest: Unremarkable.

Hepatobiliary: No cystic or solid hepatic lesions. No intra or
extrahepatic biliary ductal dilatation. Gallbladder is normal in
appearance.

Pancreas: No pancreatic mass. No pancreatic ductal dilatation. No
pancreatic or peripancreatic fluid or inflammatory changes.

Spleen: Unremarkable.

Adrenals/Urinary Tract: Bilateral adrenal glands and bilateral
kidneys are normal in appearance. There is no hydroureteronephrosis
or perinephric stranding to indicate urinary tract obstruction at
this time. Urinary bladder is normal in appearance.

Stomach/Bowel: The appearance of the stomach is normal. No
pathologic dilatation of small bowel or colon. Normal appendix.

Vascular/Lymphatic: Aortic atherosclerosis, without evidence of
aneurysm or dissection in the abdominal or pelvic vasculature. No
lymphadenopathy noted in the abdomen or pelvis.

Reproductive: Uterus is retroverted and very heterogeneous in
appearance with multiple densely calcified lesions, largest of which
measures up to 4 cm in diameter, compatible with calcified fibroids.
Ovaries are atrophic.

Other: No significant volume of ascites.  No pneumoperitoneum.

Musculoskeletal: There are no aggressive appearing lytic or blastic
lesions noted in the visualized portions of the skeleton.
IMPRESSION: 1. No acute findings in the abdomen or pelvis to account for the
patient's symptoms.
2. Normal appendix.
3. Aortic atherosclerosis.
4. Fibroid uterus.
5. Additional incidental findings, as above.

## 2016-08-07 MED ORDER — IOPAMIDOL (ISOVUE-300) INJECTION 61%
100.0000 mL | Freq: Once | INTRAVENOUS | Status: AC | PRN
  Administered 2016-08-07: 100 mL via INTRAVENOUS

## 2016-08-10 ENCOUNTER — Encounter: Payer: Medicare Other | Admitting: Gastroenterology

## 2016-08-14 ENCOUNTER — Ambulatory Visit (AMBULATORY_SURGERY_CENTER): Payer: Self-pay

## 2016-08-14 VITALS — Ht 59.0 in | Wt 96.2 lb

## 2016-08-14 DIAGNOSIS — R1115 Cyclical vomiting syndrome unrelated to migraine: Secondary | ICD-10-CM

## 2016-08-14 DIAGNOSIS — G43A1 Cyclical vomiting, intractable: Secondary | ICD-10-CM

## 2016-08-18 ENCOUNTER — Encounter: Payer: Self-pay | Admitting: Gastroenterology

## 2016-09-01 ENCOUNTER — Encounter: Payer: Self-pay | Admitting: Gastroenterology

## 2016-09-01 ENCOUNTER — Ambulatory Visit (AMBULATORY_SURGERY_CENTER): Payer: Medicare Other | Admitting: Gastroenterology

## 2016-09-01 VITALS — BP 130/82 | HR 72 | Temp 97.5°F | Resp 14 | Ht 59.0 in | Wt 96.0 lb

## 2016-09-01 DIAGNOSIS — R1110 Vomiting, unspecified: Secondary | ICD-10-CM | POA: Diagnosis present

## 2016-09-01 DIAGNOSIS — K311 Adult hypertrophic pyloric stenosis: Secondary | ICD-10-CM | POA: Diagnosis not present

## 2016-09-01 DIAGNOSIS — R634 Abnormal weight loss: Secondary | ICD-10-CM | POA: Diagnosis not present

## 2016-09-01 MED ORDER — SODIUM CHLORIDE 0.9 % IV SOLN: 500.0000 mL | INTRAVENOUS | Status: DC

## 2016-09-01 NOTE — Patient Instructions (Addendum)
YOU HAD AN ENDOSCOPIC PROCEDURE TODAY AT [facility]:   Refer to the procedure report that was given to you for any specific questions about what was found during the examination.  If the procedure report does not answer your questions, please call your gastroenterologist to clarify.  If you requested that your care partner not be given the details of your procedure findings, then the procedure report has been included in a sealed envelope for you to review at your convenience later.  YOU SHOULD EXPECT: Some feelings of bloating in the abdomen. Passage of more gas than usual.  Walking can help get rid of the air that was put into your GI tract during the procedure and reduce the bloating.   Please Note:  You might notice some irritation and congestion in your nose or some drainage.  This is from the oxygen used during your procedure.  There is no need for concern and it should clear up in a day or so.  SYMPTOMS TO REPORT IMMEDIATELY:   Following upper endoscopy (EGD)  Vomiting of blood or coffee ground material  New chest pain or pain under the shoulder blades  Painful or persistently difficult swallowing  New shortness of breath  Fever of 100F or higher  Black, tarry-looking stools  For urgent or emergent issues, a gastroenterologist can be reached at any hour by calling [phone].   DIET: Nothing by mouth until 11:00am.  Clear liquids until 1 pm, and then you may have a soft diet for the rest of the day today.  We need to see you in the office in 1 month. The 3rd floor staff will call you.  ACTIVITY:  You should plan to take it easy for the rest of today and you should NOT DRIVE or use heavy machinery until tomorrow (because of the sedation medicines used during the test).    FOLLOW UP: Our staff will call the number listed on your records the next business day following your procedure to check on you and address any questions or concerns that you may have regarding  the information given to you following your procedure. If we do not reach you, we will leave a message.  However, if you are feeling well and you are not experiencing any problems, there is no need to return our call.  We will assume that you have returned to your regular daily activities without incident.  If any biopsies were taken you will be contacted by phone or by letter within the next 1-3 weeks.  Please call us at [phone] if you have not heard about the biopsies in 3 weeks.    SIGNATURES/CONFIDENTIALITY: You and/or your care partner have signed paperwork which will be entered into your electronic medical record.  These signatures attest to the fact that that the information above on your After Visit Summary has been reviewed and is understood.  Full responsibility of the confidentiality of this discharge information lies with you and/or your care-partner.  Take your medication 1/2 hour before breakfast every day.  STOP smoking.  Read all of the handouts given to you by your recovery room nurse today.  No aspirin, ibuprofen or aleve.

## 2016-09-01 NOTE — Progress Notes (Signed)
Called to room to assist during endoscopic procedure.  Patient ID and intended procedure confirmed with present staff. Received instructions for my participation in the procedure from the performing physician.  

## 2016-09-01 NOTE — Progress Notes (Signed)
Patient's daughter got angry when I told her to please put her phone away during discharge instructions.  She did not look at me nor her mom during instructions and yanked the papers out of my hand when we were through.  Didn't listen to a thing I said during discharge.  Went over instructions in detail with patient.  Fair feed back noted.

## 2016-09-01 NOTE — Progress Notes (Signed)
Report to PACU, RN, vss, BBS= Clear.  

## 2016-09-01 NOTE — Op Note (Signed)
[place] Patient Name: Melanie Caldwell Procedure Date: 09/01/2016 9:42 AM MRN: [id number] Endoscopist: Ladene Artist , MD Age: 60 Referring MD:  Date of Birth: [birth year] Gender: Female Account #: [id number] Procedure:                Upper GI endoscopy Indications:              Pyloric stenosis, Nausea with vomiting Medicines:                Monitored Anesthesia Care Procedure:                Pre-Anesthesia Assessment:                           - Prior to the procedure, a History and Physical                            was performed, and patient medications and                            allergies were reviewed. The patient's tolerance of                            previous anesthesia was also reviewed. The risks                            and benefits of the procedure and the sedation                            options and risks were discussed with the patient.                            All questions were answered, and informed consent                            was obtained. Prior Anticoagulants: The patient has                            taken no previous anticoagulant or antiplatelet                            agents. ASA Grade Assessment: II - A patient with                            mild systemic disease. After reviewing the risks                            and benefits, the patient was deemed in                            satisfactory condition to undergo the procedure.                           After obtaining informed consent, the endoscope was  passed under direct vision. Throughout the                            procedure, the patient's blood pressure, pulse, and                            oxygen saturations were monitored continuously. The                            Model GIF-[id number] [id number]) scope was introduced                            through the mouth, and advanced to the pylorus. The                            upper GI  endoscopy was accomplished without                            difficulty. The patient tolerated the procedure                            well. Scope In: Scope Out: Findings:                 The examined esophagus was normal.                           One non-bleeding superficial gastric ulcer with no                            stigmata of bleeding was found at the pylorus. The                            lesion was 5 mm in largest dimension.                           A benign-appearing, intrinsic moderate stenosis was                            found at the pylorus. This was non-traversed. A TTS                            dilator was passed through the scope. Dilation with                            an 8.5-9.5-10.5 mm and an 09-06-12 mm pyloric                            balloon dilator was performed. The dilation site                            was examined following endoscope reinsertion and                            showed no  change. Unable to pass the stenosis                            following dilation.                           The exam of the stomach was otherwise normal. Complications:            No immediate complications. Estimated Blood Loss:     Estimated blood loss: none. Impression:               - Normal esophagus.                           - Non-bleeding gastric pylorus ulcer with no                            stigmata of bleeding.                           - Gastric stenosis was found at the pylorus.                            Dilated.                           - No specimens collected. Recommendation:           - Patient has a contact number available for                            emergencies. The signs and symptoms of potential                            delayed complications were discussed with the                            patient. Return to normal activities tomorrow.                            Written discharge instructions were provided to the                             patient.                           - Clear liquid diet for 2 hours, then advance as                            tolerated to soft diet.                           - Continue present medications including omeprazole                            40 mg daily.                           -  Return to my office in 1 month.                           - No aspirin, ibuprofen, naproxen, or other                            non-steroidal anti-inflammatory drugs. Ladene Artist, MD 09/01/2016 10:13:15 AM This report has been signed electronically.

## 2016-09-02 ENCOUNTER — Telehealth: Payer: Self-pay | Admitting: *Deleted

## 2016-09-02 ENCOUNTER — Telehealth: Payer: Self-pay

## 2016-09-02 ENCOUNTER — Telehealth: Payer: Self-pay | Admitting: Gastroenterology

## 2016-09-02 MED ORDER — OMEPRAZOLE 40 MG PO CPDR: 40.0000 mg | DELAYED_RELEASE_CAPSULE | Freq: Every day | ORAL | 11 refills | Status: DC

## 2016-09-02 NOTE — Telephone Encounter (Signed)
Informed patient that omeprazole should be at her pharmacy from where we sent it in September. Patient verbalized understanding and will go by to pick it up. Prescription sent in again to pharmacy.

## 2016-09-02 NOTE — Telephone Encounter (Signed)
  Follow up Call-  Call back number 09/01/2016  Post procedure Call Back phone  # [phone]  Permission to leave phone message Yes  Some recent data might be hidden     [phone], number disconnected. Called home #, no answer, left message.

## 2016-09-02 NOTE — Telephone Encounter (Signed)
  Follow up Call-  Call back number 09/01/2016  Post procedure Call Back phone  # [phone]  Permission to leave phone message Yes  Some recent data might be hidden    Patient was called for follow up after her procedure on 09/01/2016. No answer at the number given for follow up phone call. A message was left on the answering machine.

## 2016-09-09 ENCOUNTER — Telehealth: Payer: Self-pay | Admitting: Gastroenterology

## 2016-09-09 MED ORDER — ONDANSETRON HCL 4 MG PO TABS: 4.0000 mg | ORAL_TABLET | Freq: Three times a day (TID) | ORAL | 1 refills | Status: DC | PRN

## 2016-09-09 NOTE — Telephone Encounter (Signed)
Ok to refill 

## 2016-09-09 NOTE — Telephone Encounter (Signed)
rx sent Patient notified 

## 2016-09-09 NOTE — Telephone Encounter (Signed)
Patient is requesting zofran for nausea and vomiting.  Ok to send in rx?

## 2016-10-05 ENCOUNTER — Ambulatory Visit: Payer: Medicare Other | Admitting: Gastroenterology

## 2016-10-23 ENCOUNTER — Other Ambulatory Visit: Payer: Self-pay | Admitting: Gastroenterology

## 2016-10-27 ENCOUNTER — Other Ambulatory Visit: Payer: Self-pay | Admitting: Gastroenterology

## 2016-12-05 ENCOUNTER — Other Ambulatory Visit: Payer: Self-pay | Admitting: Gastroenterology

## 2016-12-10 ENCOUNTER — Ambulatory Visit: Payer: Medicare Other | Admitting: Gastroenterology

## 2016-12-22 ENCOUNTER — Other Ambulatory Visit: Payer: Self-pay | Admitting: Gastroenterology

## 2017-01-01 ENCOUNTER — Other Ambulatory Visit: Payer: Self-pay | Admitting: Gastroenterology

## 2017-01-14 ENCOUNTER — Other Ambulatory Visit: Payer: Self-pay | Admitting: Gastroenterology

## 2017-02-07 ENCOUNTER — Other Ambulatory Visit: Payer: Self-pay | Admitting: Gastroenterology

## 2017-03-01 ENCOUNTER — Other Ambulatory Visit: Payer: Self-pay | Admitting: Gastroenterology

## 2017-03-31 ENCOUNTER — Ambulatory Visit: Payer: Medicare Other | Admitting: Diagnostic Neuroimaging

## 2017-04-05 ENCOUNTER — Other Ambulatory Visit: Payer: Self-pay | Admitting: Gastroenterology

## 2017-05-03 ENCOUNTER — Emergency Department (HOSPITAL_COMMUNITY): Payer: Medicare Other

## 2017-05-03 ENCOUNTER — Encounter (HOSPITAL_COMMUNITY): Payer: Self-pay

## 2017-05-03 ENCOUNTER — Emergency Department (HOSPITAL_COMMUNITY)
Admission: EM | Admit: 2017-05-03 | Discharge: 2017-05-03 | Disposition: A | Discharge status: Home / Self Care | Payer: Medicare Other | Attending: Emergency Medicine | Admitting: Emergency Medicine

## 2017-05-03 DIAGNOSIS — R109 Unspecified abdominal pain: Secondary | ICD-10-CM | POA: Insufficient documentation

## 2017-05-03 DIAGNOSIS — J45998 Other asthma: Secondary | ICD-10-CM | POA: Insufficient documentation

## 2017-05-03 DIAGNOSIS — R110 Nausea: Secondary | ICD-10-CM | POA: Diagnosis not present

## 2017-05-03 DIAGNOSIS — M5489 Other dorsalgia: Secondary | ICD-10-CM | POA: Diagnosis present

## 2017-05-03 DIAGNOSIS — Z79899 Other long term (current) drug therapy: Secondary | ICD-10-CM | POA: Diagnosis not present

## 2017-05-03 DIAGNOSIS — J449 Chronic obstructive pulmonary disease, unspecified: Secondary | ICD-10-CM | POA: Diagnosis not present

## 2017-05-03 DIAGNOSIS — F17210 Nicotine dependence, cigarettes, uncomplicated: Secondary | ICD-10-CM | POA: Diagnosis not present

## 2017-05-03 LAB — COMPREHENSIVE METABOLIC PANEL
ALT: 9 U/L — AB (ref 14–54)
AST: 17 U/L (ref 15–41)
Albumin: 4.4 g/dL (ref 3.5–5.0)
Alkaline Phosphatase: 70 U/L (ref 38–126)
Anion gap: 8 (ref 5–15)
BUN: 11 mg/dL (ref 6–20)
CHLORIDE: 99 mmol/L — AB (ref 101–111)
CO2: 35 mmol/L — AB (ref 22–32)
CREATININE: 0.84 mg/dL (ref 0.44–1.00)
Calcium: 9.8 mg/dL (ref 8.9–10.3)
GFR calc non Af Amer: >60 mL/min (ref >60)
Glucose, Bld: 95 mg/dL (ref 65–99)
POTASSIUM: 2.7 mmol/L — AB (ref 3.5–5.1)
SODIUM: 142 mmol/L (ref 135–145)
Total Bilirubin: 0.5 mg/dL (ref 0.3–1.2)
Total Protein: 7.5 g/dL (ref 6.5–8.1)

## 2017-05-03 LAB — CBC
HEMATOCRIT: 42.9 % (ref 36.0–46.0)
Hemoglobin: 14.7 g/dL (ref 12.0–15.0)
MCH: 31.5 pg (ref 26.0–34.0)
MCHC: 34.3 g/dL (ref 30.0–36.0)
MCV: 91.9 fL (ref 78.0–100.0)
PLATELETS: 239 10*3/uL (ref 150–400)
RBC: 4.67 MIL/uL (ref 3.87–5.11)
RDW: 12.8 % (ref 11.5–15.5)
WBC: 8.7 10*3/uL (ref 4.0–10.5)

## 2017-05-03 LAB — URINALYSIS, ROUTINE W REFLEX MICROSCOPIC
Bilirubin Urine: NEGATIVE
GLUCOSE, UA: NEGATIVE mg/dL
KETONES UR: NEGATIVE mg/dL
Nitrite: NEGATIVE
Protein, ur: NEGATIVE mg/dL
Specific Gravity, Urine: 1.003 — ABNORMAL LOW (ref 1.005–1.030)
pH: 7 (ref 5.0–8.0)

## 2017-05-03 LAB — LIPASE, BLOOD: LIPASE: 32 U/L (ref 11–51)

## 2017-05-03 IMAGING — CT CT RENAL STONE PROTOCOL
2 of 3 series · 16 of 46 positions shown, 18 images · non-contrast
Comparison: None chest min CT [DATE]

CLINICAL DATA: Right flank pain.  Nausea and vomiting for 1 week.

EXAM:
CT ABDOMEN AND PELVIS WITHOUT CONTRAST
TECHNIQUE: Multidetector CT imaging of the abdomen and pelvis was performed
following the standard protocol without IV contrast.

[Series 3: coronal · coronal · 0.64mm/px · 3 of 108 slices shown]
[im 36/108  soft-tissue]
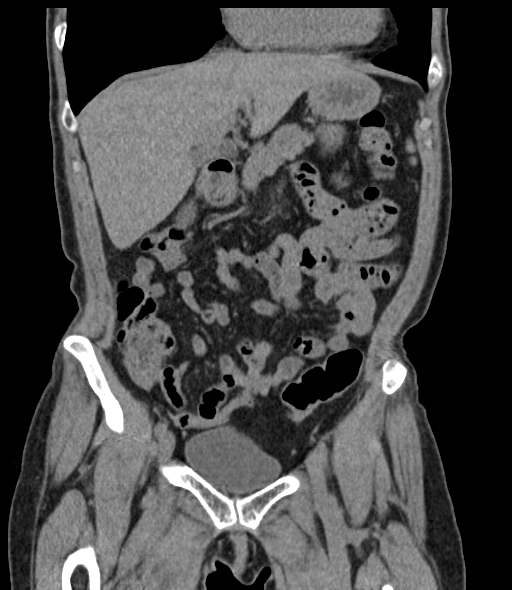
[im 48/108  soft-tissue]
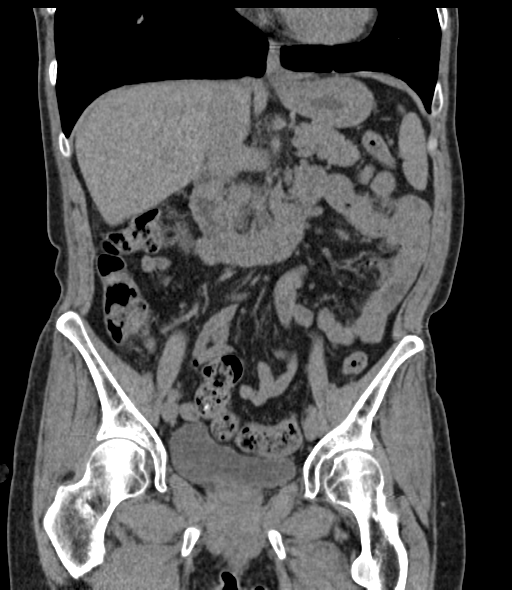
[im 60/108  soft-tissue]
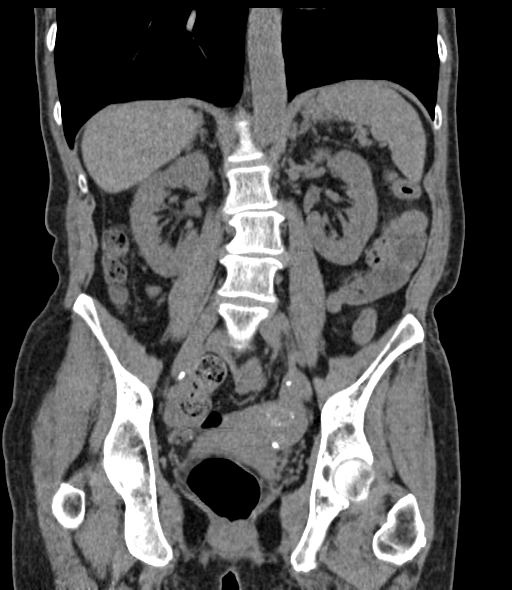

[Series 6: lung · axial · 0.60mm/px · z∈[-164,-82]mm · 13 of 49 slices shown, 15 images]
[im 4/49  soft-tissue]
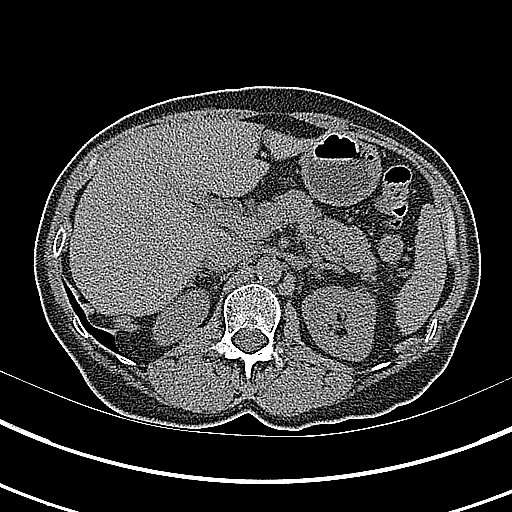
[im 4/49  bone]
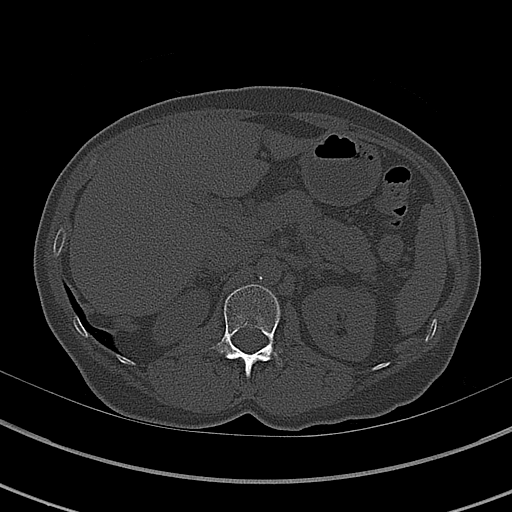
[im 7/49  soft-tissue]
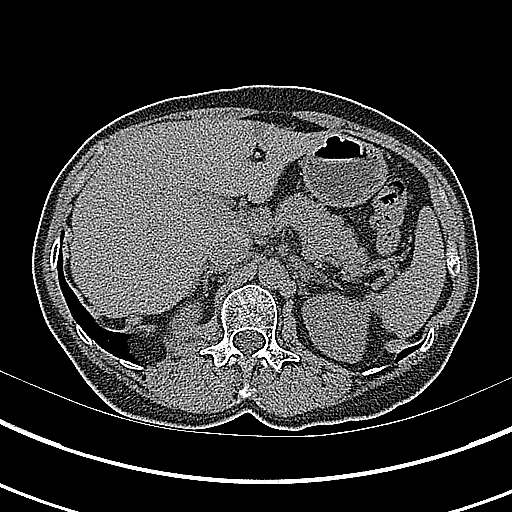
[im 10/49  soft-tissue]
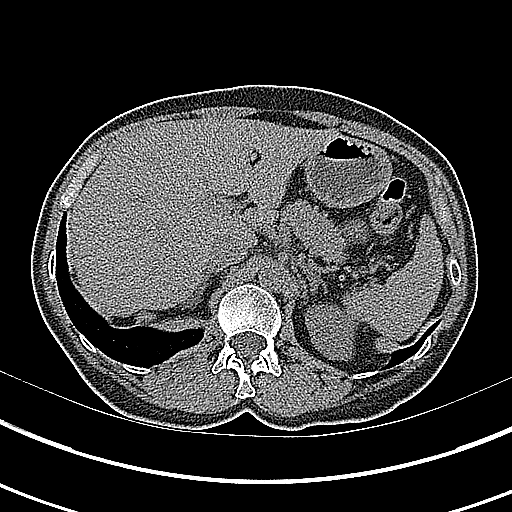
[im 14/49  soft-tissue]
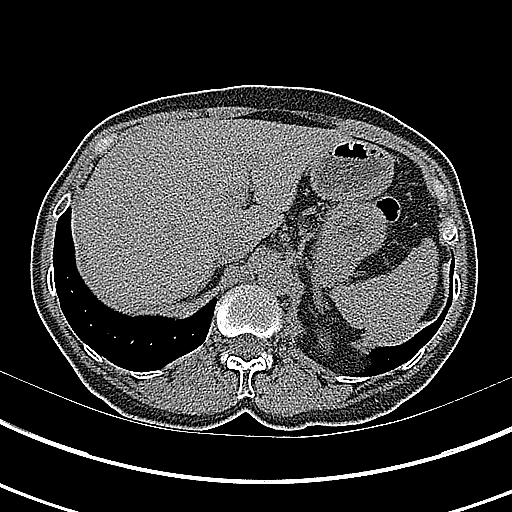
[im 18/49  soft-tissue]
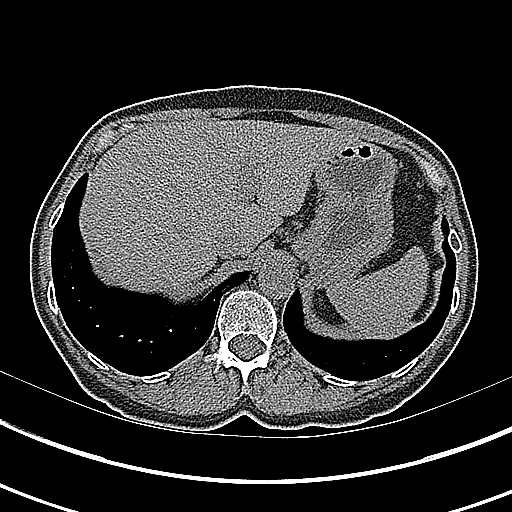
[im 21/49  soft-tissue]
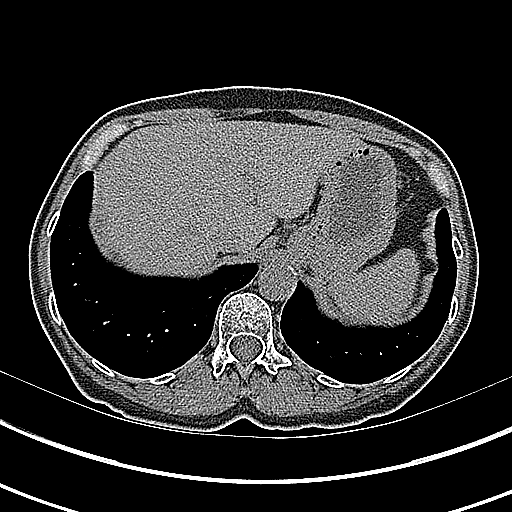
[im 25/49  soft-tissue]
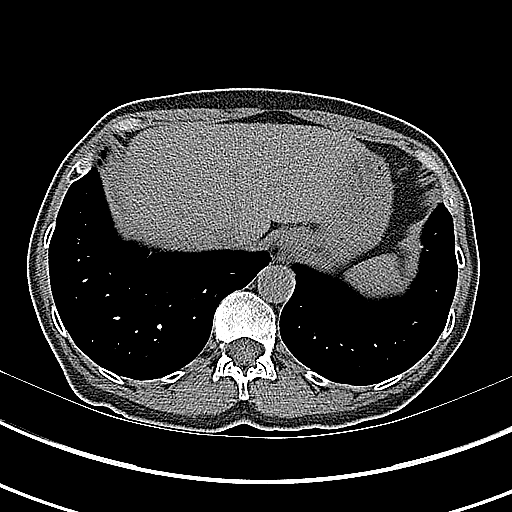
[im 28/49  soft-tissue]
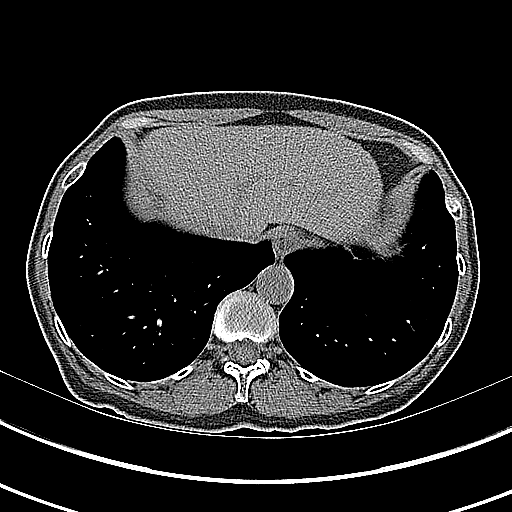
[im 31/49  soft-tissue]
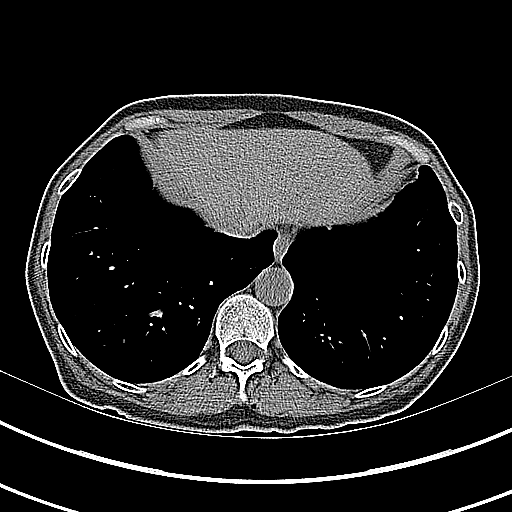
[im 31/49  bone]
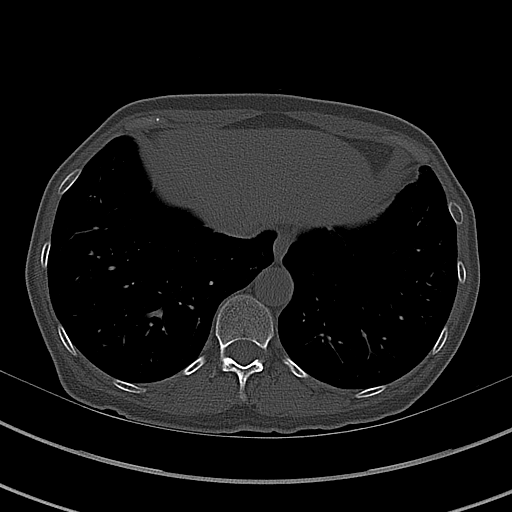
[im 35/49  soft-tissue]
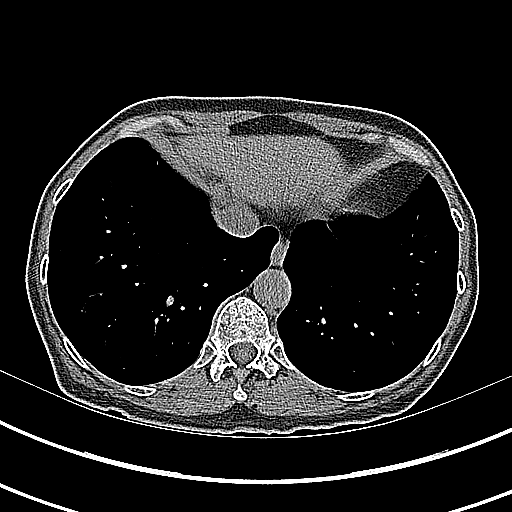
[im 39/49  soft-tissue]
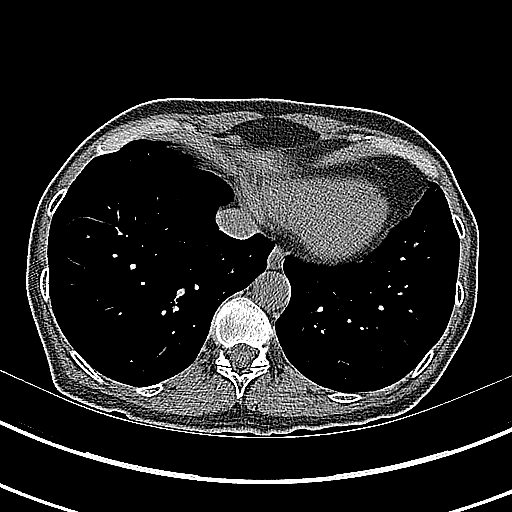
[im 42/49  soft-tissue]
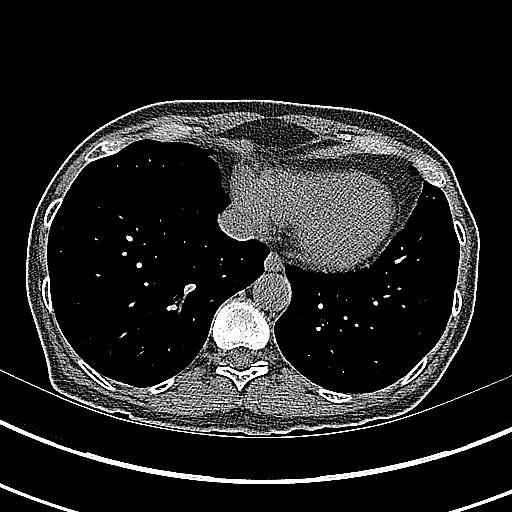
[im 45/49  soft-tissue]
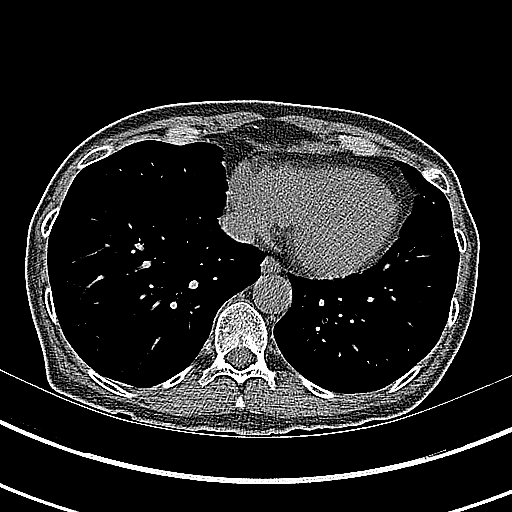

[16 of 46 positions shown; findings below may reference images not displayed]

FINDINGS: Lower chest: The lung bases are clear.

Hepatobiliary: Focal fatty infiltration adjacent to the falciform
ligament. No focal lesion allowing for lack contrast. Gallbladder
physiologically distended, no calcified stone. No biliary
dilatation.

Pancreas: No ductal dilatation or inflammation.

Spleen: Normal in size without focal abnormality.

Adrenals/Urinary Tract: No adrenal nodule. No renal stones or
hydronephrosis. No perinephric edema. Ureters are decompressed
without stones along the course. Urinary bladder is physiologically
distended, no bladder stone.

Stomach/Bowel: The stomach physiologically distended. No bowel
obstruction or inflammation. High-density material at the base of
the appendix may be an appendicolith or enteric contrast from prior
contrast-enhanced exam, no appendicitis. The appendix is normal
caliber without periappendiceal inflammation. Small to moderate
colonic stool burden without colonic wall thickening.

Vascular/Lymphatic: Mild aortic atherosclerosis.  No adenopathy.

Reproductive: No adnexal mass.

Other: No free air, free fluid, or intra-abdominal fluid collection.
Tiny fat containing umbilical hernia.

Musculoskeletal: Mild broad-based levo scoliotic curvature in
degenerative change in the spine. There are no acute or suspicious
osseous abnormalities.
IMPRESSION: 1. No renal stones or obstructive uropathy.  No acute abnormality.
2. High-density material at the base of the appendix may be an
appendicolith or enteric contrast from prior exam, the appendix is
otherwise normal. No appendicitis.
3. Aortic atherosclerosis.

## 2017-05-03 MED ORDER — ONDANSETRON HCL 4 MG/2ML IJ SOLN
4.0000 mg | Freq: Once | INTRAMUSCULAR | Status: AC
  Administered 2017-05-03: 4 mg via INTRAVENOUS
  Filled 2017-05-03: qty 2

## 2017-05-03 MED ORDER — HYDROMORPHONE HCL 1 MG/ML IJ SOLN
1.0000 mg | Freq: Once | INTRAMUSCULAR | Status: AC
  Administered 2017-05-03: 1 mg via INTRAVENOUS
  Filled 2017-05-03: qty 1

## 2017-05-03 MED ORDER — KETOROLAC TROMETHAMINE 30 MG/ML IJ SOLN
30.0000 mg | Freq: Once | INTRAMUSCULAR | Status: AC
  Administered 2017-05-03: 30 mg via INTRAVENOUS
  Filled 2017-05-03: qty 1

## 2017-05-03 MED ORDER — IBUPROFEN 600 MG PO TABS: 600.0000 mg | ORAL_TABLET | Freq: Four times a day (QID) | ORAL | 0 refills | Status: DC | PRN

## 2017-05-03 MED ORDER — ONDANSETRON 4 MG PO TBDP
4.0000 mg | ORAL_TABLET | Freq: Once | ORAL | Status: AC
  Administered 2017-05-03: 4 mg via ORAL
  Filled 2017-05-03: qty 1

## 2017-05-03 MED ORDER — POTASSIUM CHLORIDE 10 MEQ/100ML IV SOLN
10.0000 meq | Freq: Once | INTRAVENOUS | Status: AC
Start: 2017-05-03 — End: 2017-05-03
  Administered 2017-05-03: 10 meq via INTRAVENOUS
  Filled 2017-05-03: qty 100

## 2017-05-03 MED ORDER — POTASSIUM CHLORIDE ER 10 MEQ PO TBCR: 10.0000 meq | EXTENDED_RELEASE_TABLET | Freq: Every day | ORAL | 0 refills | Status: DC

## 2017-05-03 NOTE — ED Triage Notes (Signed)
Pt with back pain x 2 weeks.  Nausea emesis with radiating to front abdomen for past week.  Pt has hx of kidney stones.  No fever

## 2017-05-03 NOTE — Discharge Instructions (Signed)
Follow-up with her primary care doctor next 24-48 hours further evaluation.   Make sure you're drinking clear fluids and stay hydrated.  Take medication as directed for pain and nausea.  Take the potassium pills as directed. Have your doctor repeat potassium level when he see him for further evaluation.  Return the emergency Department for any worsening pain, persistent nausea vomiting, chest pain, difficulty breathing, fever, pain when you urinate, blood in urine or any other worsening or concerning symptoms.

## 2017-05-03 NOTE — ED Notes (Signed)
Morey Hummingbird RN made aware of Potassium 2.7.

## 2017-05-03 NOTE — ED Provider Notes (Signed)
[facility] DEPT Provider Note   CSN: [id number] Arrival date & time: 05/03/17  1232     History   Chief Complaint Chief Complaint  Patient presents with  . Back Pain  . Emesis    HPI Melanie Caldwell is a 61 y.o. female who presents with a week and a half of nausea/vomiting and intermittent right-sided flank/abdominal pain that became more constant over the last 3 days. She states that pain is worse with movement and laying on her right side. She denies any alleviating factors.Patient reports that initially when symptoms began she cut her primary care doctor who prescribed her Zofran. She reports improvement in nausea and vomiting with Zofran use but states that she has had decreased appetite. When she was vomiting she was having nonbloody nonbilious emesis. Patient also reports a few episodes of diarrhea but states that has resolved. Patient has had a history of kidney stones before that required lithotripsy. She states that usually pain with her kidney stones is more intense. She denies any fever, dysuria, hematuria.  The history is provided by the patient.    Past Medical History:  Diagnosis Date  . ADD (attention deficit disorder)   . Anxiety disorder   . Asthma   . Bulging lumbar disc   . COPD (chronic obstructive pulmonary disease) ([place])   . DDD (degenerative disc disease), lumbar   . Depression   . Fibroid uterus   . Gastric ulcer   . GERD (gastroesophageal reflux disease)   . History of nephrolithiasis   . History of UTI   . Pyloric stenosis   . Recreational drug use    History  . Tobacco abuse     Patient Active Problem List   Diagnosis Date Noted  . GERD 10/16/2010  . CHEST PAIN UNSPECIFIED 10/16/2010  . NAUSEA AND VOMITING 10/16/2010  . ABDOMINAL PAIN, EPIGASTRIC 10/16/2010  . DIARRHEA 02/12/2009  . ASTHMA 10/01/2008  . LEG PAIN, BILATERAL 10/01/2008  . TOBACCO ABUSE 08/16/2008  . SHORTNESS OF BREATH 08/16/2008  . CHEST PAIN, ATYPICAL 08/16/2008  .  RHINOSINUSITIS, ACUTE 07/25/2008  . FIBROIDS, UTERUS 07/12/2008  . CONSTIPATION 07/12/2008  . Abdominal pain, other specified site 07/06/2008    Past Surgical History:  Procedure Laterality Date  . ESOPHAGEAL DILATION    . EXPLORATION MIDDLE EAR     with revision of left stapedectomy and replacement of prosthesis  . FETAL SURGERY FOR CONGENITAL HERNIA    . HERNIA REPAIR    . LITHOTRIPSY  1985  . TONSILLECTOMY      OB History    No data available       Home Medications    Prior to Admission medications   Medication Sig Start Date End Date Taking? Authorizing Provider  citalopram (CELEXA) 20 MG tablet Take 20 mg by mouth daily.  04/22/17  Yes [provider]  gabapentin (NEURONTIN) 400 MG capsule take 1 capsule by mouth four times a day and take 3 capsules by mouth every evening 03/02/17  Yes [provider]  Ibuprofen-Diphenhydramine Cit (ADVIL PM) 200-38 MG TABS Take 1 tablet by mouth at bedtime as needed (sleep).   Yes [provider]  ondansetron (ZOFRAN-ODT) 8 MG disintegrating tablet DISSOLVE 1 TABLET ON TONGUE EVERY 8 HOURS AS NEEDED FOR NAUSEA OR VOMITING 04/29/17  Yes [provider]  traZODone (DESYREL) 50 MG tablet Take 25-50 mg by mouth QHS 04/30/17  Yes [provider]  ibuprofen (ADVIL,MOTRIN) 600 MG tablet Take 1 tablet (600 mg total)  by mouth every 6 (six) hours as needed. 05/03/17   Volanda Napoleon, PA-C  omeprazole (PRILOSEC) 40 MG capsule Take 1 capsule (40 mg total) by mouth daily. Patient not taking: Reported on 05/03/2017 09/02/16   Ladene Artist, MD  ondansetron [facility]) 4 MG tablet take 1 tablet by mouth every 8 hours if needed for nausea and vomiting Patient not taking: Reported on 05/03/2017 04/05/17   Ladene Artist, MD  potassium chloride (K-DUR) 10 MEQ tablet Take 1 tablet (10 mEq total) by mouth daily. 05/03/17   Volanda Napoleon, PA-C  promethazine (PHENERGAN) 25 MG tablet take 1 tablet by mouth twice a day if  needed for nausea and vomiting Patient not taking: Reported on 05/03/2017 01/15/17   Ladene Artist, MD    Family History Family History  Problem Relation Age of Onset  . Coronary artery disease Mother   . Hypertension Mother   . Diabetes Father   . Colon cancer Maternal Grandfather     Social History Social History  Substance Use Topics  . Smoking status: Current Every Day Smoker    Packs/day: 0.50    Types: Cigarettes  . Smokeless tobacco: Never Used     Comment: Patient given counseling sheet to quit smoking; now smoking :hookah with 1 cigarette daily....  . Alcohol use Yes     Comment: hx of heavy Etoh use in the past; now socially     Allergies   Adhesive [tape] and Ceftin [cefuroxime axetil]   Review of Systems Review of Systems  Respiratory: Negative for shortness of breath.   Cardiovascular: Negative for chest pain.  Gastrointestinal: Positive for nausea.  Genitourinary: Positive for flank pain. Negative for dysuria and hematuria.  Musculoskeletal: Negative for back pain and neck pain.     Physical Exam Updated Vital Signs BP 124/68   Pulse 64   Temp 98.8 F (37.1 C) (Oral)   Resp 18   Ht 4\' 10"  (1.473 m)   Wt 41.8 kg (92 lb 2 oz)   SpO2 98%   BMI 19.25 kg/m   Physical Exam  Constitutional: She is oriented to person, place, and time. She appears well-developed and well-nourished.  Appears uncomfortable in no acute distress  HENT:  Head: Normocephalic and atraumatic.  Mouth/Throat: Oropharynx is clear and moist and mucous membranes are normal.  Eyes: Conjunctivae, EOM and lids are normal. Pupils are equal, round, and reactive to light.  Neck: Full passive range of motion without pain.  Cardiovascular: Normal rate, regular rhythm, normal heart sounds and normal pulses.  Exam reveals no gallop and no friction rub.   No murmur heard. Pulmonary/Chest: Effort normal and breath sounds normal.  Abdominal: Soft. Normal appearance and bowel sounds are  normal. There is tenderness. There is CVA tenderness. There is no rigidity, no guarding and no tenderness at McBurney's point.  Tenderness in the right side and lateral abdomen. CVA tenderness, right greater than left.  Musculoskeletal: Normal range of motion.  Neurological: She is alert and oriented to person, place, and time.  Skin: Skin is warm and dry. Capillary refill takes less than 2 seconds.  Psychiatric: She has a normal mood and affect. Her speech is normal.  Nursing note and vitals reviewed.    ED Treatments / Results  Labs (all labs ordered are listed, but only abnormal results are displayed) Labs Reviewed  COMPREHENSIVE METABOLIC PANEL - Abnormal; Notable for the following:       Result Value   Potassium 2.7 (*)  Chloride 99 (*)    CO2 35 (*)    ALT 9 (*)    All other components within normal limits  URINALYSIS, ROUTINE W REFLEX MICROSCOPIC - Abnormal; Notable for the following:    Color, Urine STRAW (*)    Specific Gravity, Urine 1.003 (*)    Hgb urine dipstick SMALL (*)    Leukocytes, UA MODERATE (*)    Bacteria, UA FEW (*)    Squamous Epithelial / LPF 0-5 (*)    All other components within normal limits  LIPASE, BLOOD  CBC    EKG  EKG Interpretation None       Radiology No results found.  Procedures Procedures (including critical care time)  Medications Ordered in ED Medications  potassium chloride 10 mEq in 100 mL IVPB (0 mEq Intravenous Stopped 05/03/17 1809)  ondansetron (ZOFRAN) injection 4 mg (4 mg Intravenous Given 05/03/17 1710)  ketorolac (TORADOL) 30 MG/ML injection 30 mg (30 mg Intravenous Given 05/03/17 1816)  ondansetron (ZOFRAN) injection 4 mg (4 mg Intravenous Given 05/03/17 1845)  HYDROmorphone (DILAUDID) injection 1 mg (1 mg Intravenous Given 05/03/17 1845)  ondansetron (ZOFRAN-ODT) disintegrating tablet 4 mg (4 mg Oral Given 05/03/17 2026)     Initial Impression / Assessment and Plan / ED Course  I have reviewed the triage vital signs  and the nursing notes.  Pertinent labs & imaging results that were available during my care of the patient were reviewed by me and considered in my medical decision making (see chart for details).     61 year old female who presents with week and a half of right-sided flank pain and nausea/vomiting that worsened in last 3 days. Patient is afebrile, non-toxic appearing, sitting comfortably on examination table. Vital signs reviewed and stable. Consider UTI vs kidney stone vs dehydration. Also consider pyelonephritis, but low suspicion given reassuring vitals and physical exam. Initial labs ordered at triage. CBC within normal limits. Lipase within normal limits. CMP shows potassium is 2.7. Review of records show that patient has a history of low potassium and that this is consistent with previous records. Will plan to replace with IV potassium. UA shows small amount of hemoglobin and moderate leukocytes. Analgesics provided in the department. IVF given for fluid resuscitation. Will obtain CT renal stone scan.  CT scan shows no kidney stone. Does show what appears to be a fecolith at the appendix but normal visualized appendix without signs of acute appendicitis. Discusses results with patient. She reports improvement in pain and nausea after medications. Vitals reviewed and stable. Will PO challenge in the department. If patient can tolerate PO in the ED, patient can be discharged with outpatient follow-up.   Patient able to tolerate PO in the department. Reports feeling better with improvement in symptoms. Vitals reassuring. Plan to send patient home with PO potassium and symptomatic treatment for nausea. Instructed patient to follow-up with PCP in 2 days. Return precautions discussed. Patient expresses understanding and agreement to plan.      Final Clinical Impressions(s) / ED Diagnoses   Final diagnoses:  Flank pain  Nausea    New Prescriptions Discharge Medication List as of 05/03/2017  8:43  PM    START taking these medications   Details  ibuprofen (ADVIL,MOTRIN) 600 MG tablet Take 1 tablet (600 mg total) by mouth every 6 (six) hours as needed., Starting Mon 05/03/2017, Print    potassium chloride (K-DUR) 10 MEQ tablet Take 1 tablet (10 mEq total) by mouth daily., Starting Mon 05/03/2017, Print  Volanda Napoleon, PA-C 05/06/17 2337    Isla Pence, MD 05/07/17 [phone]

## 2017-05-21 ENCOUNTER — Encounter (HOSPITAL_COMMUNITY): Payer: Self-pay | Admitting: Emergency Medicine

## 2017-05-21 ENCOUNTER — Emergency Department (HOSPITAL_COMMUNITY)
Admission: EM | Admit: 2017-05-21 | Discharge: 2017-05-21 | Disposition: A | Discharge status: Home / Self Care | Payer: Medicare Other | Attending: Emergency Medicine | Admitting: Emergency Medicine

## 2017-05-21 DIAGNOSIS — F909 Attention-deficit hyperactivity disorder, unspecified type: Secondary | ICD-10-CM | POA: Insufficient documentation

## 2017-05-21 DIAGNOSIS — J449 Chronic obstructive pulmonary disease, unspecified: Secondary | ICD-10-CM | POA: Diagnosis not present

## 2017-05-21 DIAGNOSIS — J45909 Unspecified asthma, uncomplicated: Secondary | ICD-10-CM | POA: Insufficient documentation

## 2017-05-21 DIAGNOSIS — F17210 Nicotine dependence, cigarettes, uncomplicated: Secondary | ICD-10-CM | POA: Insufficient documentation

## 2017-05-21 DIAGNOSIS — Z79899 Other long term (current) drug therapy: Secondary | ICD-10-CM | POA: Insufficient documentation

## 2017-05-21 DIAGNOSIS — R112 Nausea with vomiting, unspecified: Secondary | ICD-10-CM | POA: Insufficient documentation

## 2017-05-21 LAB — URINALYSIS, ROUTINE W REFLEX MICROSCOPIC
BACTERIA UA: NONE SEEN
Bilirubin Urine: NEGATIVE
GLUCOSE, UA: NEGATIVE mg/dL
KETONES UR: NEGATIVE mg/dL
LEUKOCYTES UA: NEGATIVE
Nitrite: NEGATIVE
PROTEIN: NEGATIVE mg/dL
SQUAMOUS EPITHELIAL / LPF: NONE SEEN
Specific Gravity, Urine: 1.002 — ABNORMAL LOW (ref 1.005–1.030)
pH: 8 (ref 5.0–8.0)

## 2017-05-21 LAB — CBC
HEMATOCRIT: 44.8 % (ref 36.0–46.0)
HEMOGLOBIN: 16 g/dL — AB (ref 12.0–15.0)
MCH: 31.6 pg (ref 26.0–34.0)
MCHC: 35.7 g/dL (ref 30.0–36.0)
MCV: 88.4 fL (ref 78.0–100.0)
Platelets: 325 10*3/uL (ref 150–400)
RBC: 5.07 MIL/uL (ref 3.87–5.11)
RDW: 12.3 % (ref 11.5–15.5)
WBC: 9.6 10*3/uL (ref 4.0–10.5)

## 2017-05-21 LAB — COMPREHENSIVE METABOLIC PANEL
ALT: 11 U/L — AB (ref 14–54)
AST: 20 U/L (ref 15–41)
Albumin: 4.4 g/dL (ref 3.5–5.0)
Alkaline Phosphatase: 88 U/L (ref 38–126)
Anion gap: 13 (ref 5–15)
BUN: 7 mg/dL (ref 6–20)
CHLORIDE: 98 mmol/L — AB (ref 101–111)
CO2: 27 mmol/L (ref 22–32)
CREATININE: 0.77 mg/dL (ref 0.44–1.00)
Calcium: 10.1 mg/dL (ref 8.9–10.3)
Glucose, Bld: 125 mg/dL — ABNORMAL HIGH (ref 65–99)
POTASSIUM: 2.7 mmol/L — AB (ref 3.5–5.1)
Sodium: 138 mmol/L (ref 135–145)
Total Bilirubin: 0.2 mg/dL — ABNORMAL LOW (ref 0.3–1.2)
Total Protein: 8.3 g/dL — ABNORMAL HIGH (ref 6.5–8.1)

## 2017-05-21 LAB — LIPASE, BLOOD: LIPASE: 25 U/L (ref 11–51)

## 2017-05-21 MED ORDER — MAGNESIUM OXIDE 400 (241.3 MG) MG PO TABS
800.0000 mg | ORAL_TABLET | Freq: Once | ORAL | Status: AC
  Administered 2017-05-21: 800 mg via ORAL
  Filled 2017-05-21: qty 2

## 2017-05-21 MED ORDER — POTASSIUM CHLORIDE CRYS ER 20 MEQ PO TBCR
40.0000 meq | EXTENDED_RELEASE_TABLET | Freq: Once | ORAL | Status: AC
Start: 2017-05-21 — End: 2017-05-21
  Administered 2017-05-21: 40 meq via ORAL
  Filled 2017-05-21: qty 2

## 2017-05-21 MED ORDER — ONDANSETRON HCL 4 MG/2ML IJ SOLN
4.0000 mg | Freq: Once | INTRAMUSCULAR | Status: AC
  Administered 2017-05-21: 4 mg via INTRAVENOUS
  Filled 2017-05-21: qty 2

## 2017-05-21 MED ORDER — ONDANSETRON 8 MG PO TBDP: ORAL_TABLET | ORAL | 0 refills | Status: DC

## 2017-05-21 MED ORDER — ACETAMINOPHEN 500 MG PO TABS
1000.0000 mg | ORAL_TABLET | Freq: Once | ORAL | Status: AC
  Administered 2017-05-21: 1000 mg via ORAL
  Filled 2017-05-21: qty 2

## 2017-05-21 MED ORDER — SODIUM CHLORIDE 0.9 % IV BOLUS (SEPSIS)
1000.0000 mL | Freq: Once | INTRAVENOUS | Status: AC
  Administered 2017-05-21: 1000 mL via INTRAVENOUS

## 2017-05-21 NOTE — Discharge Instructions (Signed)
Follow up with your PCP.  Return for inability to eat or drink, sudden worsening abdominal pain.

## 2017-05-21 NOTE — ED Triage Notes (Signed)
Pt reports emesis for the past 2 weeks. Was on zofran for this, but ran out. Pt reports she has been up all night throwing up.

## 2017-05-21 NOTE — ED Notes (Signed)
Pt c/o nausea, Dr. Tyrone Nine notified.

## 2017-05-21 NOTE — ED Notes (Signed)
Pt tolerated oral intake well.  

## 2017-05-21 NOTE — ED Provider Notes (Signed)
[place] DEPT Provider Note   CSN: [id number] Arrival date & time: 05/21/17  1155     History   Chief Complaint Chief Complaint  Patient presents with  . Emesis    HPI Melanie Caldwell is a 61 y.o. female.  61 yo F with a cc of nausea and vomiting. Chronic issue for her, has seen Dr. Fuller Plan in the office from GI.  On chronic zofran.  Patient with worsening over past week.  Mild crampy abdominal pain.  Denies fevers, chest pain, sob.    The history is provided by the patient.  Emesis   This is a new problem. The current episode started more than 2 days ago. The problem occurs 5 to 10 times per day. The problem has been gradually worsening. There has been no fever. Pertinent negatives include no arthralgias, no chills, no fever, no headaches and no myalgias.  Illness  This is a chronic problem. The current episode started more than 1 week ago. The problem occurs constantly. The problem has been gradually worsening. Pertinent negatives include no chest pain, no headaches and no shortness of breath. Nothing aggravates the symptoms. Nothing relieves the symptoms. She has tried nothing for the symptoms. The treatment provided no relief.    Past Medical History:  Diagnosis Date  . ADD (attention deficit disorder)   . Anxiety disorder   . Asthma   . Bulging lumbar disc   . COPD (chronic obstructive pulmonary disease) ([place])   . DDD (degenerative disc disease), lumbar   . Depression   . Fibroid uterus   . Gastric ulcer   . GERD (gastroesophageal reflux disease)   . History of nephrolithiasis   . History of UTI   . Pyloric stenosis   . Recreational drug use    History  . Tobacco abuse     Patient Active Problem List   Diagnosis Date Noted  . GERD 10/16/2010  . CHEST PAIN UNSPECIFIED 10/16/2010  . NAUSEA AND VOMITING 10/16/2010  . ABDOMINAL PAIN, EPIGASTRIC 10/16/2010  . DIARRHEA 02/12/2009  . ASTHMA 10/01/2008  . LEG PAIN, BILATERAL 10/01/2008  . TOBACCO ABUSE  08/16/2008  . SHORTNESS OF BREATH 08/16/2008  . CHEST PAIN, ATYPICAL 08/16/2008  . RHINOSINUSITIS, ACUTE 07/25/2008  . FIBROIDS, UTERUS 07/12/2008  . CONSTIPATION 07/12/2008  . Abdominal pain, other specified site 07/06/2008    Past Surgical History:  Procedure Laterality Date  . ESOPHAGEAL DILATION    . EXPLORATION MIDDLE EAR     with revision of left stapedectomy and replacement of prosthesis  . FETAL SURGERY FOR CONGENITAL HERNIA    . HERNIA REPAIR    . LITHOTRIPSY  1985  . TONSILLECTOMY      OB History    No data available       Home Medications    Prior to Admission medications   Medication Sig Start Date End Date Taking? Authorizing Provider  citalopram (CELEXA) 20 MG tablet Take 20 mg by mouth daily.  04/22/17  Yes [provider]  DimenhyDRINATE (DRAMAMINE PO) Take 1 tablet by mouth 2 (two) times daily as needed (upset stomach).   Yes [provider]  Ibuprofen-Diphenhydramine Cit (ADVIL PM) 200-38 MG TABS Take 1 tablet by mouth at bedtime as needed (sleep).   Yes [provider]  traZODone (DESYREL) 50 MG tablet Take 25-50 mg by mouth QHS 04/30/17  Yes [provider]  gabapentin (NEURONTIN) 400 MG capsule take 1 capsule by mouth four times a day and take 3 capsules  by mouth every evening 03/02/17   [provider]  ibuprofen (ADVIL,MOTRIN) 600 MG tablet Take 1 tablet (600 mg total) by mouth every 6 (six) hours as needed. Patient not taking: Reported on 05/21/2017 05/03/17   Volanda Napoleon, PA-C  omeprazole (PRILOSEC) 40 MG capsule Take 1 capsule (40 mg total) by mouth daily. Patient not taking: Reported on 05/03/2017 09/02/16   Ladene Artist, MD  ondansetron [facility]) 4 MG tablet take 1 tablet by mouth every 8 hours if needed for nausea and vomiting Patient not taking: Reported on 05/03/2017 04/05/17   Ladene Artist, MD  ondansetron (ZOFRAN-ODT) 8 MG disintegrating tablet 4mg  ODT q4 hours prn nausea/vomit 05/21/17   Deno Etienne,  DO  potassium chloride (K-DUR) 10 MEQ tablet Take 1 tablet (10 mEq total) by mouth daily. Patient not taking: Reported on 05/21/2017 05/03/17   Volanda Napoleon, PA-C  promethazine (PHENERGAN) 25 MG tablet take 1 tablet by mouth twice a day if needed for nausea and vomiting Patient not taking: Reported on 05/03/2017 01/15/17   Ladene Artist, MD    Family History Family History  Problem Relation Age of Onset  . Coronary artery disease Mother   . Hypertension Mother   . Diabetes Father   . Colon cancer Maternal Grandfather     Social History Social History  Substance Use Topics  . Smoking status: Current Every Day Smoker    Packs/day: 0.50    Types: Cigarettes  . Smokeless tobacco: Never Used     Comment: Patient given counseling sheet to quit smoking; now smoking :hookah with 1 cigarette daily....  . Alcohol use Yes     Comment: hx of heavy Etoh use in the past; now socially     Allergies   Adhesive [tape] and Ceftin [cefuroxime axetil]   Review of Systems Review of Systems  Constitutional: Negative for chills and fever.  HENT: Negative for congestion and rhinorrhea.   Eyes: Negative for redness and visual disturbance.  Respiratory: Negative for shortness of breath and wheezing.   Cardiovascular: Negative for chest pain and palpitations.  Gastrointestinal: Positive for nausea and vomiting.  Genitourinary: Negative for dysuria and urgency.  Musculoskeletal: Negative for arthralgias and myalgias.  Skin: Negative for pallor and wound.  Neurological: Negative for dizziness and headaches.     Physical Exam Updated Vital Signs BP (!) 147/95   Pulse 83   Temp 99 F (37.2 C) (Oral)   Resp 18   SpO2 96%   Physical Exam  Constitutional: She is oriented to person, place, and time. She appears well-developed and well-nourished. No distress.  HENT:  Head: Normocephalic and atraumatic.  Eyes: Pupils are equal, round, and reactive to light. EOM are normal.  Neck: Normal  range of motion. Neck supple.  Cardiovascular: Normal rate and regular rhythm.  Exam reveals no gallop and no friction rub.   No murmur heard. Pulmonary/Chest: Effort normal. She has no wheezes. She has no rales.  Abdominal: Soft. She exhibits no distension and no mass. There is no tenderness. There is no guarding.  Musculoskeletal: She exhibits no edema or tenderness.  Neurological: She is alert and oriented to person, place, and time.  Skin: Skin is warm and dry. She is not diaphoretic.  Psychiatric: She has a normal mood and affect. Her behavior is normal.  Nursing note and vitals reviewed.    ED Treatments / Results  Labs (all labs ordered are listed, but only abnormal results are displayed) Labs Reviewed  COMPREHENSIVE  METABOLIC PANEL - Abnormal; Notable for the following:       Result Value   Potassium 2.7 (*)    Chloride 98 (*)    Glucose, Bld 125 (*)    Total Protein 8.3 (*)    ALT 11 (*)    Total Bilirubin 0.2 (*)    All other components within normal limits  CBC - Abnormal; Notable for the following:    Hemoglobin 16.0 (*)    All other components within normal limits  URINALYSIS, ROUTINE W REFLEX MICROSCOPIC - Abnormal; Notable for the following:    Color, Urine STRAW (*)    Specific Gravity, Urine 1.002 (*)    Hgb urine dipstick SMALL (*)    All other components within normal limits  LIPASE, BLOOD    EKG  EKG Interpretation  Date/Time:  Friday May 21 2017 13:00:39 EDT Ventricular Rate:  81 PR Interval:    QRS Duration: 101 QT Interval:  423 QTC Calculation: 491 R Axis:   -84 Text Interpretation:  Age not entered, assumed to be  61 years old for purpose of ECG interpretation Sinus rhythm Right atrial enlargement Incomplete RBBB and LAFB Abnormal R-wave progression, late transition Borderline prolonged QT interval No significant change since last tracing Confirmed by Deno Etienne [phone]) on 05/21/2017 1:38:53 PM Also confirmed by Deno Etienne [phone]), editor  Hattie Perch ([id number])  on 05/21/2017 1:41:12 PM       Radiology No results found.  Procedures Procedures (including critical care time)  Medications Ordered in ED Medications  ondansetron (ZOFRAN) injection 4 mg (not administered)  acetaminophen (TYLENOL) tablet 1,000 mg (not administered)  sodium chloride 0.9 % bolus 1,000 mL (0 mLs Intravenous Stopped 05/21/17 1425)  ondansetron (ZOFRAN) injection 4 mg (4 mg Intravenous Given 05/21/17 1254)  potassium chloride SA (K-DUR,KLOR-CON) CR tablet 40 mEq (40 mEq Oral Given 05/21/17 1342)  magnesium oxide (MAG-OX) tablet 800 mg (800 mg Oral Given 05/21/17 1352)     Initial Impression / Assessment and Plan / ED Course  I have reviewed the triage vital signs and the nursing notes.  Pertinent labs & imaging results that were available during my care of the patient were reviewed by me and considered in my medical decision making (see chart for details).     61 yo F With a chief complaint of nausea and vomiting. This been going on for a few days. This is a chronic issue for her. Since she had run out of her home Zofran. She had been having trouble tolerating by mouth at home. Has not had anything to eat for the past couple weeks.  Well appearing and non toxic, will give fluids, zofran, oral trial.   Eating food in the ED.  Labs with mild hypokalemia.  Given K.    Upon hearing she was discharged had a sudden recurrence of nausea.  Given zofran.   3:14 PM:  I have discussed the diagnosis/risks/treatment options with the patient and family and believe the pt to be eligible for discharge home to follow-up with GI. We also discussed returning to the ED immediately if new or worsening sx occur. We discussed the sx which are most concerning (e.g., sudden worsening pain, fever, inability to tolerate by mouth) that necessitate immediate return. Medications administered to the patient during their visit and any new prescriptions provided to the  patient are listed below.  Medications given during this visit Medications  ondansetron (ZOFRAN) injection 4 mg (not administered)  acetaminophen (TYLENOL) tablet 1,000 mg (not  administered)  sodium chloride 0.9 % bolus 1,000 mL (0 mLs Intravenous Stopped 05/21/17 1425)  ondansetron (ZOFRAN) injection 4 mg (4 mg Intravenous Given 05/21/17 1254)  potassium chloride SA (K-DUR,KLOR-CON) CR tablet 40 mEq (40 mEq Oral Given 05/21/17 1342)  magnesium oxide (MAG-OX) tablet 800 mg (800 mg Oral Given 05/21/17 1352)     The patient appears reasonably screen and/or stabilized for discharge and I doubt any other medical condition or other [facility] requiring further screening, evaluation, or treatment in the ED at this time prior to discharge.    Final Clinical Impressions(s) / ED Diagnoses   Final diagnoses:  Nausea and vomiting in adult    New Prescriptions New Prescriptions   ONDANSETRON (ZOFRAN-ODT) 8 MG DISINTEGRATING TABLET    4mg  ODT q4 hours prn nausea/vomit     Deno Etienne, DO 05/21/17 1514

## 2018-04-19 ENCOUNTER — Encounter (HOSPITAL_COMMUNITY): Payer: Self-pay | Admitting: Emergency Medicine

## 2018-04-19 ENCOUNTER — Emergency Department (HOSPITAL_COMMUNITY)
Admission: EM | Admit: 2018-04-19 | Discharge: 2018-04-19 | Disposition: A | Discharge status: Home / Self Care | Payer: Medicare Other | Attending: Emergency Medicine | Admitting: Emergency Medicine

## 2018-04-19 DIAGNOSIS — Z79899 Other long term (current) drug therapy: Secondary | ICD-10-CM | POA: Diagnosis not present

## 2018-04-19 DIAGNOSIS — F329 Major depressive disorder, single episode, unspecified: Secondary | ICD-10-CM | POA: Insufficient documentation

## 2018-04-19 DIAGNOSIS — J45909 Unspecified asthma, uncomplicated: Secondary | ICD-10-CM | POA: Insufficient documentation

## 2018-04-19 DIAGNOSIS — F4321 Adjustment disorder with depressed mood: Secondary | ICD-10-CM | POA: Diagnosis present

## 2018-04-19 DIAGNOSIS — F17210 Nicotine dependence, cigarettes, uncomplicated: Secondary | ICD-10-CM | POA: Insufficient documentation

## 2018-04-19 DIAGNOSIS — F419 Anxiety disorder, unspecified: Secondary | ICD-10-CM | POA: Insufficient documentation

## 2018-04-19 DIAGNOSIS — R630 Anorexia: Secondary | ICD-10-CM | POA: Insufficient documentation

## 2018-04-19 DIAGNOSIS — F410 Panic disorder [episodic paroxysmal anxiety] without agoraphobia: Secondary | ICD-10-CM

## 2018-04-19 DIAGNOSIS — R002 Palpitations: Secondary | ICD-10-CM

## 2018-04-19 LAB — CBC WITH DIFFERENTIAL/PLATELET
BASOS ABS: 0.1 10*3/uL (ref 0.0–0.1)
Basophils Relative: 1 %
EOS PCT: 0 %
Eosinophils Absolute: 0 10*3/uL (ref 0.0–0.7)
HCT: 46.6 % — ABNORMAL HIGH (ref 36.0–46.0)
Hemoglobin: 15.8 g/dL — ABNORMAL HIGH (ref 12.0–15.0)
Lymphocytes Relative: 31 %
Lymphs Abs: 3.2 10*3/uL (ref 0.7–4.0)
MCH: 31.4 pg (ref 26.0–34.0)
MCHC: 33.9 g/dL (ref 30.0–36.0)
MCV: 92.6 fL (ref 78.0–100.0)
Monocytes Absolute: 0.6 10*3/uL (ref 0.1–1.0)
Monocytes Relative: 6 %
NEUTROS ABS: 6.3 10*3/uL (ref 1.7–7.7)
NEUTROS PCT: 62 %
PLATELETS: 251 10*3/uL (ref 150–400)
RBC: 5.03 MIL/uL (ref 3.87–5.11)
RDW: 12.8 % (ref 11.5–15.5)
WBC: 10.3 10*3/uL (ref 4.0–10.5)

## 2018-04-19 LAB — COMPREHENSIVE METABOLIC PANEL
ALT: 15 U/L (ref 0–44)
ANION GAP: 9 (ref 5–15)
AST: 20 U/L (ref 15–41)
Albumin: 4.7 g/dL (ref 3.5–5.0)
Alkaline Phosphatase: 73 U/L (ref 38–126)
BUN: 11 mg/dL (ref 8–23)
CHLORIDE: 105 mmol/L (ref 98–111)
CO2: 27 mmol/L (ref 22–32)
Calcium: 9.9 mg/dL (ref 8.9–10.3)
Creatinine, Ser: 0.78 mg/dL (ref 0.44–1.00)
GFR calc non Af Amer: >60 mL/min (ref >60)
Glucose, Bld: 114 mg/dL — ABNORMAL HIGH (ref 70–99)
POTASSIUM: 3.6 mmol/L (ref 3.5–5.1)
Sodium: 141 mmol/L (ref 135–145)
TOTAL PROTEIN: 8 g/dL (ref 6.5–8.1)
Total Bilirubin: 0.8 mg/dL (ref 0.3–1.2)

## 2018-04-19 LAB — RAPID URINE DRUG SCREEN, HOSP PERFORMED
AMPHETAMINES: NOT DETECTED
BENZODIAZEPINES: NOT DETECTED
Cocaine: NOT DETECTED
OPIATES: NOT DETECTED
Tetrahydrocannabinol: NOT DETECTED

## 2018-04-19 LAB — TROPONIN I: Troponin I: <0.03 ng/mL (ref <0.03)

## 2018-04-19 LAB — URINALYSIS, ROUTINE W REFLEX MICROSCOPIC
BILIRUBIN URINE: NEGATIVE
GLUCOSE, UA: NEGATIVE mg/dL
Ketones, ur: NEGATIVE mg/dL
Nitrite: NEGATIVE
PH: 6 (ref 5.0–8.0)
Protein, ur: NEGATIVE mg/dL
SPECIFIC GRAVITY, URINE: 1.006 (ref 1.005–1.030)

## 2018-04-19 LAB — ETHANOL: Alcohol, Ethyl (B): <10 mg/dL (ref <10)

## 2018-04-19 NOTE — ED Provider Notes (Addendum)
[facility] DEPT Provider Note   CSN: [id number] Arrival date & time: 04/19/18  1210     History   Chief Complaint Chief Complaint  Patient presents with  . Anxiety    HPI Melanie Caldwell is a 62 y.o. female.  62 year old female presents with increased anxiety and palpitations just prior to arrival.  Denies any suicidal homicidal ideations.  States that she has had anorexia as well as just generalized depression.  Denies any auditory or visual hallucinations.  Denies responding to internal stimuli.  Does have a history of anxiety disorder as well as depression and has been combined with her medications.  States that the palpitations occurred today at rest and she began to feel very anxious.  Denies any anginal chest pain.  Denies any skipping of heartbeats.  No syncope or near syncope.  Symptoms improved when she is able to calm down.     Past Medical History:  Diagnosis Date  . ADD (attention deficit disorder)   . Anxiety disorder   . Asthma   . Bulging lumbar disc   . COPD (chronic obstructive pulmonary disease) ([place])   . DDD (degenerative disc disease), lumbar   . Depression   . Fibroid uterus   . Gastric ulcer   . GERD (gastroesophageal reflux disease)   . History of nephrolithiasis   . History of UTI   . Pyloric stenosis   . Recreational drug use    History  . Tobacco abuse     Patient Active Problem List   Diagnosis Date Noted  . GERD 10/16/2010  . CHEST PAIN UNSPECIFIED 10/16/2010  . NAUSEA AND VOMITING 10/16/2010  . ABDOMINAL PAIN, EPIGASTRIC 10/16/2010  . DIARRHEA 02/12/2009  . ASTHMA 10/01/2008  . LEG PAIN, BILATERAL 10/01/2008  . TOBACCO ABUSE 08/16/2008  . SHORTNESS OF BREATH 08/16/2008  . CHEST PAIN, ATYPICAL 08/16/2008  . RHINOSINUSITIS, ACUTE 07/25/2008  . FIBROIDS, UTERUS 07/12/2008  . CONSTIPATION 07/12/2008  . Abdominal pain, other specified site 07/06/2008    Past Surgical History:  Procedure Laterality  Date  . ESOPHAGEAL DILATION    . EXPLORATION MIDDLE EAR     with revision of left stapedectomy and replacement of prosthesis  . FETAL SURGERY FOR CONGENITAL HERNIA    . HERNIA REPAIR    . LITHOTRIPSY  1985  . TONSILLECTOMY       OB History   None      Home Medications    Prior to Admission medications   Medication Sig Start Date End Date Taking? Authorizing Provider  citalopram (CELEXA) 20 MG tablet Take 20 mg by mouth daily.  04/22/17   [provider]  DimenhyDRINATE (DRAMAMINE PO) Take 1 tablet by mouth 2 (two) times daily as needed (upset stomach).    [provider]  gabapentin (NEURONTIN) 400 MG capsule take 1 capsule by mouth four times a day and take 3 capsules by mouth every evening 03/02/17   [provider]  ibuprofen (ADVIL,MOTRIN) 600 MG tablet Take 1 tablet (600 mg total) by mouth every 6 (six) hours as needed. Patient not taking: Reported on 05/21/2017 05/03/17   Providence Lanius A, PA-C  Ibuprofen-Diphenhydramine Cit (ADVIL PM) 200-38 MG TABS Take 1 tablet by mouth at bedtime as needed (sleep).    [provider]  omeprazole (PRILOSEC) 40 MG capsule Take 1 capsule (40 mg total) by mouth daily. Patient not taking: Reported on 05/03/2017 09/02/16   Ladene Artist, MD  ondansetron (ZOFRAN) 4 MG tablet  take 1 tablet by mouth every 8 hours if needed for nausea and vomiting Patient not taking: Reported on 05/03/2017 04/05/17   Ladene Artist, MD  ondansetron (ZOFRAN-ODT) 8 MG disintegrating tablet 4mg  ODT q4 hours prn nausea/vomit 05/21/17   Deno Etienne, DO  potassium chloride (K-DUR) 10 MEQ tablet Take 1 tablet (10 mEq total) by mouth daily. Patient not taking: Reported on 05/21/2017 05/03/17   Volanda Napoleon, PA-C  promethazine (PHENERGAN) 25 MG tablet take 1 tablet by mouth twice a day if needed for nausea and vomiting Patient not taking: Reported on 05/03/2017 01/15/17   Ladene Artist, MD  traZODone (DESYREL) 50 MG tablet Take 25-50 mg by  mouth QHS 04/30/17   [provider]    Family History Family History  Problem Relation Age of Onset  . Coronary artery disease Mother   . Hypertension Mother   . Diabetes Father   . Colon cancer Maternal Grandfather     Social History Social History   Tobacco Use  . Smoking status: Current Every Day Smoker    Packs/day: 0.50    Types: Cigarettes  . Smokeless tobacco: Never Used  . Tobacco comment: Patient given counseling sheet to quit smoking; now smoking :hookah with 1 cigarette daily....  Substance Use Topics  . Alcohol use: Yes    Comment: hx of heavy Etoh use in the past; now socially  . Drug use: No     Allergies   Adhesive [tape] and Ceftin [cefuroxime axetil]   Review of Systems Review of Systems  All other systems reviewed and are negative.    Physical Exam Updated Vital Signs BP (!) 141/89 (BP Location: Left Arm)   Pulse (!) 106   Temp 98 F (36.7 C) (Oral)   Resp 16   SpO2 100%   Physical Exam  Constitutional: She is oriented to person, place, and time. She appears well-developed and well-nourished.  Non-toxic appearance. No distress.  HENT:  Head: Normocephalic and atraumatic.  Eyes: Pupils are equal, round, and reactive to light. Conjunctivae, EOM and lids are normal.  Neck: Normal range of motion. Neck supple. No tracheal deviation present. No thyroid mass present.  Cardiovascular: Normal rate, regular rhythm and normal heart sounds. Exam reveals no gallop.  No murmur heard. Pulmonary/Chest: Effort normal and breath sounds normal. No stridor. No respiratory distress. She has no decreased breath sounds. She has no wheezes. She has no rhonchi. She has no rales.  Abdominal: Soft. Normal appearance and bowel sounds are normal. She exhibits no distension. There is no tenderness. There is no rebound and no CVA tenderness.  Musculoskeletal: Normal range of motion. She exhibits no edema or tenderness.  Neurological: She is alert and oriented to  person, place, and time. She has normal strength. No cranial nerve deficit or sensory deficit. GCS eye subscore is 4. GCS verbal subscore is 5. GCS motor subscore is 6.  Skin: Skin is warm and dry. No abrasion and no rash noted.  Psychiatric: Her affect is blunt. Her speech is delayed. She is withdrawn.  Nursing note and vitals reviewed.    ED Treatments / Results  Labs (all labs ordered are listed, but only abnormal results are displayed) Labs Reviewed  CBC WITH DIFFERENTIAL/PLATELET  COMPREHENSIVE METABOLIC PANEL  ETHANOL  RAPID URINE DRUG SCREEN, HOSP PERFORMED  URINALYSIS, ROUTINE W REFLEX MICROSCOPIC    EKG EKG Interpretation  Date/Time:  Tuesday April 19 2018 16:39:28 EDT Ventricular Rate:  80 PR Interval:    QRS  Duration: 93 QT Interval:  492 QTC Calculation: 568 R Axis:   158 Text Interpretation:  Sinus rhythm Biatrial enlargement Right axis deviation Low voltage, extremity leads Abnormal R-wave progression, late transition Nonspecific T abnormalities, lateral leads Prolonged QT interval Baseline wander in lead(s) V6 No significant change since last tracing Confirmed by Lacretia Leigh ([id number]) on 04/19/2018 6:31:05 PM   Radiology No results found.  Procedures Procedures (including critical care time)  Medications Ordered in ED Medications - No data to display   Initial Impression / Assessment and Plan / ED Course  I have reviewed the triage vital signs and the nursing notes.  Pertinent labs & imaging results that were available during my care of the patient were reviewed by me and considered in my medical decision making (see chart for details).   Patient presented complaining of palpitations.  Her EKG is unchanged.  Troponin negative.  Suspect her symptoms are likely anxiety related and she agrees with this.  She has had similar symptoms associated with that. Patient seen by behavioral health service on and cleared for discharge.  Patient will follow with her  psychologist  Final Clinical Impressions(s) / ED Diagnoses   Final diagnoses:  None    ED Discharge Orders    None       Lacretia Leigh, MD 04/19/18 1830    Lacretia Leigh, MD 04/19/18 [phone]

## 2018-04-19 NOTE — ED Triage Notes (Signed)
Patient c/o increased anxiety and "feeling down." Reports she hasn't taken her diazepam recently. Patient tearful during triage. Denies SI/HI. Reports "I stay alone a lot and my neighbor noticed I've been down and was acting like he was going to call someone."
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
## 2018-04-19 NOTE — Discharge Instructions (Addendum)
Follow up with your psychiatrist. °

## 2018-04-19 NOTE — ED Notes (Signed)
Patient's neighbor Melanie Caldwell [phone] called to give additional information on patient. Melanie Caldwell states that "patient has started declining since March, she has been to her doctor, but won't tell them everything that's going on." States patient is "deeply depressed, her apartment is a mess and she stays in her apartment for the most part and rarely comes out. Patient's dog is not being properly taken care of, she needs some help." Melanie Caldwell also states that "patient is barely functioning and barely eating enough to survive, she had lost a significant amount of weight and when/if she eats it's a bite or two of a sandwich/cracker. States he really just wants her to get some help.  Advises staff to call if they need anything/any more information

## 2020-03-01 ENCOUNTER — Emergency Department (HOSPITAL_COMMUNITY)
Admission: EM | Admit: 2020-03-01 | Discharge: 2020-03-01 | Disposition: A | Discharge status: Home / Self Care | Payer: Medicare Other | Attending: Emergency Medicine | Admitting: Emergency Medicine

## 2020-03-01 ENCOUNTER — Encounter (HOSPITAL_COMMUNITY): Payer: Self-pay

## 2020-03-01 ENCOUNTER — Other Ambulatory Visit: Payer: Self-pay

## 2020-03-01 ENCOUNTER — Emergency Department (HOSPITAL_COMMUNITY): Payer: Medicare Other

## 2020-03-01 DIAGNOSIS — Z79899 Other long term (current) drug therapy: Secondary | ICD-10-CM | POA: Diagnosis not present

## 2020-03-01 DIAGNOSIS — S62357A Nondisplaced fracture of shaft of fifth metacarpal bone, left hand, initial encounter for closed fracture: Secondary | ICD-10-CM | POA: Diagnosis not present

## 2020-03-01 DIAGNOSIS — Y9289 Other specified places as the place of occurrence of the external cause: Secondary | ICD-10-CM | POA: Diagnosis not present

## 2020-03-01 DIAGNOSIS — S6992XA Unspecified injury of left wrist, hand and finger(s), initial encounter: Secondary | ICD-10-CM | POA: Diagnosis present

## 2020-03-01 DIAGNOSIS — J45909 Unspecified asthma, uncomplicated: Secondary | ICD-10-CM | POA: Diagnosis not present

## 2020-03-01 DIAGNOSIS — F17210 Nicotine dependence, cigarettes, uncomplicated: Secondary | ICD-10-CM | POA: Diagnosis not present

## 2020-03-01 DIAGNOSIS — X509XXA Other and unspecified overexertion or strenuous movements or postures, initial encounter: Secondary | ICD-10-CM | POA: Diagnosis not present

## 2020-03-01 DIAGNOSIS — S6292XA Unspecified fracture of left wrist and hand, initial encounter for closed fracture: Secondary | ICD-10-CM

## 2020-03-01 DIAGNOSIS — Y9389 Activity, other specified: Secondary | ICD-10-CM | POA: Diagnosis not present

## 2020-03-01 DIAGNOSIS — Y999 Unspecified external cause status: Secondary | ICD-10-CM | POA: Insufficient documentation

## 2020-03-01 IMAGING — CR DG HAND COMPLETE 3+V*L*
3 series · 3 of 3 positions shown · non-contrast
Comparison: No recent.

CLINICAL DATA: Hand injury.

EXAM:
LEFT HAND - COMPLETE 3+ VIEW

[x hand pa left]
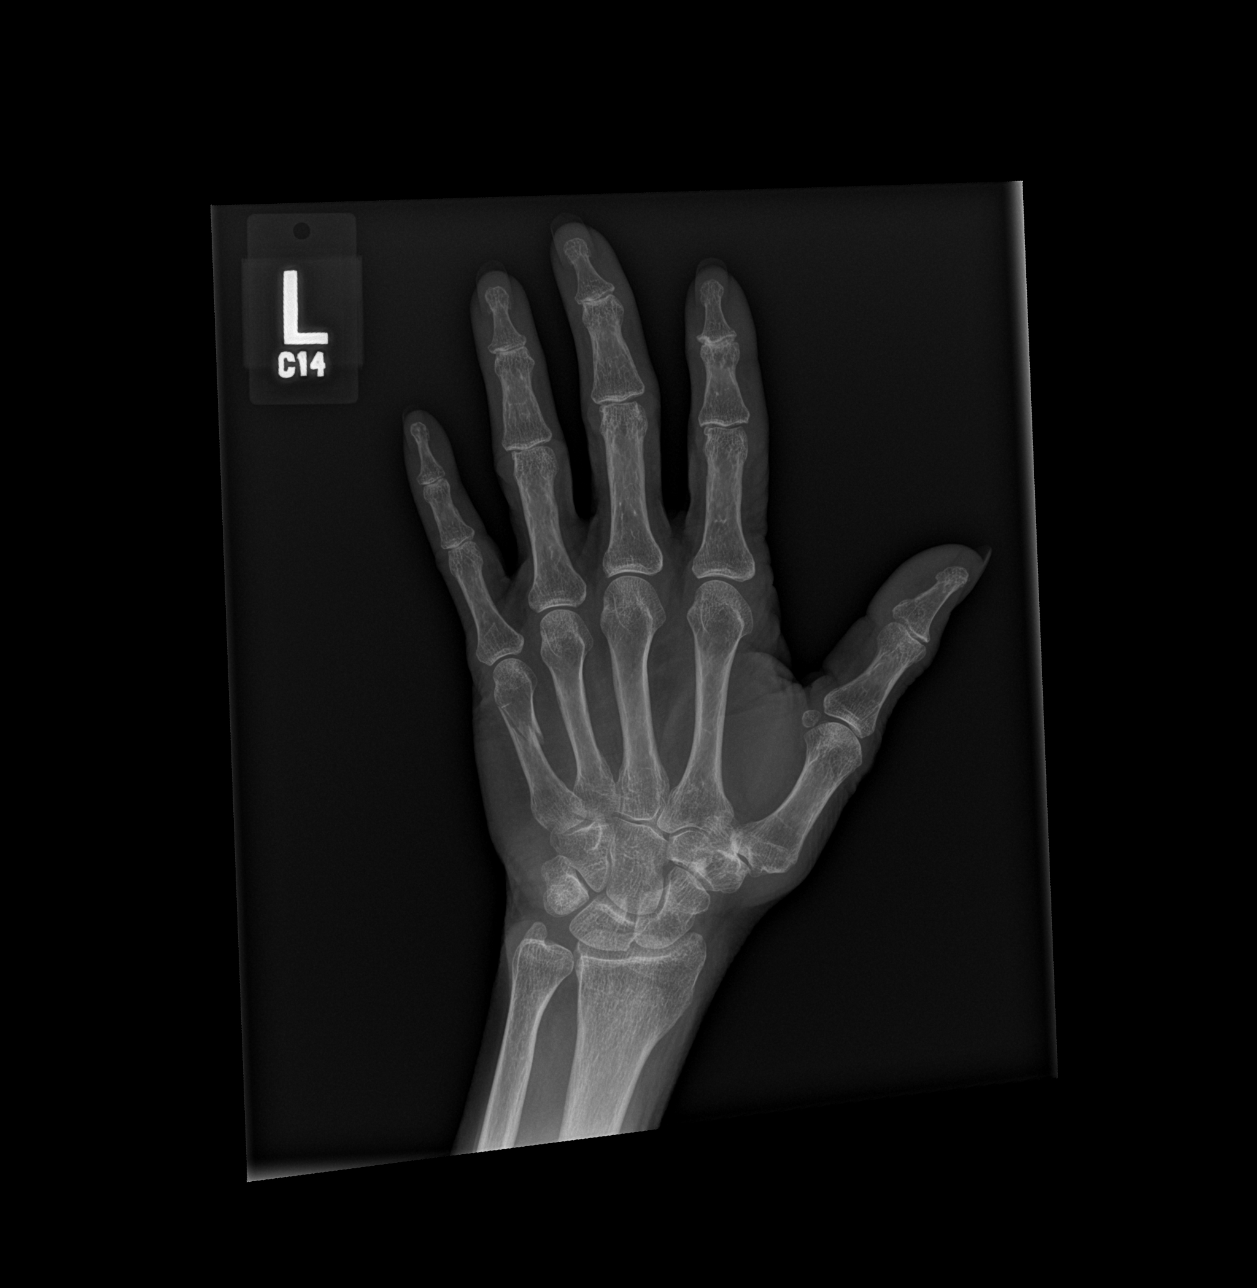

[x hand obl left]
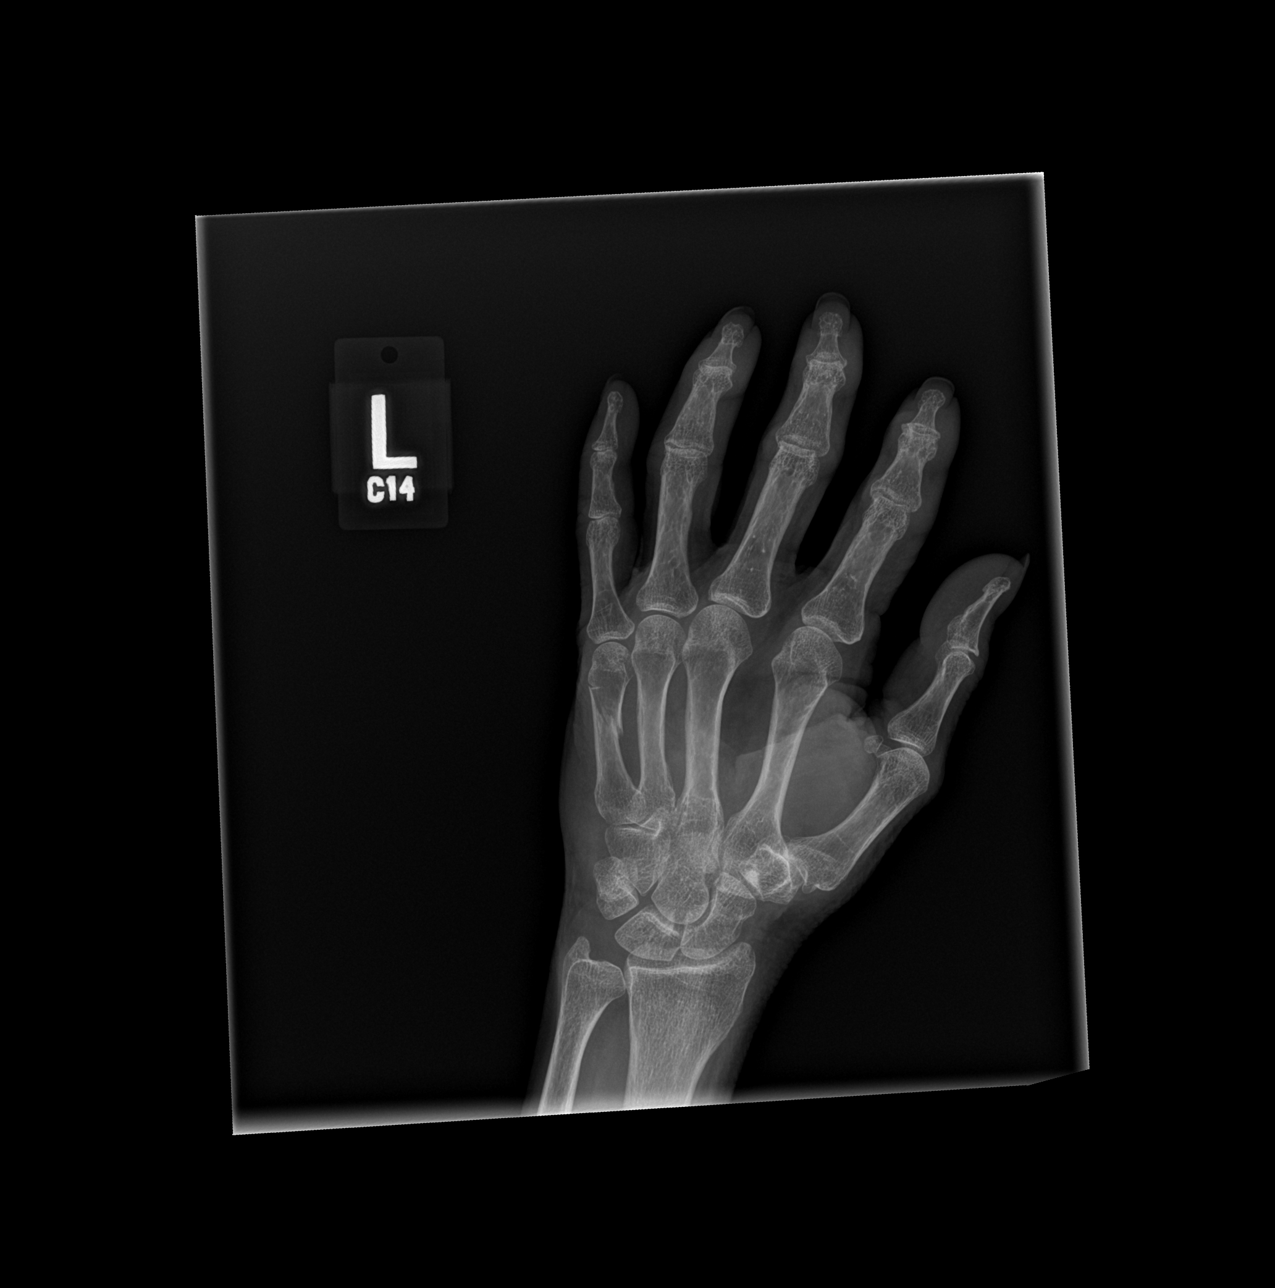

[x hand lat left]
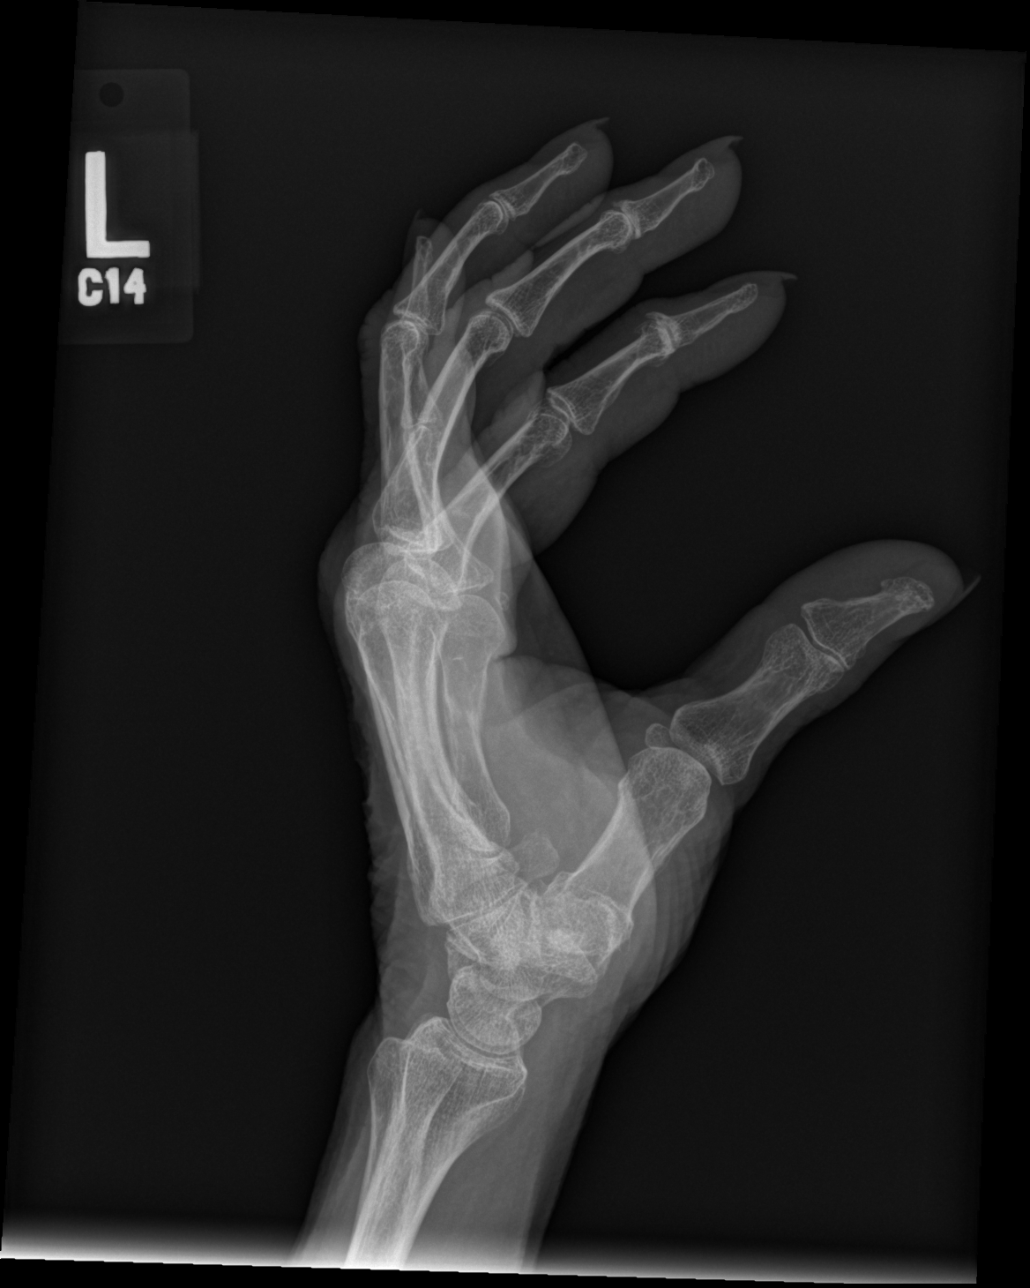

[3 of 3 positions shown; findings below may reference images not displayed]

FINDINGS: Slightly displaced fracture of the left fifth metacarpal noted.
Diffuse osteopenia degenerative change. No radiopaque foreign body.
IMPRESSION: Slightly displaced fracture of the left fifth metacarpal.

## 2020-03-01 MED ORDER — OXYCODONE-ACETAMINOPHEN 5-325 MG PO TABS
1.0000 | ORAL_TABLET | Freq: Once | ORAL | Status: AC
  Administered 2020-03-01: 15:00:00 1 via ORAL
  Filled 2020-03-01: qty 1

## 2020-03-01 MED ORDER — OXYCODONE-ACETAMINOPHEN 5-325 MG PO TABS: 1.0000 | ORAL_TABLET | Freq: Four times a day (QID) | ORAL | 0 refills | Status: AC | PRN

## 2020-03-01 NOTE — ED Provider Notes (Signed)
[facility] DEPT Provider Note   CSN: [id number] Arrival date & time: 03/01/20  1136     History Chief Complaint  Patient presents with  . Hand Injury    Melanie Caldwell is a 64 y.o. female.  Presents to ER with chief complaint left hand injury.  Patient states yesterday evening she was walking her dog when the dog suddenly pulled on the leash and she felt a pop in her left hand.  States she noted some pain initially seems to have been worsening overnight and today.  Swelling, denies any other injuries.  Not on blood thinners, denies any chronic medical conditions.  No numbness, weakness.  HPI     Past Medical History:  Diagnosis Date  . ADD (attention deficit disorder)   . Anxiety disorder   . Asthma   . Bulging lumbar disc   . COPD (chronic obstructive pulmonary disease) ([place])   . DDD (degenerative disc disease), lumbar   . Depression   . Fibroid uterus   . Gastric ulcer   . GERD (gastroesophageal reflux disease)   . History of nephrolithiasis   . History of UTI   . Pyloric stenosis   . Recreational drug use    History  . Tobacco abuse     Patient Active Problem List   Diagnosis Date Noted  . Adjustment disorder with depressed mood 04/19/2018  . GERD 10/16/2010  . CHEST PAIN UNSPECIFIED 10/16/2010  . NAUSEA AND VOMITING 10/16/2010  . ABDOMINAL PAIN, EPIGASTRIC 10/16/2010  . DIARRHEA 02/12/2009  . ASTHMA 10/01/2008  . LEG PAIN, BILATERAL 10/01/2008  . TOBACCO ABUSE 08/16/2008  . SHORTNESS OF BREATH 08/16/2008  . CHEST PAIN, ATYPICAL 08/16/2008  . RHINOSINUSITIS, ACUTE 07/25/2008  . FIBROIDS, UTERUS 07/12/2008  . CONSTIPATION 07/12/2008  . Abdominal pain, other specified site 07/06/2008    Past Surgical History:  Procedure Laterality Date  . ESOPHAGEAL DILATION    . EXPLORATION MIDDLE EAR     with revision of left stapedectomy and replacement of prosthesis  . FETAL SURGERY FOR CONGENITAL HERNIA    . HERNIA REPAIR    .  LITHOTRIPSY  1985  . TONSILLECTOMY       OB History   No obstetric history on file.     Family History  Problem Relation Age of Onset  . Coronary artery disease Mother   . Hypertension Mother   . Diabetes Father   . Colon cancer Maternal Grandfather     Social History   Tobacco Use  . Smoking status: Current Every Day Smoker    Packs/day: 0.50    Types: Cigarettes  . Smokeless tobacco: Never Used  . Tobacco comment: Patient given counseling sheet to quit smoking; now smoking :hookah with 1 cigarette daily....  Substance Use Topics  . Alcohol use: Yes    Comment: hx of heavy Etoh use in the past; now socially  . Drug use: No    Home Medications Prior to Admission medications   Medication Sig Start Date End Date Taking? Authorizing Provider  citalopram (CELEXA) 20 MG tablet Take 20 mg by mouth daily.  04/22/17   [provider]  DimenhyDRINATE (DRAMAMINE PO) Take 1 tablet by mouth 2 (two) times daily as needed (upset stomach).    [provider]  ibuprofen (ADVIL,MOTRIN) 600 MG tablet Take 1 tablet (600 mg total) by mouth every 6 (six) hours as needed. Patient not taking: Reported on 04/19/2018 05/03/17   [place] Lanius A, PA-C  Ibuprofen-Diphenhydramine Cit (  ADVIL PM) 200-38 MG TABS Take 1 tablet by mouth at bedtime as needed (sleep).    [provider]  omeprazole (PRILOSEC) 40 MG capsule Take 1 capsule (40 mg total) by mouth daily. Patient not taking: Reported on 04/19/2018 09/02/16   Ladene Artist, MD  ondansetron [facility]) 4 MG tablet take 1 tablet by mouth every 8 hours if needed for nausea and vomiting Patient not taking: Reported on 04/19/2018 04/05/17   Ladene Artist, MD  ondansetron (ZOFRAN-ODT) 8 MG disintegrating tablet 4mg  ODT q4 hours prn nausea/vomit Patient not taking: Reported on 04/19/2018 05/21/17   Deno Etienne, DO  oxyCODONE-acetaminophen (PERCOCET) 5-325 MG tablet Take 1 tablet by mouth every 6 (six) hours as needed for up to 3  days for severe pain. 03/01/20 03/04/20  Lucrezia Starch, MD  potassium chloride (K-DUR) 10 MEQ tablet Take 1 tablet (10 mEq total) by mouth daily. Patient not taking: Reported on 04/19/2018 05/03/17   Volanda Napoleon, PA-C  promethazine (PHENERGAN) 25 MG tablet take 1 tablet by mouth twice a day if needed for nausea and vomiting Patient not taking: Reported on 04/19/2018 01/15/17   Ladene Artist, MD  rosuvastatin (CRESTOR) 10 MG tablet Take 10 mg by mouth 2 (two) times a week.    [provider]  traZODone (DESYREL) 50 MG tablet Take 25-50 mg by mouth QHS 04/30/17   [provider]    Allergies    Adhesive [tape] and Ceftin [cefuroxime axetil]  Review of Systems   Review of Systems  Constitutional: Negative for chills and fever.  HENT: Negative for ear pain and sore throat.   Eyes: Negative for pain and visual disturbance.  Respiratory: Negative for cough and shortness of breath.   Cardiovascular: Negative for chest pain and palpitations.  Gastrointestinal: Negative for abdominal pain and vomiting.  Genitourinary: Negative for dysuria and hematuria.  Musculoskeletal: Positive for arthralgias. Negative for back pain.  Skin: Negative for color change and rash.  Neurological: Negative for seizures and syncope.  All other systems reviewed and are negative.   Physical Exam Updated Vital Signs BP (!) 151/96 (BP Location: Right Arm)   Pulse 83   Temp 98.1 F (36.7 C) (Oral)   Resp 18   SpO2 96%   Physical Exam Vitals and nursing note reviewed.  Constitutional:      General: She is not in acute distress.    Appearance: She is well-developed.  HENT:     Head: Normocephalic and atraumatic.  Eyes:     Conjunctiva/sclera: Conjunctivae normal.  Cardiovascular:     Rate and Rhythm: Normal rate and regular rhythm.     Pulses: Normal pulses.  Pulmonary:     Effort: Pulmonary effort is normal. No respiratory distress.  Musculoskeletal:     Cervical back: Neck  supple.     Comments: Left upper extremity, there is some tenderness, swelling over the left lateral hand, sensation intact, flexion, extension intact, normal radial and ulnar pulses  Skin:    General: Skin is warm and dry.     Capillary Refill: Capillary refill takes less than 2 seconds.  Neurological:     General: No focal deficit present.     Mental Status: She is alert. Mental status is at baseline.  Psychiatric:        Mood and Affect: Mood normal.        Behavior: Behavior normal.     ED Results / Procedures / Treatments   Labs (all labs ordered  are listed, but only abnormal results are displayed) Labs Reviewed - No data to display  EKG None  Radiology DG Hand Complete Left  Result Date: 03/01/2020 CLINICAL DATA:  Hand injury. EXAM: LEFT HAND - COMPLETE 3+ VIEW COMPARISON:  No recent. FINDINGS: Slightly displaced fracture of the left fifth metacarpal noted. Diffuse osteopenia degenerative change. No radiopaque foreign body. IMPRESSION: Slightly displaced fracture of the left fifth metacarpal. Electronically Signed   By: Marcello Moores  Register   On: 03/01/2020 13:13    Procedures Procedures (including critical care time)  Medications Ordered in ED Medications  oxyCODONE-acetaminophen (PERCOCET/ROXICET) 5-325 MG per tablet 1 tablet (has no administration in time range)    ED Course  I have reviewed the triage vital signs and the nursing notes.  Pertinent labs & imaging results that were available during my care of the patient were reviewed by me and considered in my medical decision making (see chart for details).    MDM Rules/Calculators/A&P                      64 year old lady with isolated left hand injury.  X-ray with fifth metacarpal fracture, slightly displaced.  Will place an ulnar gutter splint, recommend follow-up with hand surgery.  Pain well controlled, neurovascularly intact.  Gave short Rx for Percocet.  Reviewed return precautions, discharged    After the  discussed management above, the patient was determined to be safe for discharge.  The patient was in agreement with this plan and all questions regarding their care were answered.  ED return precautions were discussed and the patient will return to the ED with any significant worsening of condition.    Final Clinical Impression(s) / ED Diagnoses Final diagnoses:  Closed fracture of left hand, initial encounter  Closed nondisplaced fracture of shaft of fifth metacarpal bone of left hand, initial encounter    Rx / DC Orders ED Discharge Orders         Ordered    oxyCODONE-acetaminophen (PERCOCET) 5-325 MG tablet  Every 6 hours PRN     03/01/20 1449           Lucrezia Starch, MD 03/01/20 1505

## 2020-03-01 NOTE — ED Triage Notes (Signed)
Pt reports walking her dog yesterday evening and when her dog pulled on the leash she felt something pop in her left hand. Hand is mildly swollen with some bruising.

## 2020-03-01 NOTE — Discharge Instructions (Signed)
Please take Percocet as needed for pain control.  Additionally would recommend Tylenol Motrin as needed.  Please keep splint clean and dry.  You need follow-up with hand orthopedic surgery.  Recommend no weightbearing on your left hand until you have been evaluated by surgery.  Keep the splint clean and dry, this should not get wet at any point.  If you have uncontrolled pain, develop numbness, weakness or other new concerning symptom, return to ER for reassessment.

## 2020-03-01 NOTE — Progress Notes (Signed)
Orthopedic Tech Progress Note Patient Details:  Melanie Caldwell 1956-01-17 [id number]  Ortho Devices Type of Ortho Device: Ace wrap, Ulna gutter splint Ortho Device/Splint Location: left Ortho Device/Splint Interventions: Application   Post Interventions Patient Tolerated: Well Instructions Provided: Care of device   [name] 03/01/2020, 2:57 PM

## 2020-03-14 DIAGNOSIS — S62309A Unspecified fracture of unspecified metacarpal bone, initial encounter for closed fracture: Secondary | ICD-10-CM | POA: Insufficient documentation

## 2020-12-03 ENCOUNTER — Encounter: Payer: Self-pay | Admitting: Gastroenterology

## 2021-05-23 ENCOUNTER — Other Ambulatory Visit: Payer: Self-pay

## 2021-05-23 ENCOUNTER — Encounter (HOSPITAL_COMMUNITY): Payer: Self-pay

## 2021-05-23 ENCOUNTER — Emergency Department (HOSPITAL_COMMUNITY): Payer: Medicare Other

## 2021-05-23 ENCOUNTER — Emergency Department (HOSPITAL_COMMUNITY)
Admission: EM | Admit: 2021-05-23 | Discharge: 2021-05-24 | Disposition: A | Discharge status: Home / Self Care | Payer: Medicare Other | Attending: Emergency Medicine | Admitting: Emergency Medicine

## 2021-05-23 DIAGNOSIS — R110 Nausea: Secondary | ICD-10-CM | POA: Diagnosis not present

## 2021-05-23 DIAGNOSIS — R1031 Right lower quadrant pain: Secondary | ICD-10-CM

## 2021-05-23 DIAGNOSIS — J45909 Unspecified asthma, uncomplicated: Secondary | ICD-10-CM | POA: Insufficient documentation

## 2021-05-23 DIAGNOSIS — F17210 Nicotine dependence, cigarettes, uncomplicated: Secondary | ICD-10-CM | POA: Insufficient documentation

## 2021-05-23 DIAGNOSIS — J449 Chronic obstructive pulmonary disease, unspecified: Secondary | ICD-10-CM | POA: Insufficient documentation

## 2021-05-23 DIAGNOSIS — R10A1 Flank pain, right side: Secondary | ICD-10-CM

## 2021-05-23 DIAGNOSIS — E876 Hypokalemia: Secondary | ICD-10-CM | POA: Diagnosis not present

## 2021-05-23 DIAGNOSIS — R109 Unspecified abdominal pain: Secondary | ICD-10-CM

## 2021-05-23 LAB — COMPREHENSIVE METABOLIC PANEL
ALT: 13 U/L (ref 0–44)
AST: 18 U/L (ref 15–41)
Albumin: 4.6 g/dL (ref 3.5–5.0)
Alkaline Phosphatase: 52 U/L (ref 38–126)
Anion gap: 9 (ref 5–15)
BUN: 11 mg/dL (ref 8–23)
CO2: 28 mmol/L (ref 22–32)
Calcium: 9.8 mg/dL (ref 8.9–10.3)
Chloride: 102 mmol/L (ref 98–111)
Creatinine, Ser: 0.69 mg/dL (ref 0.44–1.00)
GFR, Estimated: >60 mL/min (ref >60)
Glucose, Bld: 123 mg/dL — ABNORMAL HIGH (ref 70–99)
Potassium: 3.1 mmol/L — ABNORMAL LOW (ref 3.5–5.1)
Sodium: 139 mmol/L (ref 135–145)
Total Bilirubin: 0.8 mg/dL (ref 0.3–1.2)
Total Protein: 7 g/dL (ref 6.5–8.1)

## 2021-05-23 LAB — URINALYSIS, ROUTINE W REFLEX MICROSCOPIC
Bilirubin Urine: NEGATIVE
Glucose, UA: NEGATIVE mg/dL
Ketones, ur: NEGATIVE mg/dL
Nitrite: NEGATIVE
Protein, ur: NEGATIVE mg/dL
Specific Gravity, Urine: 1.01 (ref 1.005–1.030)
pH: 6 (ref 5.0–8.0)

## 2021-05-23 LAB — LIPASE, BLOOD: Lipase: 48 U/L (ref 11–51)

## 2021-05-23 LAB — CBC
HCT: 44.9 % (ref 36.0–46.0)
Hemoglobin: 15 g/dL (ref 12.0–15.0)
MCH: 30.9 pg (ref 26.0–34.0)
MCHC: 33.4 g/dL (ref 30.0–36.0)
MCV: 92.6 fL (ref 80.0–100.0)
Platelets: 187 10*3/uL (ref 150–400)
RBC: 4.85 MIL/uL (ref 3.87–5.11)
RDW: 12.3 % (ref 11.5–15.5)
WBC: 6.9 10*3/uL (ref 4.0–10.5)
nRBC: 0 % (ref 0.0–0.2)

## 2021-05-23 IMAGING — CT CT RENAL STONE PROTOCOL
2 of 4 series · 16 of 46 positions shown, 18 images · non-contrast
Comparison: [DATE]

CLINICAL DATA: Right flank pain since yesterday. Kidney stone
suspected. History kidney stones.

EXAM:
CT ABDOMEN AND PELVIS WITHOUT CONTRAST
TECHNIQUE: Multidetector CT imaging of the abdomen and pelvis was performed
following the standard protocol without IV contrast.

[Series 2: axial st · axial · 0.57mm/px · z∈[+1009,+1319]mm · 13 of 72 slices shown, 15 images]
[im 5/72  soft-tissue]
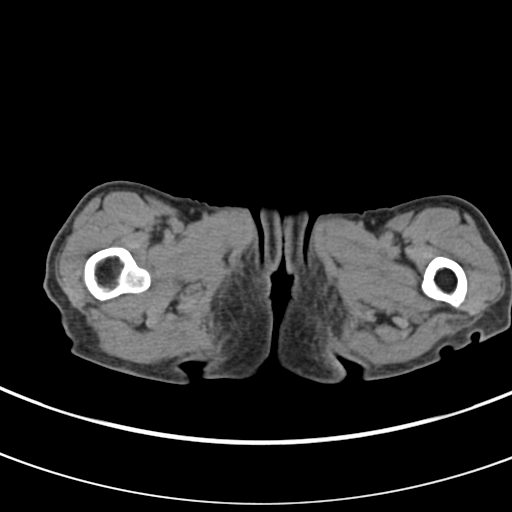
[im 5/72  bone]
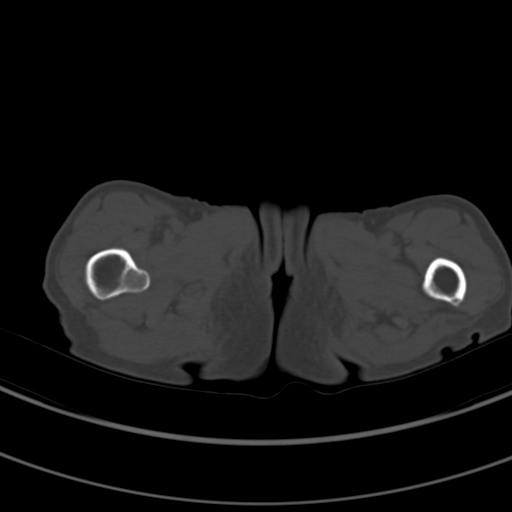
[im 9/72  soft-tissue]
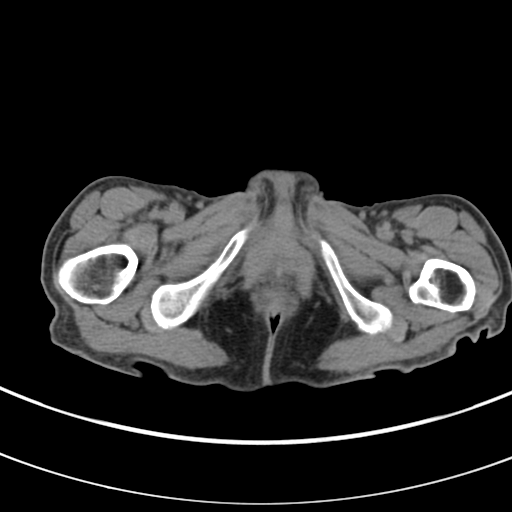
[im 17/72  soft-tissue]
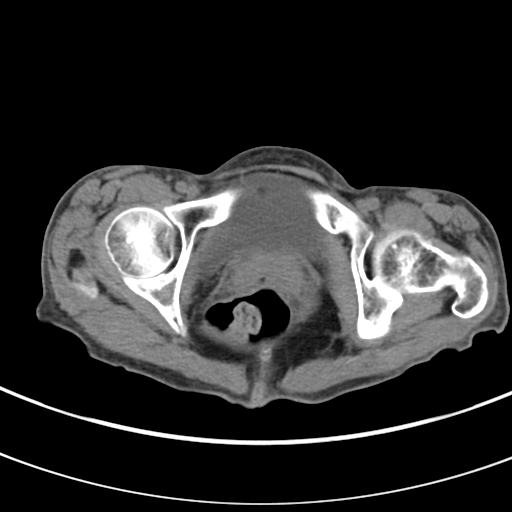
[im 21/72  soft-tissue]
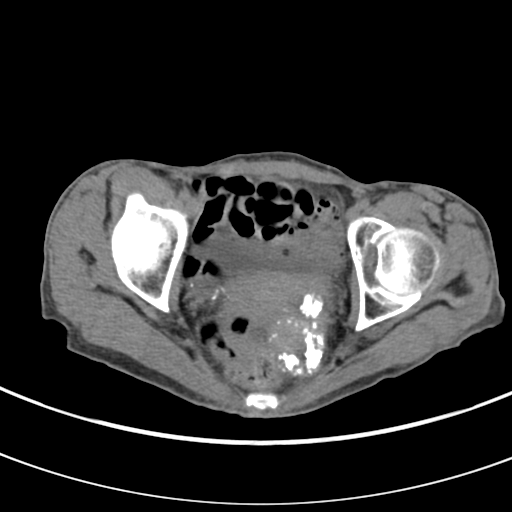
[im 26/72  soft-tissue]
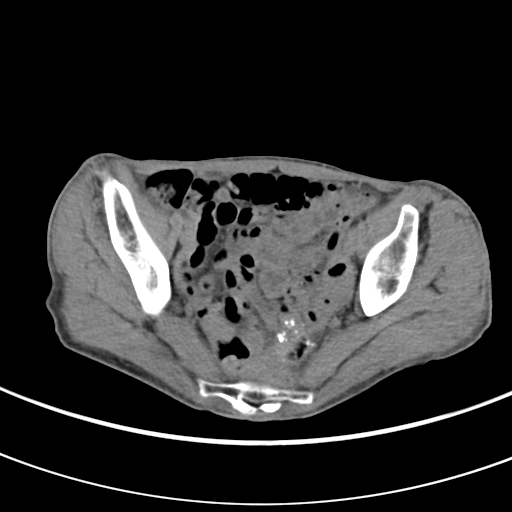
[im 30/72  soft-tissue]
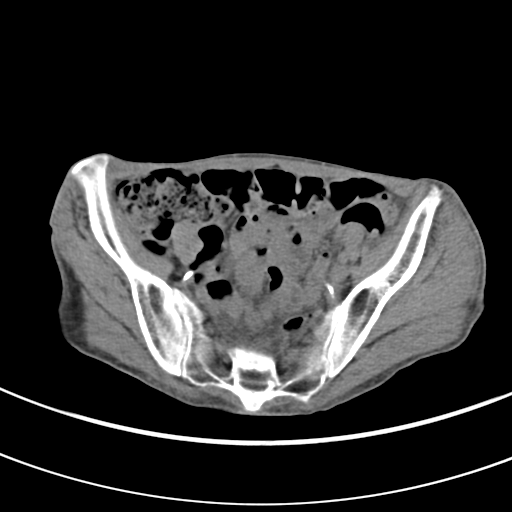
[im 38/72  soft-tissue]
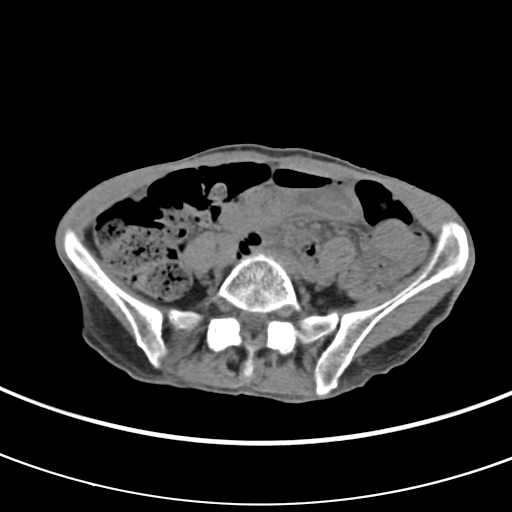
[im 42/72  soft-tissue]
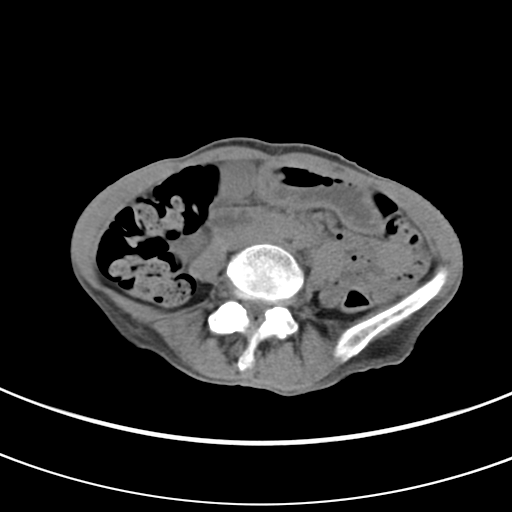
[im 46/72  soft-tissue]
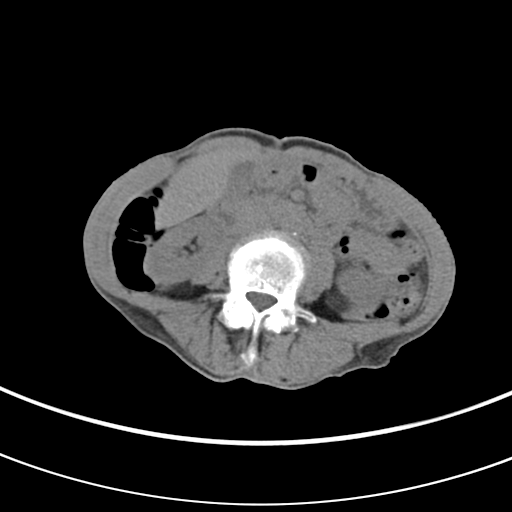
[im 46/72  bone]
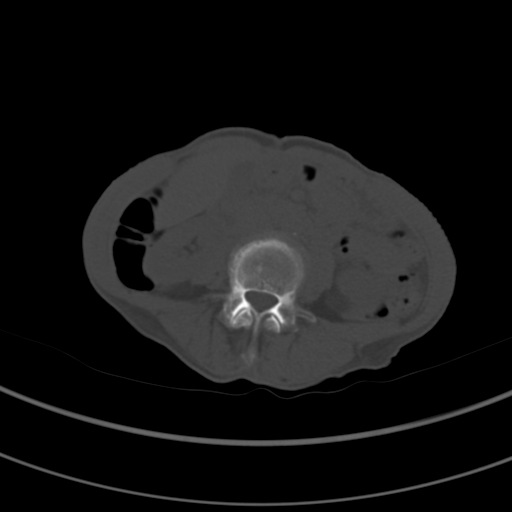
[im 51/72  soft-tissue]
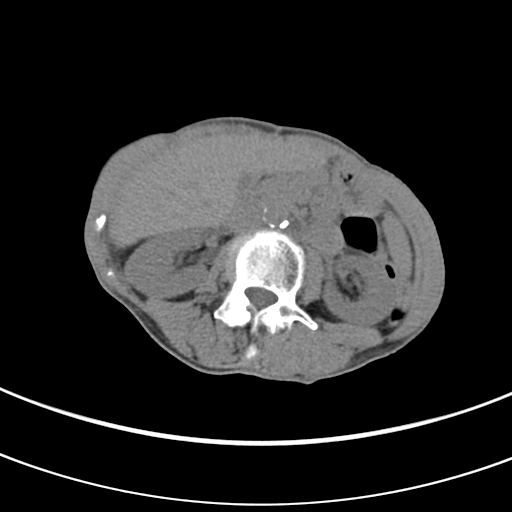
[im 55/72  soft-tissue]
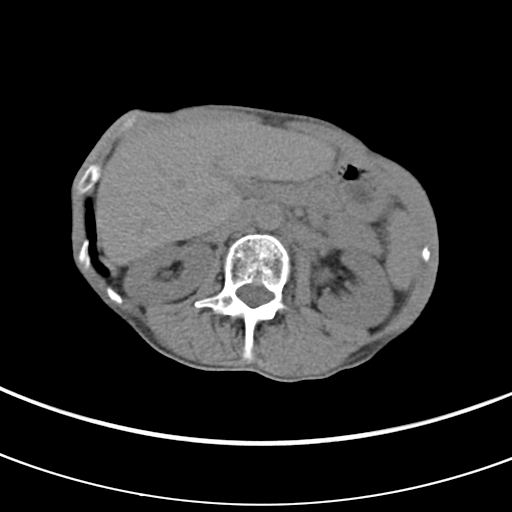
[im 63/72  soft-tissue]
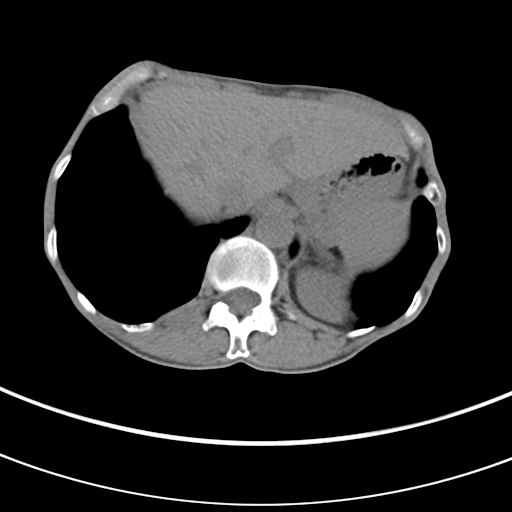
[im 67/72  soft-tissue]
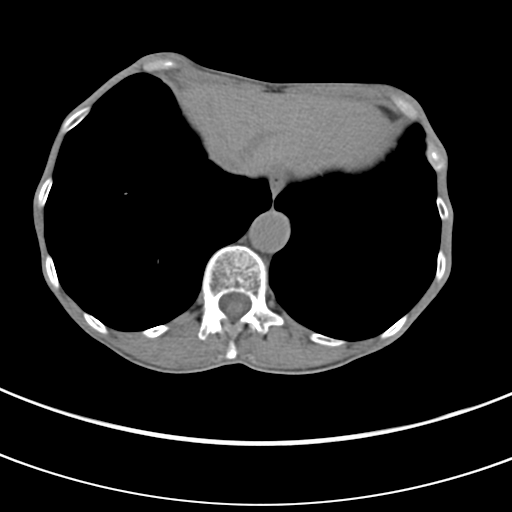

[Series 5: coronal · coronal · 0.54mm/px · 3 of 100 slices shown]
[im 34/100  soft-tissue]
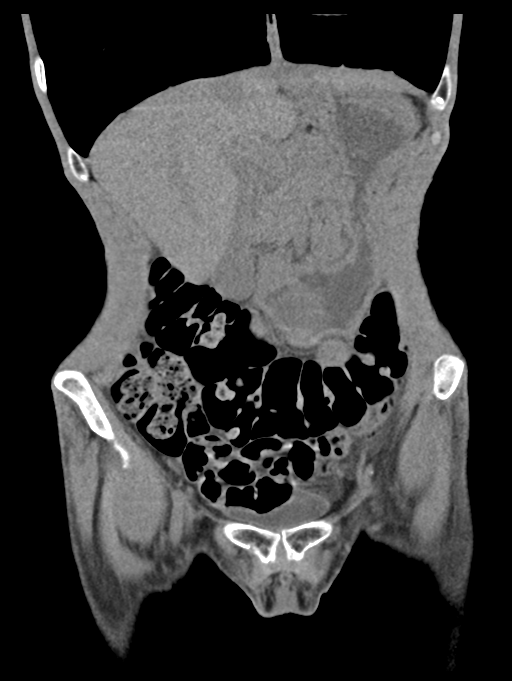
[im 45/100  soft-tissue]
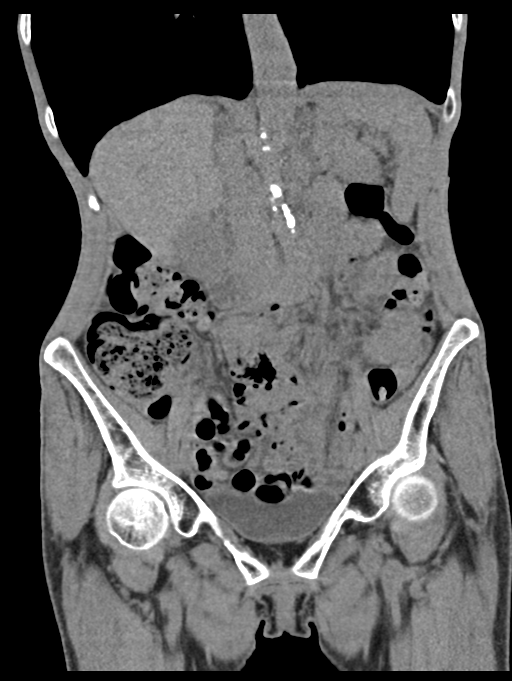
[im 56/100  soft-tissue]
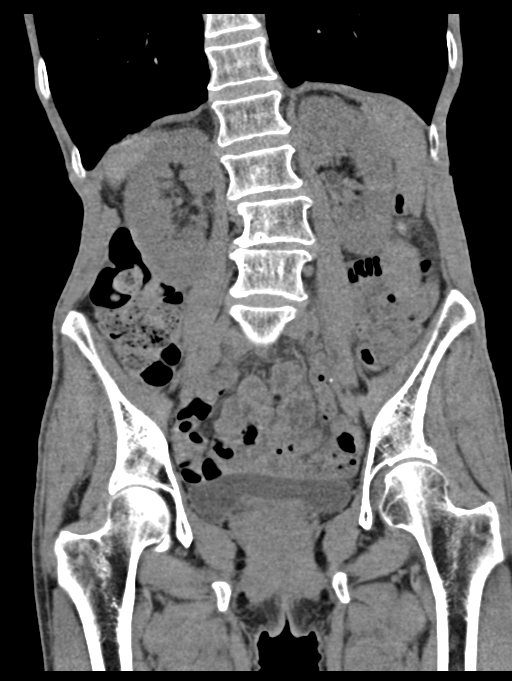

[16 of 46 positions shown; findings below may reference images not displayed]

FINDINGS: Lower chest: Clear lung bases. Normal heart size without pericardial
or pleural effusion.

Hepatobiliary: Normal liver. Normal gallbladder, without biliary
ductal dilatation.

Pancreas: Normal, without mass or ductal dilatation.

Spleen: Normal in size, without focal abnormality.

Adrenals/Urinary Tract: Normal adrenal glands. Punctate, right
larger than left renal collecting system calculi. No hydronephrosis.
Paucity of fat degrades evaluation of the ureters. No convincing
evidence of hydroureter or ureteric stone. No bladder calculi. There
are vascular calcifications within the pelvis.

Stomach/Bowel: Normal stomach, without wall thickening. Colonic
stool burden suggests constipation. Appendix not identified on this
nondedicated exam. Normal small bowel.

Vascular/Lymphatic: Aortic atherosclerosis. Paucity of
retroperitoneal fat, degrading evaluation for adenopathy.

Reproductive: Posterior uterine fundal fibroid including at 3.4 cm.
No adnexal mass.

Other: No significant free fluid.  No free intraperitoneal air.

Musculoskeletal: Osteopenia.  Convex left lumbar spine curvature.
IMPRESSION: 1. Bilateral nephrolithiasis, without obstructive uropathy.
2.  Possible constipation.
3. Otherwise, low sensitivity exam secondary to paucity of
abdominopelvic fat. Appendix not confidently identified. Please note
that since [DATE], patient has had a significant decrease in
intra-abdominal and pelvic fat. Correlate with excessive weight loss
or cachexia.
4. Uterine fibroids.
5.  Aortic Atherosclerosis ([3B]-[3B]).

## 2021-05-23 MED ORDER — ONDANSETRON 4 MG PO TBDP: 4.0000 mg | ORAL_TABLET | Freq: Once | ORAL | Status: DC

## 2021-05-23 NOTE — ED Triage Notes (Signed)
Pt reports right sided flank pain and vomiting that began yesterday.

## 2021-05-23 NOTE — ED Provider Notes (Signed)
Emergency Medicine Provider Triage Evaluation Note  Melanie Caldwell , a 65 y.o. female  was evaluated in triage.  Pt complains of flank pain.  Review of Systems  Positive: Right flank pain, nausea, vomiting Negative: Fever, dysuria, hematuria  Physical Exam  BP 138/85 (BP Location: Left Arm)   Pulse (!) 112   Temp 98 F (36.7 C) (Oral)   Resp 18   SpO2 100%  Gen:   Awake, no distress   Resp:  Normal effort  MSK:   Moves extremities without difficulty  Other:  R CVA tenderness  Medical Decision Making  Medically screening exam initiated at 4:50 PM.  Appropriate orders placed.  Melanie Caldwell was informed that the remainder of the evaluation will be completed by another provider, this initial triage assessment does not replace that evaluation, and the importance of remaining in the ED until their evaluation is complete.  Patient with significant history of kidney stone required lithotripsy in the past presents with acute onset of right flank pain since last night with associated nausea and vomiting.   Domenic Moras, PA-C 05/23/21 1651    Charlesetta Shanks, MD 05/23/21 [place]

## 2021-05-24 ENCOUNTER — Emergency Department (HOSPITAL_COMMUNITY): Payer: Medicare Other

## 2021-05-24 ENCOUNTER — Encounter (HOSPITAL_COMMUNITY): Payer: Self-pay

## 2021-05-24 IMAGING — CT CT ABD-PELV W/ CM
2 of 5 series · 16 of 46 positions shown, 18 images · IV contrast (omnipaque)
Comparison: Noncontrast CT Abdomen and Pelvis yesterday. CT Abdomen
and Pelvis [DATE].

CLINICAL DATA: 64-year-old female with right side flank pain and
vomiting since yesterday.

EXAM:
CT ABDOMEN AND PELVIS WITH CONTRAST
TECHNIQUE: Multidetector CT imaging of the abdomen and pelvis was performed
using the standard protocol following bolus administration of
intravenous contrast.
CONTRAST:  80mL OMNIPAQUE IOHEXOL 350 MG/ML SOLN

[Series 2: axial st · axial · 0.57mm/px · z∈[-409,-109]mm · 13 of 71 slices shown, 15 images]
[im 6/71  soft-tissue]
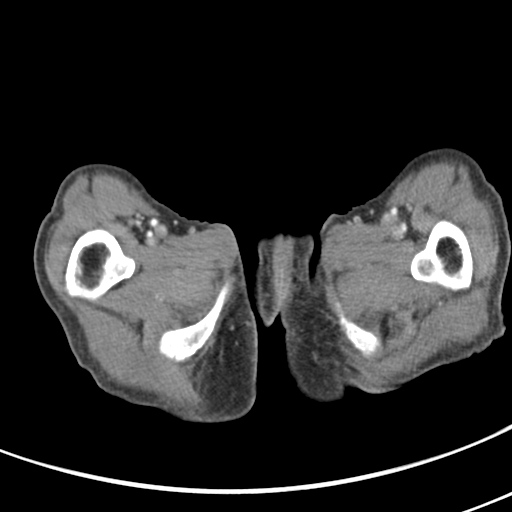
[im 6/71  bone]
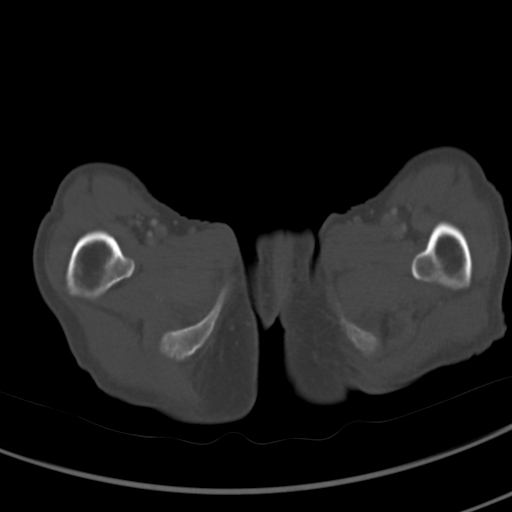
[im 11/71  soft-tissue]
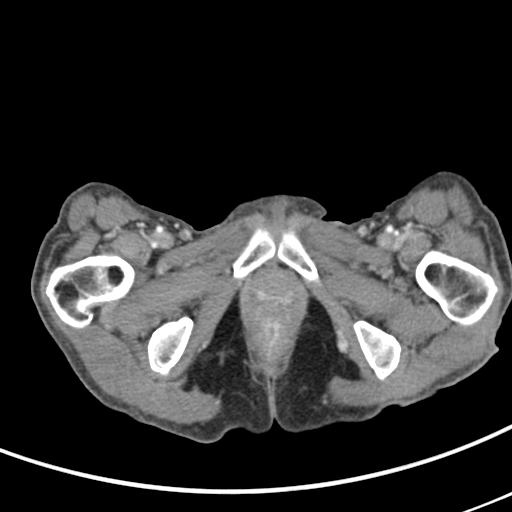
[im 16/71  soft-tissue]
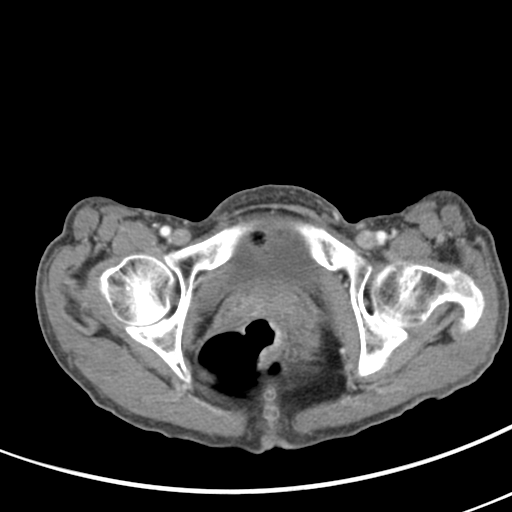
[im 21/71  soft-tissue]
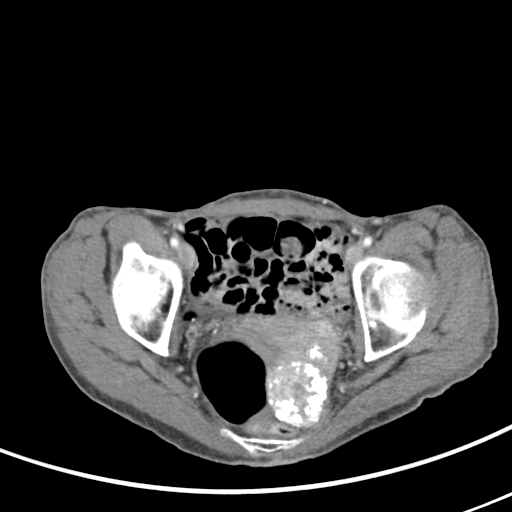
[im 26/71  soft-tissue]
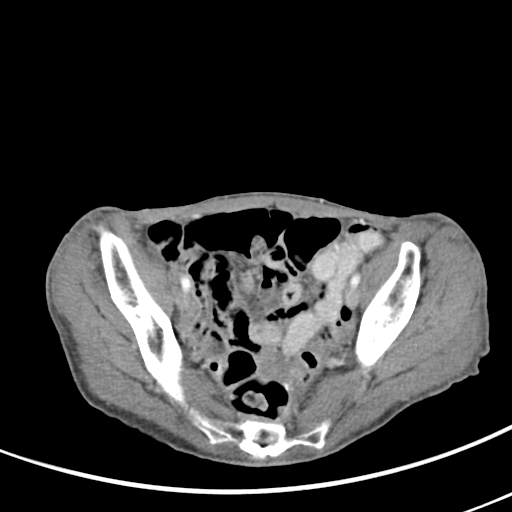
[im 31/71  soft-tissue]
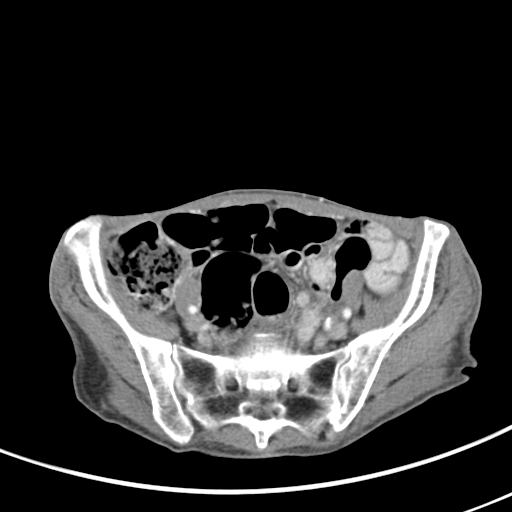
[im 36/71  soft-tissue]
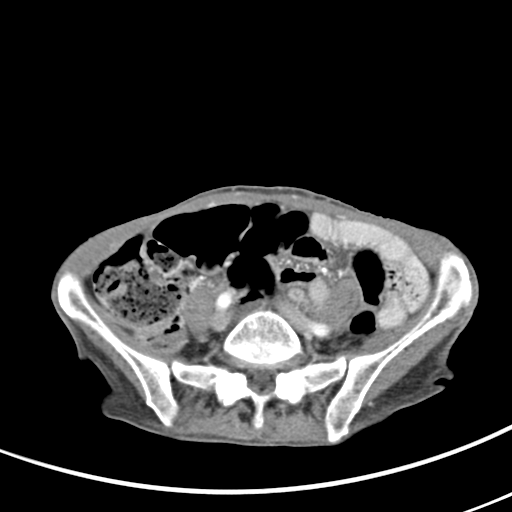
[im 41/71  soft-tissue]
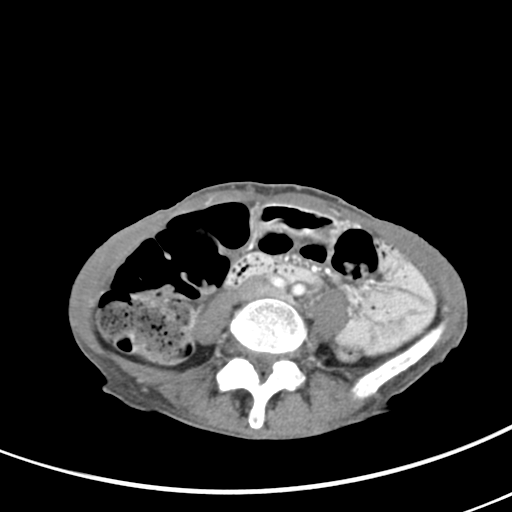
[im 46/71  soft-tissue]
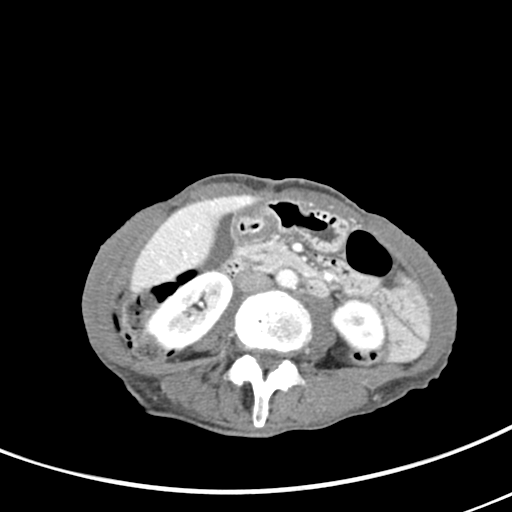
[im 46/71  bone]
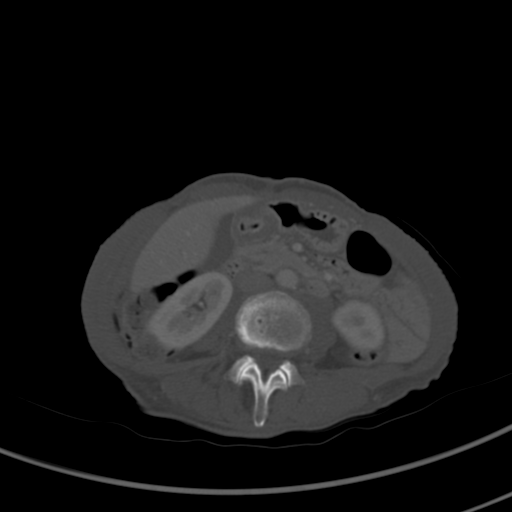
[im 51/71  soft-tissue]
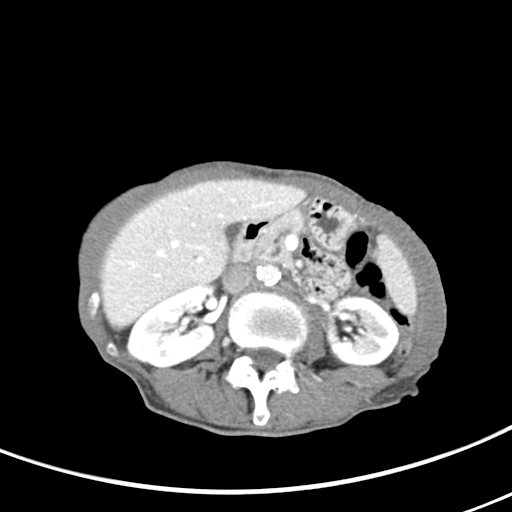
[im 56/71  soft-tissue]
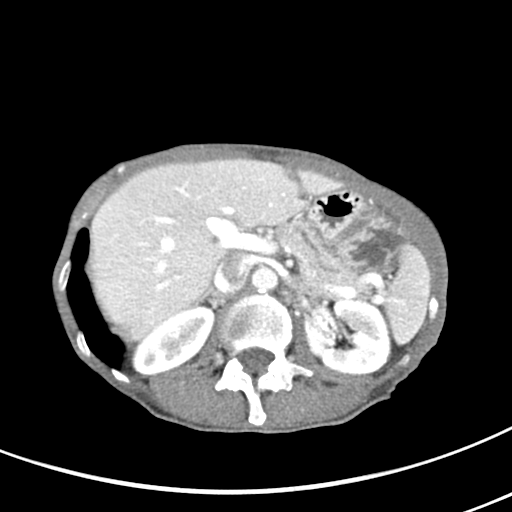
[im 61/71  soft-tissue]
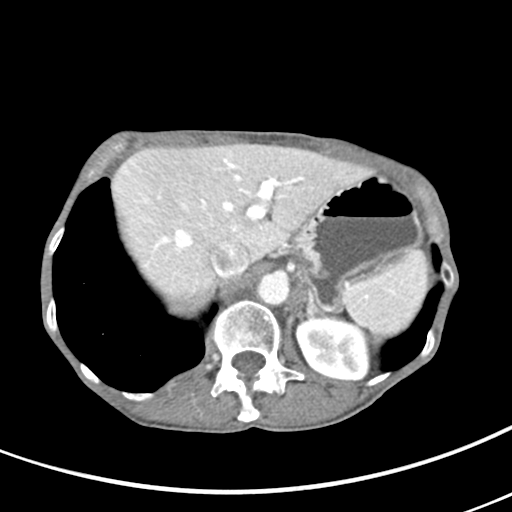
[im 66/71  soft-tissue]
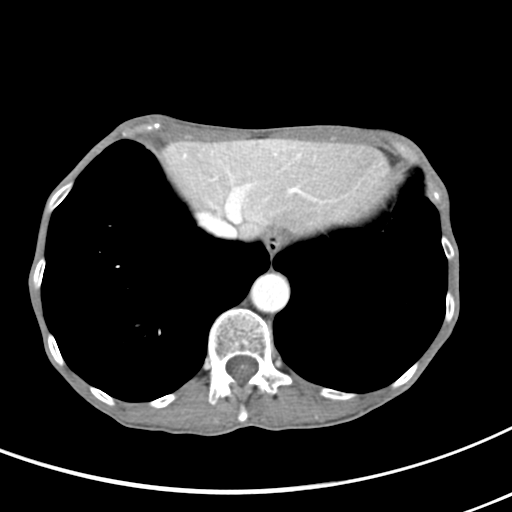

[Series 5: coronal st · coronal · 0.59mm/px · 3 of 105 slices shown]
[im 35/105  soft-tissue]
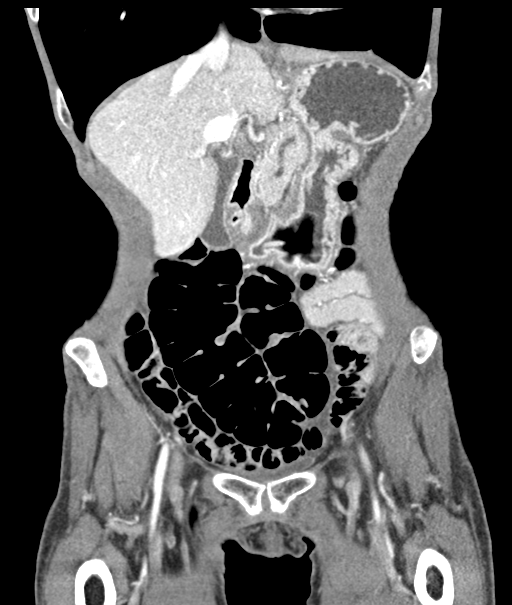
[im 47/105  soft-tissue]
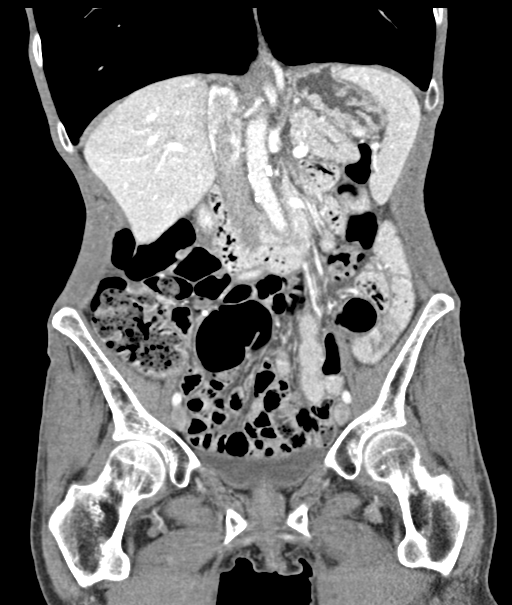
[im 58/105  soft-tissue]
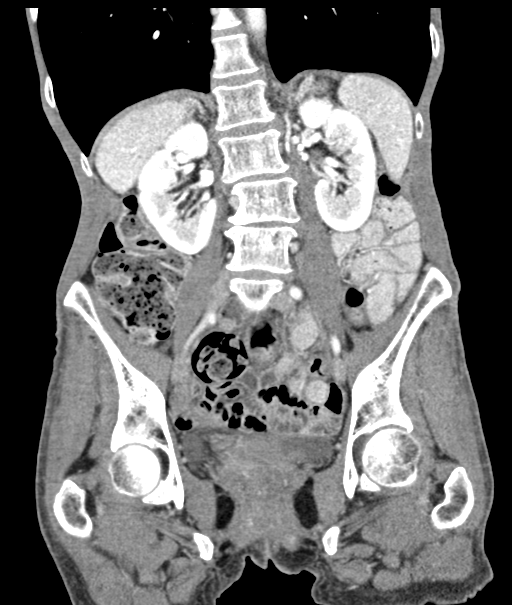

[16 of 46 positions shown; findings below may reference images not displayed]

FINDINGS: Lower chest: Evidence of pulmonary hyperinflation, otherwise
negative.

Hepatobiliary: Negative liver and gallbladder.

Pancreas: Main pancreatic duct is at the upper limits of normal. No
pancreatic inflammation.

Spleen: Negative.

Adrenals/Urinary Tract: Negative. Symmetric renal enhancement and
contrast excretion. Diminutive bladder.

Stomach/Bowel: Decreased subcutaneous and visceral fat since the
[II] CT. Paucity of fat now in the mesentery. Subsequently, some
bowel loops are difficult to delineate. But there is no dilated
small or large bowel in the abdomen. Moderate volume retained stool
in the right colon. Gas distended rectum. Small volume retained
fluid in the stomach. No obvious gastroduodenal inflammation. No
free air or free fluid. No discrete bowel inflammation elsewhere.

Vascular/Lymphatic: Calcified aortic atherosclerosis. Major arterial
structures are patent. No lymphadenopathy. Portal venous system is
patent.

Reproductive: Calcified uterine fibroids are stable since [II].

Other: No pelvic free fluid.

Musculoskeletal: Chronic mid lumbar disc and endplate degeneration.
Mild levoconvex scoliosis. No acute osseous abnormality identified.
IMPRESSION: 1. Cachectic; substantially decreased subcutaneous and visceral fat
since [II]. No acute or inflammatory process identified in the
abdomen or pelvis.
2. Pulmonary hyperinflation suspected. Aortic Atherosclerosis
([II]-[II]).

## 2021-05-24 MED ORDER — FENTANYL CITRATE (PF) 100 MCG/2ML IJ SOLN
25.0000 ug | Freq: Once | INTRAMUSCULAR | Status: AC
  Administered 2021-05-24: 25 ug via INTRAVENOUS
  Filled 2021-05-24: qty 2

## 2021-05-24 MED ORDER — SODIUM CHLORIDE 0.9 % IV BOLUS
500.0000 mL | Freq: Once | INTRAVENOUS | Status: AC
  Administered 2021-05-24: 500 mL via INTRAVENOUS

## 2021-05-24 MED ORDER — ONDANSETRON 4 MG PO TBDP: 4.0000 mg | ORAL_TABLET | Freq: Three times a day (TID) | ORAL | 0 refills | Status: DC | PRN

## 2021-05-24 MED ORDER — ONDANSETRON HCL 4 MG/2ML IJ SOLN
4.0000 mg | Freq: Once | INTRAMUSCULAR | Status: AC
  Administered 2021-05-24: 4 mg via INTRAVENOUS
  Filled 2021-05-24: qty 2

## 2021-05-24 MED ORDER — IOHEXOL 350 MG/ML SOLN
80.0000 mL | Freq: Once | INTRAVENOUS | Status: AC | PRN
  Administered 2021-05-24: 80 mL via INTRAVENOUS

## 2021-05-24 MED ORDER — FENTANYL CITRATE (PF) 100 MCG/2ML IJ SOLN
25.0000 ug | Freq: Once | INTRAMUSCULAR | Status: AC
Start: 2021-05-24 — End: 2021-05-24
  Administered 2021-05-24: 25 ug via INTRAVENOUS
  Filled 2021-05-24: qty 2

## 2021-05-24 MED ORDER — SODIUM CHLORIDE 0.9 % IV SOLN: Freq: Once | INTRAVENOUS | Status: AC

## 2021-05-24 MED ORDER — POTASSIUM CHLORIDE CRYS ER 20 MEQ PO TBCR: 20.0000 meq | EXTENDED_RELEASE_TABLET | Freq: Two times a day (BID) | ORAL | 0 refills | Status: DC

## 2021-05-24 MED ORDER — ONDANSETRON 4 MG PO TBDP
4.0000 mg | ORAL_TABLET | Freq: Once | ORAL | Status: AC
  Administered 2021-05-24: 4 mg via ORAL
  Filled 2021-05-24: qty 1

## 2021-05-24 NOTE — ED Notes (Signed)
Patient transported to CT 

## 2021-05-24 NOTE — ED Provider Notes (Signed)
Patient evaluated and discharged by preceding ED provider Charlann Lange, PA-C.  Patient requesting more nausea medication prior to discharge; preceding ED provider has left the department at this time.  I did review patient's medications that were administered throughout her ED stay and feel this appropriate for her to have 1 final dose of ODT Zofran.  This has been ordered.  This chart was dictated using voice recognition software, Dragon. Despite the best efforts of this provider to proofread and correct errors, errors may still occur which can change documentation meaning.    Emeline Darling, PA-C 05/24/21 0753    Daleen Bo, MD 05/24/21 [phone]

## 2021-05-24 NOTE — Discharge Instructions (Addendum)
Your lab and xray studies are all essentially normal and do not identify any serious condition. Take Zofran for nausea. Recommend Miralax to relieve constipation as seen on your xrays. Advance diet as tolerated.   Return to the ED with any fever, severe pain, bloody stools or uncontrolled vomiting.

## 2021-05-24 NOTE — ED Notes (Signed)
Verbalized understanding discharge instructions, prescription, and follow-up. In no acute distress.

## 2021-05-24 NOTE — ED Provider Notes (Signed)
[facility] DEPT Provider Note   CSN: [id number] Arrival date & time: 05/23/21  1450     History Chief Complaint  Patient presents with   Emesis   Flank Pain    Melanie Caldwell is a 65 y.o. female.  Patient with history of kidney stones, COPD presents with right flank and right lower abdominal pain that started 1-2 days ago. She reports she feels like previous episodes of kidney stones. No fever or chills. No respiratory symptoms. She has nausea with vomiting, no diarrhea. She denies urinary symptoms of dysuria or hematuria.   The history is provided by the patient. No language interpreter was used.  Emesis Associated symptoms: abdominal pain   Associated symptoms: no chills, no diarrhea and no fever   Flank Pain Associated symptoms include abdominal pain.      Past Medical History:  Diagnosis Date   ADD (attention deficit disorder)    Anxiety disorder    Asthma    Bulging lumbar disc    COPD (chronic obstructive pulmonary disease) (HCC)    DDD (degenerative disc disease), lumbar    Depression    Fibroid uterus    Gastric ulcer    GERD (gastroesophageal reflux disease)    History of nephrolithiasis    History of UTI    Pyloric stenosis    Recreational drug use    History   Tobacco abuse     Patient Active Problem List   Diagnosis Date Noted   Adjustment disorder with depressed mood 04/19/2018   GERD 10/16/2010   CHEST PAIN UNSPECIFIED 10/16/2010   NAUSEA AND VOMITING 10/16/2010   ABDOMINAL PAIN, EPIGASTRIC 10/16/2010   DIARRHEA 02/12/2009   ASTHMA 10/01/2008   LEG PAIN, BILATERAL 10/01/2008   TOBACCO ABUSE 08/16/2008   SHORTNESS OF BREATH 08/16/2008   CHEST PAIN, ATYPICAL 08/16/2008   RHINOSINUSITIS, ACUTE 07/25/2008   FIBROIDS, UTERUS 07/12/2008   CONSTIPATION 07/12/2008   Abdominal pain, other specified site 07/06/2008    Past Surgical History:  Procedure Laterality Date   ESOPHAGEAL DILATION     EXPLORATION MIDDLE  EAR     with revision of left stapedectomy and replacement of prosthesis   FETAL SURGERY FOR CONGENITAL HERNIA     HERNIA REPAIR     LITHOTRIPSY  1985   TONSILLECTOMY       OB History   No obstetric history on file.     Family History  Problem Relation Age of Onset   Coronary artery disease Mother    Hypertension Mother    Diabetes Father    Colon cancer Maternal Grandfather     Social History   Tobacco Use   Smoking status: Every Day    Packs/day: 0.50    Types: Cigarettes   Smokeless tobacco: Never   Tobacco comments:    Patient given counseling sheet to quit smoking; now smoking :hookah with 1 cigarette daily....  Substance Use Topics   Alcohol use: Yes    Comment: hx of heavy Etoh use in the past; now socially   Drug use: No    Home Medications Prior to Admission medications   Medication Sig Start Date End Date Taking? Authorizing Provider  citalopram (CELEXA) 20 MG tablet Take 20 mg by mouth daily.  04/22/17   [provider]  DimenhyDRINATE (DRAMAMINE PO) Take 1 tablet by mouth 2 (two) times daily as needed (upset stomach).    [provider]  ibuprofen (ADVIL,MOTRIN) 600 MG tablet Take 1 tablet (600 mg total)  by mouth every 6 (six) hours as needed. Patient not taking: Reported on 04/19/2018 05/03/17   Providence Lanius A, PA-C  Ibuprofen-Diphenhydramine Cit (ADVIL PM) 200-38 MG TABS Take 1 tablet by mouth at bedtime as needed (sleep).    [provider]  omeprazole (PRILOSEC) 40 MG capsule Take 1 capsule (40 mg total) by mouth daily. Patient not taking: Reported on 04/19/2018 09/02/16   Ladene Artist, MD  ondansetron [facility]) 4 MG tablet take 1 tablet by mouth every 8 hours if needed for nausea and vomiting Patient not taking: Reported on 04/19/2018 04/05/17   Ladene Artist, MD  ondansetron (ZOFRAN-ODT) 8 MG disintegrating tablet '4mg'$  ODT q4 hours prn nausea/vomit Patient not taking: Reported on 04/19/2018 05/21/17   Deno Etienne, DO   potassium chloride (K-DUR) 10 MEQ tablet Take 1 tablet (10 mEq total) by mouth daily. Patient not taking: Reported on 04/19/2018 05/03/17   Volanda Napoleon, PA-C  promethazine (PHENERGAN) 25 MG tablet take 1 tablet by mouth twice a day if needed for nausea and vomiting Patient not taking: Reported on 04/19/2018 01/15/17   Ladene Artist, MD  rosuvastatin (CRESTOR) 10 MG tablet Take 10 mg by mouth 2 (two) times a week.    [provider]  traZODone (DESYREL) 50 MG tablet Take 25-50 mg by mouth QHS 04/30/17   [provider]    Allergies    Adhesive [tape] and Ceftin [cefuroxime axetil]  Review of Systems   Review of Systems  Constitutional:  Negative for chills and fever.  HENT: Negative.    Respiratory: Negative.    Cardiovascular: Negative.   Gastrointestinal:  Positive for abdominal pain, nausea and vomiting. Negative for constipation and diarrhea.  Genitourinary:  Positive for flank pain.  Skin: Negative.   Neurological: Negative.    Physical Exam Updated Vital Signs BP 134/89 (BP Location: Left Arm)   Pulse (!) 58   Temp 98 F (36.7 C) (Oral)   Resp 15   SpO2 95%   Physical Exam Vitals and nursing note reviewed.  Constitutional:      General: She is not in acute distress.    Appearance: She is well-developed. She is not ill-appearing.  Cardiovascular:     Rate and Rhythm: Normal rate.  Pulmonary:     Effort: Pulmonary effort is normal.  Abdominal:     Palpations: Abdomen is soft.     Tenderness: There is right CVA tenderness.       Comments: RLQ tenderness with guarding. No distention.   Musculoskeletal:        General: Normal range of motion.     Cervical back: Normal range of motion.       Back:  Skin:    General: Skin is warm and dry.  Neurological:     Mental Status: She is alert and oriented to person, place, and time.    ED Results / Procedures / Treatments   Labs (all labs ordered are listed, but only abnormal results are  displayed) Labs Reviewed  COMPREHENSIVE METABOLIC PANEL - Abnormal; Notable for the following components:      Result Value   Potassium 3.1 (*)    Glucose, Bld 123 (*)    All other components within normal limits  URINALYSIS, ROUTINE W REFLEX MICROSCOPIC - Abnormal; Notable for the following components:   Hgb urine dipstick SMALL (*)    Leukocytes,Ua MODERATE (*)    Bacteria, UA RARE (*)    All other components within normal limits  LIPASE, BLOOD  CBC    EKG None  Radiology CT Renal Stone Study  Result Date: 05/23/2021 CLINICAL DATA:  Right flank pain since yesterday. Kidney stone suspected. History kidney stones. EXAM: CT ABDOMEN AND PELVIS WITHOUT CONTRAST TECHNIQUE: Multidetector CT imaging of the abdomen and pelvis was performed following the standard protocol without IV contrast. COMPARISON:  05/03/2017 FINDINGS: Lower chest: Clear lung bases. Normal heart size without pericardial or pleural effusion. Hepatobiliary: Normal liver. Normal gallbladder, without biliary ductal dilatation. Pancreas: Normal, without mass or ductal dilatation. Spleen: Normal in size, without focal abnormality. Adrenals/Urinary Tract: Normal adrenal glands. Punctate, right larger than left renal collecting system calculi. No hydronephrosis. Paucity of fat degrades evaluation of the ureters. No convincing evidence of hydroureter or ureteric stone. No bladder calculi. There are vascular calcifications within the pelvis. Stomach/Bowel: Normal stomach, without wall thickening. Colonic stool burden suggests constipation. Appendix not identified on this nondedicated exam. Normal small bowel. Vascular/Lymphatic: Aortic atherosclerosis. Paucity of retroperitoneal fat, degrading evaluation for adenopathy. Reproductive: Posterior uterine fundal fibroid including at 3.4 cm. No adnexal mass. Other: No significant free fluid.  No free intraperitoneal air. Musculoskeletal: Osteopenia.  Convex left lumbar spine curvature.  IMPRESSION: 1. Bilateral nephrolithiasis, without obstructive uropathy. 2.  Possible constipation. 3. Otherwise, low sensitivity exam secondary to paucity of abdominopelvic fat. Appendix not confidently identified. Please note that since 05/03/2017, patient has had a significant decrease in intra-abdominal and pelvic fat. Correlate with excessive weight loss or cachexia. 4. Uterine fibroids. 5.  Aortic Atherosclerosis (ICD10-I70.0). Electronically Signed   By: Abigail Miyamoto M.D.   On: 05/23/2021 18:38    Procedures Procedures   Medications Ordered in ED Medications  sodium chloride 0.9 % bolus 500 mL (has no administration in time range)  0.9 %  sodium chloride infusion (has no administration in time range)  ondansetron (ZOFRAN) injection 4 mg (has no administration in time range)  fentaNYL (SUBLIMAZE) injection 25 mcg (has no administration in time range)    ED Course  I have reviewed the triage vital signs and the nursing notes.  Pertinent labs & imaging results that were available during my care of the patient were reviewed by me and considered in my medical decision making (see chart for details).    MDM Rules/Calculators/A&P                           Patient to ED with flank and abdominal pain, reporting symptoms as similar to previous kidney stones. History of stones requiring lithotripsy. No fever. +N, V.   Patient wait time to see provider prolonged due to census, over 12 hours. When seen she is endorsing continuous pain and nausea. No vomiting while here. Labs significant for mild hypokalemia of 3.1. UA without strong evidence of infection, small hgb. CT renal study shows bilateral renal stones without hydro or visualized ureteral stones. Appendix not visualized. ?constipation. Patient reports normal BM's recently, last one yesterday.   Given location and severity of pain, and essentially inconclusive CT renal w/o CM, feel a contrasted study is warranted. Pain is controlled. Nausea  resolved for now.VSS.  Contrasted CT abd/pel negative. Discussed the significant loss of weight/body fat as relayed on CT results x 2 with the patient. Recommended follow up with PCP to discuss.   She is feeling much better. Eating/drinking. Sitting up in bed, looks comfortable. She is felt appropriate for discharge home. Return precautions discussed.      Final Clinical Impression(s) / ED  Diagnoses Final diagnoses:  None   Abdominal pain Flank pain Hypokalemia   Rx / DC Orders ED Discharge Orders     None        Charlann Lange, PA-C [id number]    Delora Fuel, MD [id number] [phone]

## 2021-06-20 ENCOUNTER — Emergency Department (HOSPITAL_COMMUNITY)
Admission: EM | Admit: 2021-06-20 | Discharge: 2021-06-21 | Disposition: A | Discharge status: Home / Self Care | Payer: Medicare Other | Attending: Emergency Medicine | Admitting: Emergency Medicine

## 2021-06-20 ENCOUNTER — Encounter (HOSPITAL_COMMUNITY): Payer: Self-pay | Admitting: Emergency Medicine

## 2021-06-20 DIAGNOSIS — R531 Weakness: Secondary | ICD-10-CM | POA: Insufficient documentation

## 2021-06-20 DIAGNOSIS — R29898 Other symptoms and signs involving the musculoskeletal system: Secondary | ICD-10-CM

## 2021-06-20 DIAGNOSIS — J45909 Unspecified asthma, uncomplicated: Secondary | ICD-10-CM | POA: Insufficient documentation

## 2021-06-20 DIAGNOSIS — R109 Unspecified abdominal pain: Secondary | ICD-10-CM | POA: Diagnosis not present

## 2021-06-20 DIAGNOSIS — R410 Disorientation, unspecified: Secondary | ICD-10-CM | POA: Insufficient documentation

## 2021-06-20 DIAGNOSIS — J449 Chronic obstructive pulmonary disease, unspecified: Secondary | ICD-10-CM | POA: Diagnosis not present

## 2021-06-20 DIAGNOSIS — F17210 Nicotine dependence, cigarettes, uncomplicated: Secondary | ICD-10-CM | POA: Insufficient documentation

## 2021-06-20 LAB — CBC
HCT: 43.8 % (ref 36.0–46.0)
Hemoglobin: 14.7 g/dL (ref 12.0–15.0)
MCH: 30.9 pg (ref 26.0–34.0)
MCHC: 33.6 g/dL (ref 30.0–36.0)
MCV: 92 fL (ref 80.0–100.0)
Platelets: 193 10*3/uL (ref 150–400)
RBC: 4.76 MIL/uL (ref 3.87–5.11)
RDW: 12.5 % (ref 11.5–15.5)
WBC: 9.6 10*3/uL (ref 4.0–10.5)
nRBC: 0 % (ref 0.0–0.2)

## 2021-06-20 LAB — BASIC METABOLIC PANEL
Anion gap: 7 (ref 5–15)
BUN: 18 mg/dL (ref 8–23)
CO2: 28 mmol/L (ref 22–32)
Calcium: 9.5 mg/dL (ref 8.9–10.3)
Chloride: 102 mmol/L (ref 98–111)
Creatinine, Ser: 0.75 mg/dL (ref 0.44–1.00)
GFR, Estimated: >60 mL/min (ref >60)
Glucose, Bld: 109 mg/dL — ABNORMAL HIGH (ref 70–99)
Potassium: 3 mmol/L — ABNORMAL LOW (ref 3.5–5.1)
Sodium: 137 mmol/L (ref 135–145)

## 2021-06-20 LAB — URINALYSIS, ROUTINE W REFLEX MICROSCOPIC
Bilirubin Urine: NEGATIVE
Glucose, UA: NEGATIVE mg/dL
Hgb urine dipstick: NEGATIVE
Ketones, ur: 5 mg/dL — AB
Leukocytes,Ua: NEGATIVE
Nitrite: NEGATIVE
Protein, ur: NEGATIVE mg/dL
Specific Gravity, Urine: 1.016 (ref 1.005–1.030)
pH: 6 (ref 5.0–8.0)

## 2021-06-20 NOTE — ED Notes (Signed)
Patient asking for pain and nausea meds. Dr. Betsey Holiday notified.

## 2021-06-20 NOTE — ED Triage Notes (Signed)
Per EMS-complaining of left leg pain and lower back pain since this am-thinks she might have a UTI which might be causing her leg weakness

## 2021-06-20 NOTE — ED Provider Notes (Signed)
[place] DEPT Provider Note   CSN: [id number] Arrival date & time: 06/20/21  1722     History Chief Complaint  Patient presents with   Back Pain   Leg Pain    Melanie Caldwell is a 65 y.o. female.  Patient presents to the emergency department with multiple complaints.  Patient reports that she has persistent right flank pain.  She was here several weeks ago with similar.  At that time she was having nausea and vomiting, this has improved but she still has pain.  No dysuria, hematuria.  Patient does report that she has a history of kidney stones.  Patient also complaining of left leg altered sensation.  She noticed this yesterday.  She reports that when she walks her left leg does not feel right.  It seems shaky.  She denies any pain or injury.  She also has noticed increased confusion which has been ongoing for several weeks.      Past Medical History:  Diagnosis Date   ADD (attention deficit disorder)    Anxiety disorder    Asthma    Bulging lumbar disc    COPD (chronic obstructive pulmonary disease) (HCC)    DDD (degenerative disc disease), lumbar    Depression    Fibroid uterus    Gastric ulcer    GERD (gastroesophageal reflux disease)    History of nephrolithiasis    History of UTI    Pyloric stenosis    Recreational drug use    History   Tobacco abuse     Patient Active Problem List   Diagnosis Date Noted   Adjustment disorder with depressed mood 04/19/2018   GERD 10/16/2010   CHEST PAIN UNSPECIFIED 10/16/2010   NAUSEA AND VOMITING 10/16/2010   ABDOMINAL PAIN, EPIGASTRIC 10/16/2010   DIARRHEA 02/12/2009   ASTHMA 10/01/2008   LEG PAIN, BILATERAL 10/01/2008   TOBACCO ABUSE 08/16/2008   SHORTNESS OF BREATH 08/16/2008   CHEST PAIN, ATYPICAL 08/16/2008   RHINOSINUSITIS, ACUTE 07/25/2008   FIBROIDS, UTERUS 07/12/2008   CONSTIPATION 07/12/2008   Abdominal pain, other specified site 07/06/2008    Past Surgical History:   Procedure Laterality Date   ESOPHAGEAL DILATION     EXPLORATION MIDDLE EAR     with revision of left stapedectomy and replacement of prosthesis   FETAL SURGERY FOR CONGENITAL HERNIA     HERNIA REPAIR     LITHOTRIPSY  1985   TONSILLECTOMY       OB History   No obstetric history on file.     Family History  Problem Relation Age of Onset   Coronary artery disease Mother    Hypertension Mother    Diabetes Father    Colon cancer Maternal Grandfather     Social History   Tobacco Use   Smoking status: Every Day    Packs/day: 0.50    Types: Cigarettes   Smokeless tobacco: Never   Tobacco comments:    Patient given counseling sheet to quit smoking; now smoking :hookah with 1 cigarette daily....  Substance Use Topics   Alcohol use: Yes    Comment: hx of heavy Etoh use in the past; now socially   Drug use: No    Home Medications Prior to Admission medications   Medication Sig Start Date End Date Taking? Authorizing Provider  citalopram (CELEXA) 20 MG tablet Take 20 mg by mouth daily.  04/22/17   [provider]  DimenhyDRINATE (DRAMAMINE PO) Take 1 tablet by mouth 2 (two) times  daily as needed (upset stomach).    [provider]  ibuprofen (ADVIL,MOTRIN) 600 MG tablet Take 1 tablet (600 mg total) by mouth every 6 (six) hours as needed. Patient not taking: Reported on 04/19/2018 05/03/17   Providence Lanius A, PA-C  Ibuprofen-Diphenhydramine Cit (ADVIL PM) 200-38 MG TABS Take 1 tablet by mouth at bedtime as needed (sleep).    [provider]  omeprazole (PRILOSEC) 40 MG capsule Take 1 capsule (40 mg total) by mouth daily. Patient not taking: Reported on 04/19/2018 09/02/16   Ladene Artist, MD  ondansetron (ZOFRAN ODT) 4 MG disintegrating tablet Take 1 tablet (4 mg total) by mouth every 8 (eight) hours as needed for nausea or vomiting. 05/24/21   Charlann Lange, PA-C  ondansetron (ZOFRAN) 4 MG tablet take 1 tablet by mouth every 8 hours if needed for nausea  and vomiting Patient not taking: Reported on 04/19/2018 04/05/17   Ladene Artist, MD  potassium chloride (K-DUR) 10 MEQ tablet Take 1 tablet (10 mEq total) by mouth daily. Patient not taking: Reported on 04/19/2018 05/03/17   Providence Lanius A, PA-C  potassium chloride SA (KLOR-CON) 20 MEQ tablet Take 1 tablet (20 mEq total) by mouth 2 (two) times daily. 05/24/21   Charlann Lange, PA-C  promethazine (PHENERGAN) 25 MG tablet take 1 tablet by mouth twice a day if needed for nausea and vomiting Patient not taking: Reported on 04/19/2018 01/15/17   Ladene Artist, MD  rosuvastatin (CRESTOR) 10 MG tablet Take 10 mg by mouth 2 (two) times a week.    [provider]  traZODone (DESYREL) 50 MG tablet Take 25-50 mg by mouth QHS 04/30/17   [provider]    Allergies    Adhesive [tape] and Ceftin [cefuroxime axetil]  Review of Systems   Review of Systems  Genitourinary:  Positive for flank pain.  Neurological:  Positive for weakness.  Psychiatric/Behavioral:  Positive for confusion.   All other systems reviewed and are negative.  Physical Exam Updated Vital Signs BP 105/66 (BP Location: Left Arm)   Pulse 69   Temp 98.4 F (36.9 C) (Oral)   Resp 15   SpO2 95%   Physical Exam Vitals and nursing note reviewed.  Constitutional:      General: She is not in acute distress.    Appearance: Normal appearance. She is well-developed.  HENT:     Head: Normocephalic and atraumatic.     Right Ear: Hearing normal.     Left Ear: Hearing normal.     Nose: Nose normal.  Eyes:     Conjunctiva/sclera: Conjunctivae normal.     Pupils: Pupils are equal, round, and reactive to light.  Cardiovascular:     Rate and Rhythm: Regular rhythm.     Heart sounds: S1 normal and S2 normal. No murmur heard.   No friction rub. No gallop.  Pulmonary:     Effort: Pulmonary effort is normal. No respiratory distress.     Breath sounds: Normal breath sounds.  Chest:     Chest wall: No tenderness.   Abdominal:     General: Bowel sounds are normal.     Palpations: Abdomen is soft.     Tenderness: There is no abdominal tenderness. There is no guarding or rebound. Negative signs include Murphy's sign and McBurney's sign.     Hernia: No hernia is present.  Musculoskeletal:        General: Normal range of motion.     Cervical back: Normal range of  motion and neck supple.  Skin:    General: Skin is warm and dry.     Findings: No rash.  Neurological:     Mental Status: She is alert and oriented to person, place, and time.     GCS: GCS eye subscore is 4. GCS verbal subscore is 5. GCS motor subscore is 6.     Cranial Nerves: No cranial nerve deficit.     Sensory: No sensory deficit.     Coordination: Coordination normal.     Comments: Lower extremity strength is equal.  She is able to lift both legs off the bed against gravity and hold them up, but both have some tremor and shaking with effort.  Psychiatric:        Speech: Speech normal.        Behavior: Behavior normal.        Thought Content: Thought content normal.    ED Results / Procedures / Treatments   Labs (all labs ordered are listed, but only abnormal results are displayed) Labs Reviewed  URINALYSIS, ROUTINE W REFLEX MICROSCOPIC - Abnormal; Notable for the following components:      Result Value   Ketones, ur 5 (*)    All other components within normal limits  BASIC METABOLIC PANEL - Abnormal; Notable for the following components:   Potassium 3.0 (*)    Glucose, Bld 109 (*)    All other components within normal limits  CBC    EKG None  Radiology CT HEAD WO CONTRAST (5MM)  Result Date: 06/21/2021 CLINICAL DATA:  Altered mental status, leg weakness EXAM: CT HEAD WITHOUT CONTRAST TECHNIQUE: Contiguous axial images were obtained from the base of the skull through the vertex without intravenous contrast. COMPARISON:  None. FINDINGS: Brain: No evidence of acute infarction, hemorrhage, hydrocephalus, extra-axial  collection or mass lesion/mass effect. Vascular: Intracranial atherosclerosis. Skull: Normal. Negative for fracture or focal lesion. Sinuses/Orbits: The visualized paranasal sinuses are essentially clear. The mastoid air cells are unopacified. Other: None. IMPRESSION: Normal head CT. Electronically Signed   By: Julian Hy M.D.   On: 06/21/2021 01:43    Procedures Procedures   Medications Ordered in ED Medications  oxyCODONE-acetaminophen (PERCOCET/ROXICET) 5-325 MG per tablet 1 tablet (1 tablet Oral Given 06/21/21 0342)  LORazepam (ATIVAN) injection 1 mg (has no administration in time range)  oxyCODONE-acetaminophen (PERCOCET/ROXICET) 5-325 MG per tablet 1 tablet (1 tablet Oral Given 06/21/21 0008)  ondansetron (ZOFRAN-ODT) disintegrating tablet 8 mg (8 mg Oral Given 06/21/21 0008)    ED Course  I have reviewed the triage vital signs and the nursing notes.  Pertinent labs & imaging results that were available during my care of the patient were reviewed by me and considered in my medical decision making (see chart for details).    MDM Rules/Calculators/A&P                           Patient presents to the emergency department for evaluation of multiple problems.  Patient with complaints of right flank pain.  Patient was seen 1 month ago with acute nausea and vomiting and pain on the right side.  She had both a stone study followed by a formal abdominal and pelvis CAT scan at that time.  There were punctate stones noted, no other abnormality.  She is not experiencing any abdominal tenderness.  Does not require repeat imaging.  Although she could have passed one of the punctate stones, urinalysis does not show any  hematuria and there are no signs of infection.  Likely musculoskeletal back pain.  There is, however, no midline tenderness.  Patient complaining of confusion and left leg weakness.  She is concerned that she has had a stroke.  No left upper extremity involvement.  Left lower  extremity exam, no objective findings.  Strength equal in left lower and right lower extremity.  Suspect that her lower extremity strength problems are chronic.  No other signs of acute stroke.  No signs of cauda equina syndrome.  Since the left leg numbness started 24 hours ago, no urgency for work-up but will require imaging.  Will perform MRI.  While she has had MRI, will also image lumbar spine to rule out lumbar etiology of her leg symptoms.  Will sign to the oncoming ER physician to follow-up on images.  Final Clinical Impression(s) / ED Diagnoses Final diagnoses:  Weakness of left lower extremity    Rx / DC Orders ED Discharge Orders     None        Hermes Wafer, Gwenyth Allegra, MD 06/21/21 0710

## 2021-06-21 ENCOUNTER — Emergency Department (HOSPITAL_COMMUNITY): Payer: Medicare Other

## 2021-06-21 IMAGING — MR MR HEAD W/O CM
10 series · 48 of 48 positions shown · non-contrast
Comparison: Head CT [3I] hours today.

CLINICAL DATA: 64-year-old female with increased confusion.

EXAM:
MRI HEAD WITHOUT CONTRAST
TECHNIQUE: Multiplanar, multiecho pulse sequences of the brain and surrounding
structures were obtained without intravenous contrast.

[Series 5: DWI · axial · 3.0mm · 1.36mm/px · z∈[-62,+90]mm · 9 of 103 slices shown (1 of 2)]
[im 1/103]
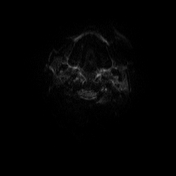
[im 13/103]
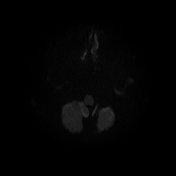
[im 26/103]
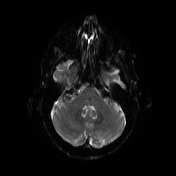
[im 39/103]
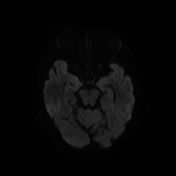
[im 52/103]
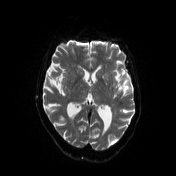
[im 64/103]
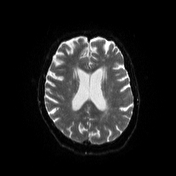
[im 77/103]
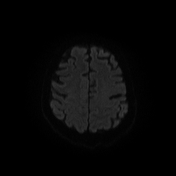
[im 90/103]
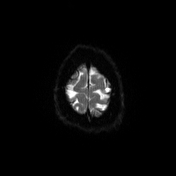
[im 103/103]
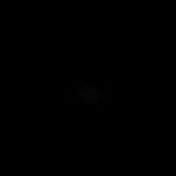

[Series 6: DWI · axial · 3.0mm · 1.36mm/px · z∈[-62,+90]mm · 4 of 52 slices shown (2 of 2)]
[im 1/52]
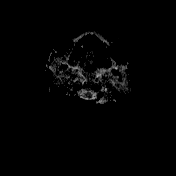
[im 18/52]
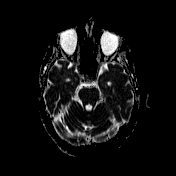
[im 35/52]
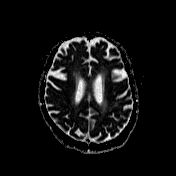
[im 52/52]
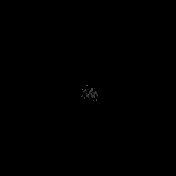

[Series 7: T1 · sagittal · 5.0mm · 0.75mm/px · 2 of 24 slices shown (1 of 2)]
[im 1/24]
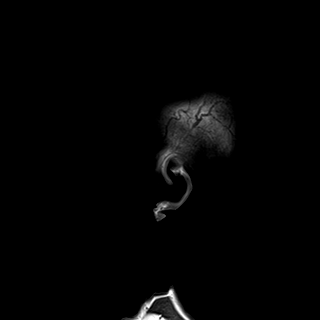
[im 24/24]
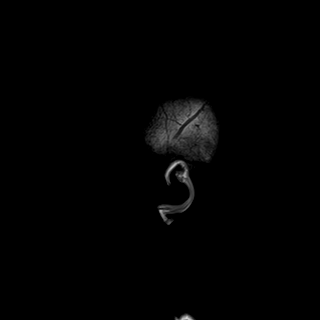

[Series 8: T2 · axial · 5.0mm · 0.62mm/px · z∈[-61,+87]mm · 2 of 24 slices shown (1 of 2)]
[im 1/24]
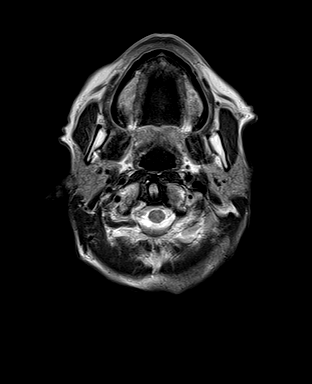
[im 24/24]
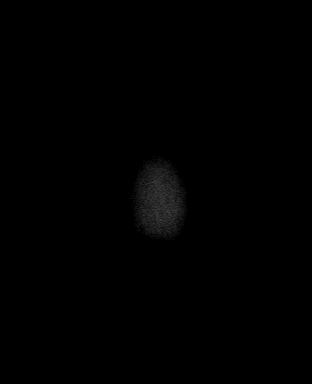

[Series 9: swi_images · axial · 3.0mm · 0.75mm/px · z∈[-63,+89]mm · 4 of 52 slices shown]
[im 1/52]
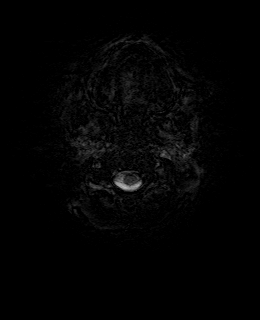
[im 18/52]
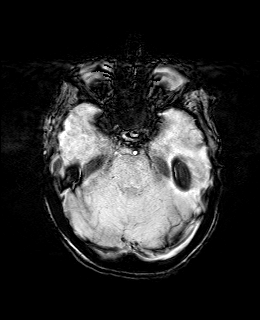
[im 35/52]
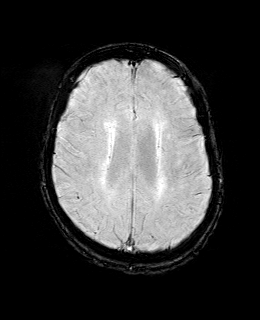
[im 52/52]
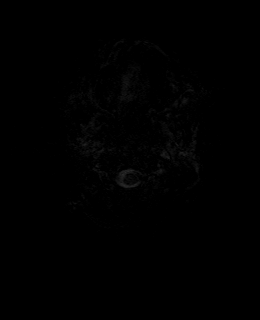

[Series 11: FLAIR · axial · 3.0mm · 0.75mm/px · z∈[-63,+89]mm · 4 of 52 slices shown]
[im 1/52]
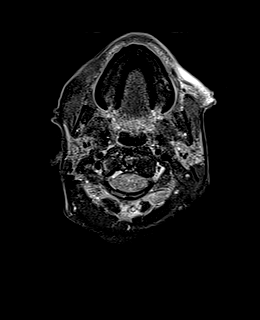
[im 18/52]
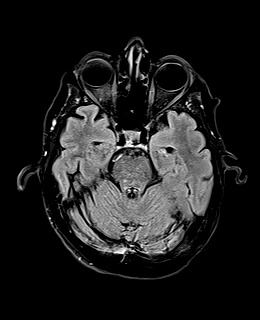
[im 35/52]
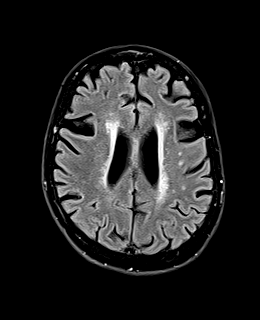
[im 52/52]
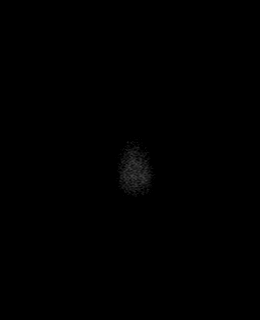

[Series 12: T1 · axial · 1.0mm · 0.94mm/px · z∈[-69,+89]mm · 13 of 160 slices shown (2 of 2)]
[im 1/160]
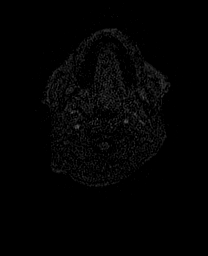
[im 14/160]
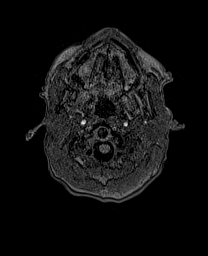
[im 27/160]
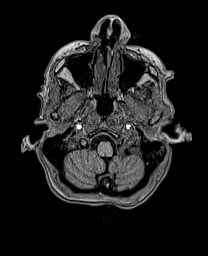
[im 40/160]
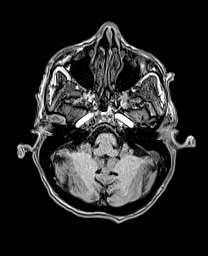
[im 54/160]
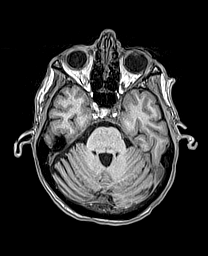
[im 67/160]
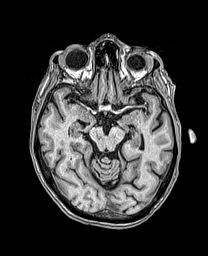
[im 80/160]
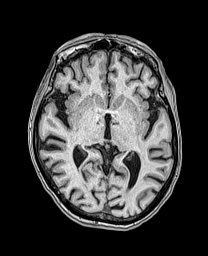
[im 93/160]
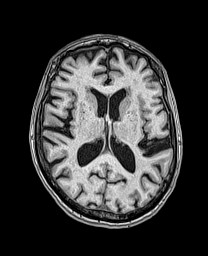
[im 107/160]
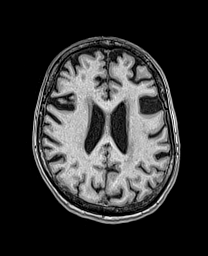
[im 120/160]
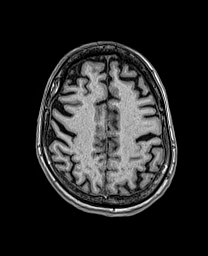
[im 133/160]
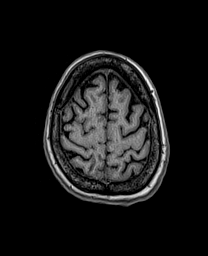
[im 146/160]
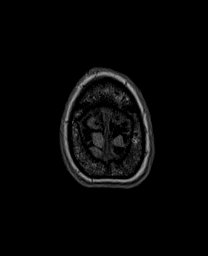
[im 160/160]
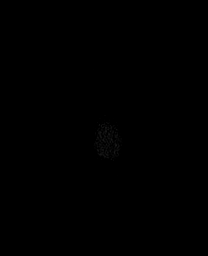

[Series 13: cor dwi_tracew · coronal · 5.0mm · 1.53mm/px · 5 of 56 slices shown]
[im 1/56]
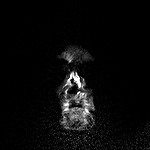
[im 14/56]
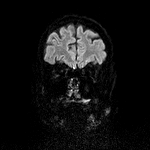
[im 28/56]
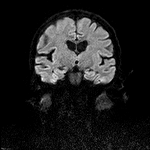
[im 42/56]
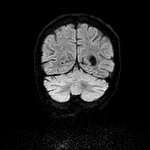
[im 56/56]
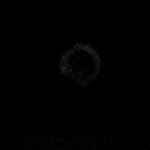

[Series 14: cor dwi_adc · coronal · 5.0mm · 1.53mm/px · 2 of 28 slices shown]
[im 1/28]
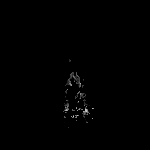
[im 28/28]
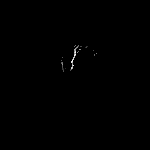

[Series 15: T2 · coronal · 5.0mm · 0.57mm/px · 3 of 36 slices shown (2 of 2)]
[im 1/36]
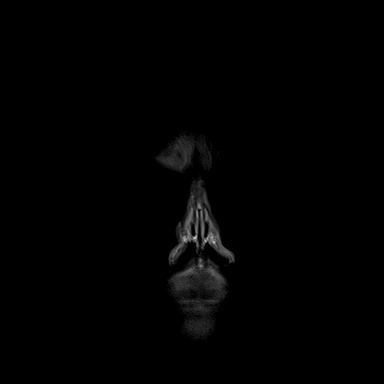
[im 18/36]
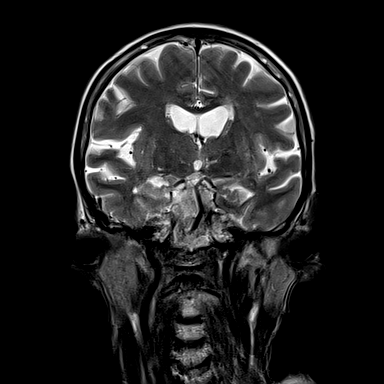
[im 36/36]
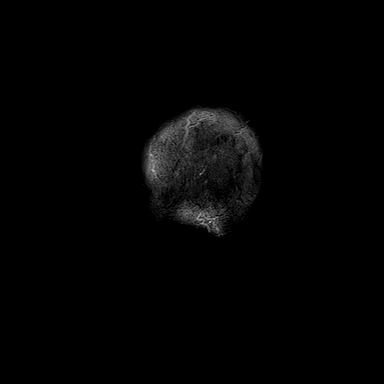

[48 of 48 positions shown; findings below may reference images not displayed]

FINDINGS: Brain: No restricted diffusion to suggest acute infarction. No
midline shift, mass effect, evidence of mass lesion,
ventriculomegaly, extra-axial collection or acute intracranial
hemorrhage. Cervicomedullary junction and pituitary are within
normal limits.

Patchy and scattered bilateral cerebral white matter T2 and FLAIR
hyperintensity, with similar signal heterogeneity in the pons. This
is moderate for age but nonspecific. No cortical encephalomalacia or
chronic cerebral blood products identified. Deep gray matter nuclei
and cerebellum are within normal limits for age.

Vascular: Major intracranial vascular flow voids are preserved. The
right vertebral artery appears dominant.

Skull and upper cervical spine: Lower cervical disc and endplate
degeneration with up to mild spinal stenosis. Visualized bone marrow
signal is within normal limits.

Sinuses/Orbits: Negative orbits. Paranasal sinuses and mastoids are
stable and well aerated.

Other: Visible internal auditory structures appear normal. Negative
visible scalp and face.
IMPRESSION: 1. No acute intracranial abnormality.
2. Moderate for age signal changes in the bilateral cerebral white
matter and pons, nonspecific but most commonly due to chronic small
vessel disease.

## 2021-06-21 IMAGING — CT CT HEAD W/O CM
3 series · 15 of 45 positions shown, 18 images · non-contrast
Comparison: None.

CLINICAL DATA: Altered mental status, leg weakness

EXAM:
CT HEAD WITHOUT CONTRAST
TECHNIQUE: Contiguous axial images were obtained from the base of the skull
through the vertex without intravenous contrast.

[Series 2: head wo · axial · 0.41mm/px · z∈[-147,-32]mm · 9 of 28 slices shown, 12 images]
[im 3/28  brain]
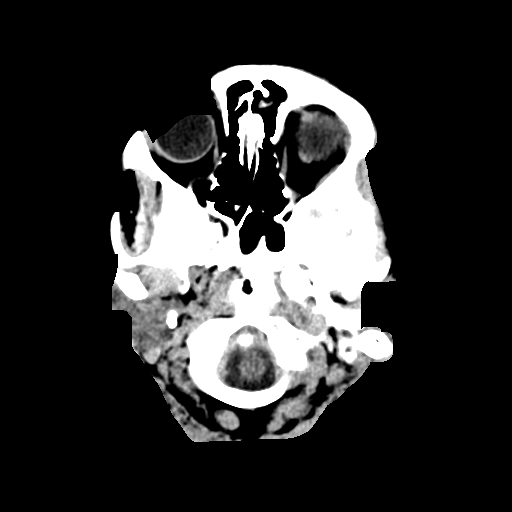
[im 3/28  bone]
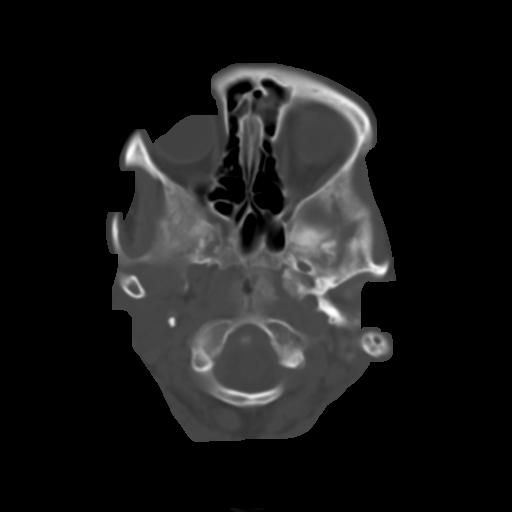
[im 6/28  brain]
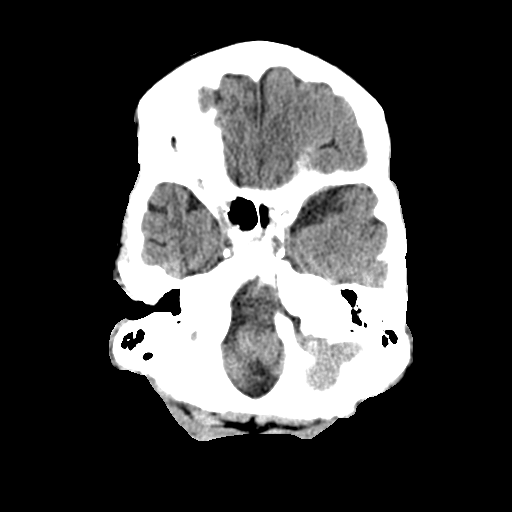
[im 9/28  brain]
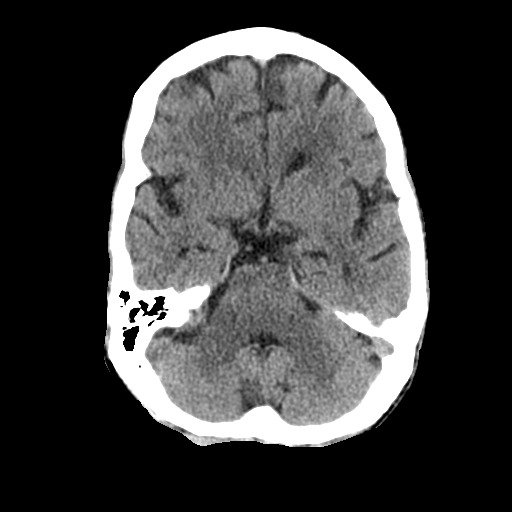
[im 12/28  brain]
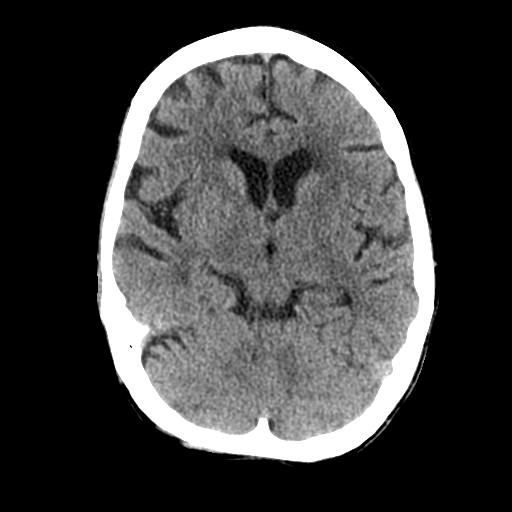
[im 15/28  brain]
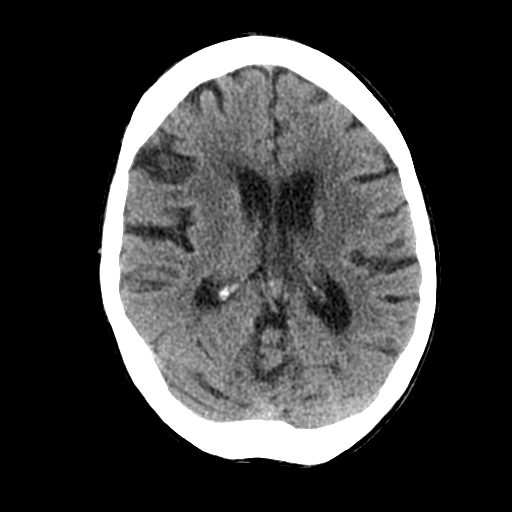
[im 15/28  bone]
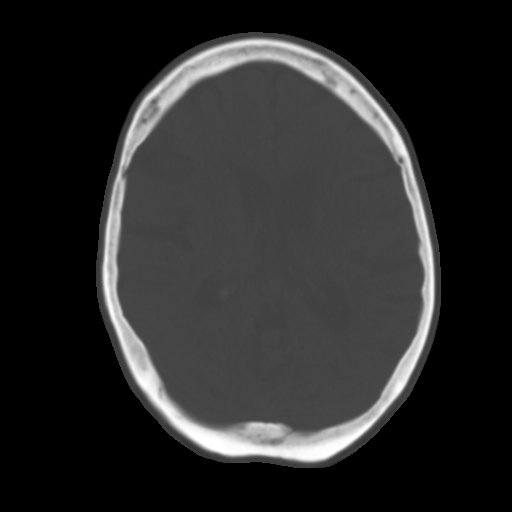
[im 17/28  brain]
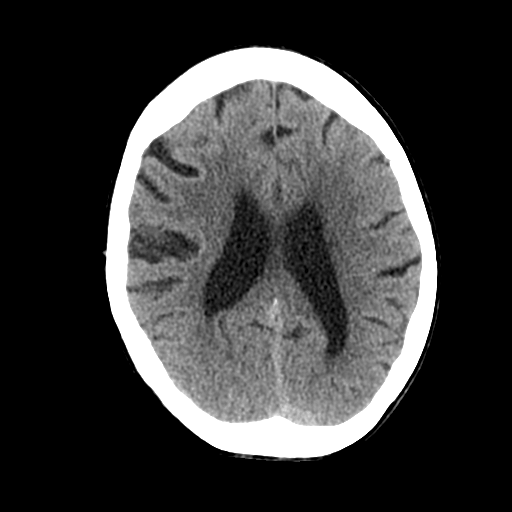
[im 20/28  brain]
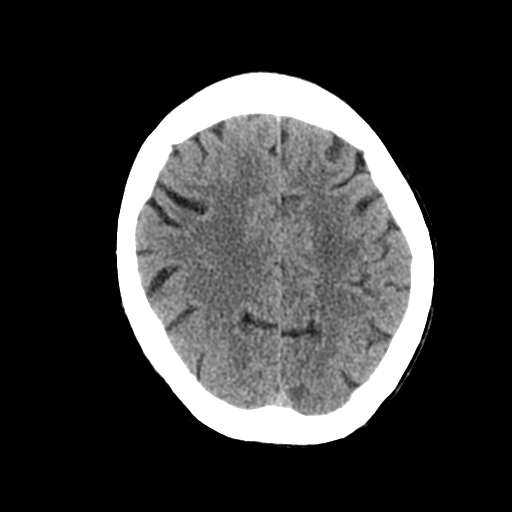
[im 23/28  brain]
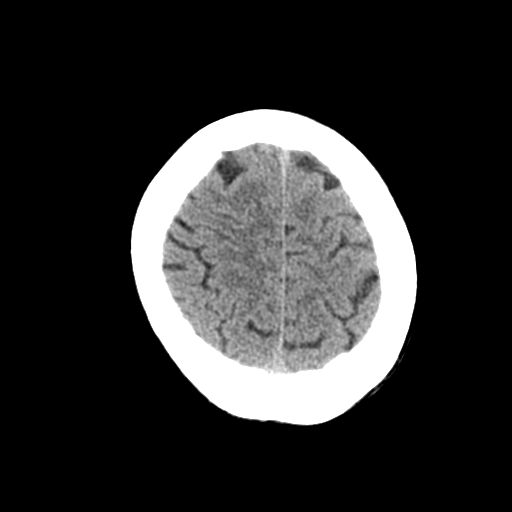
[im 26/28  brain]
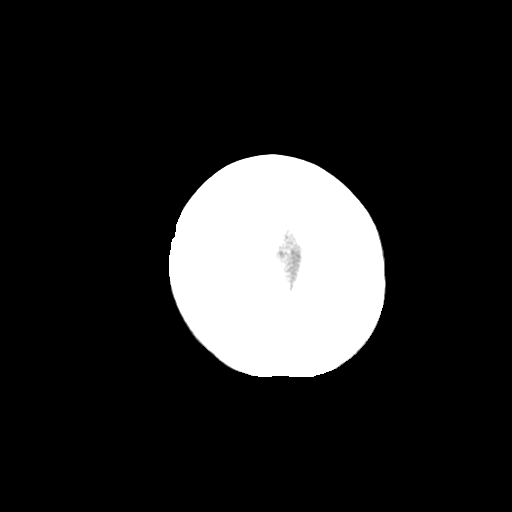
[im 26/28  bone]
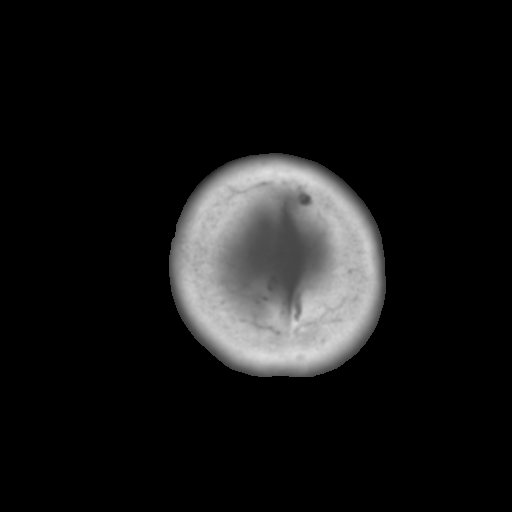

[Series 4: coronal soft tissue · coronal · 0.29mm/px · 3 of 69 slices shown]
[im 23/69  brain]
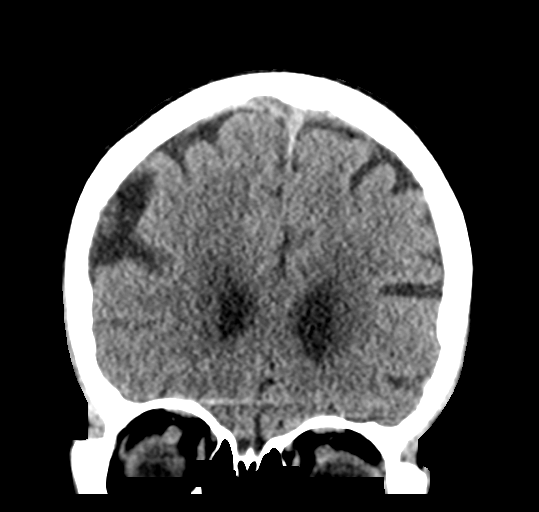
[im 31/69  brain]
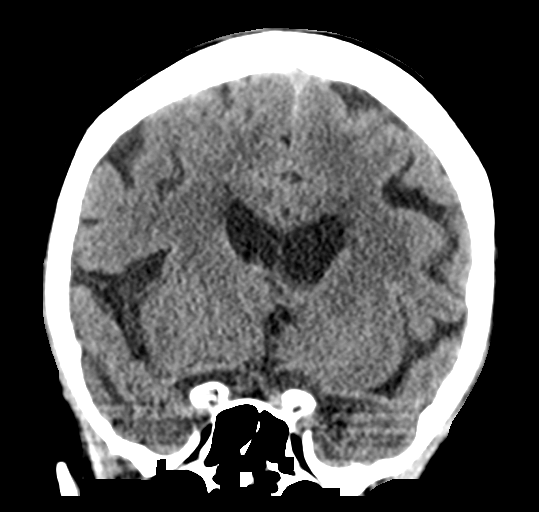
[im 38/69  brain]
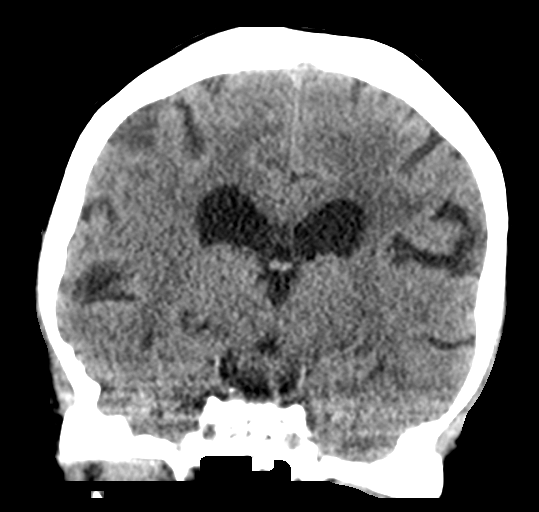

[Series 5: sagittal soft tissue · sagittal · 0.32mm/px · 3 of 53 slices shown]
[im 18/53  brain]
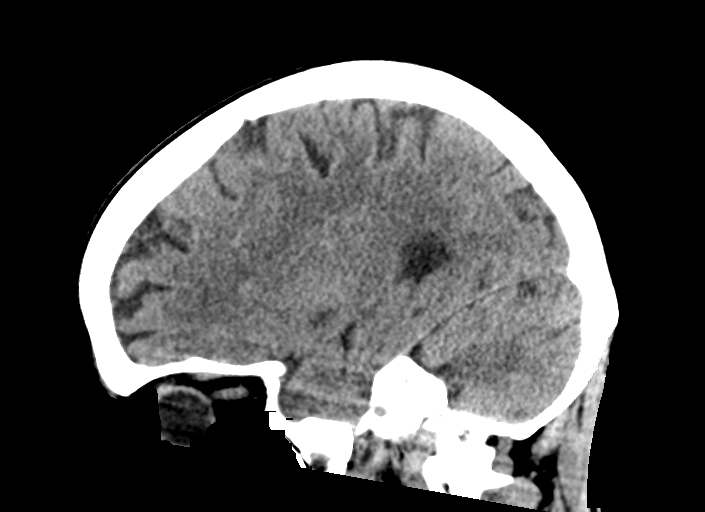
[im 27/53  brain]
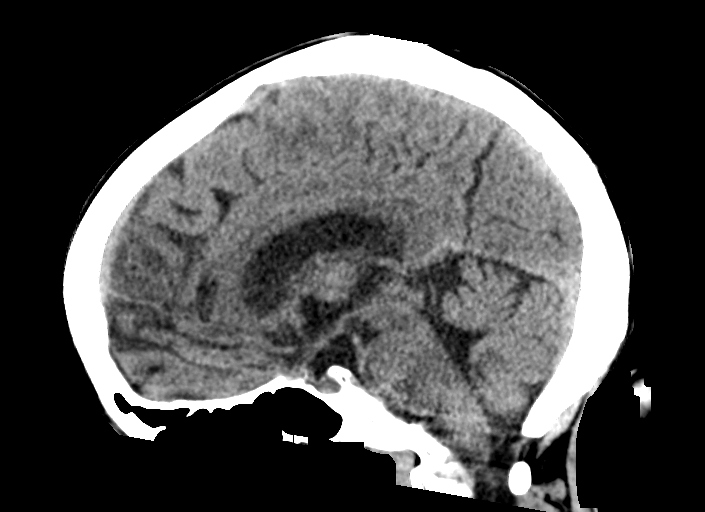
[im 35/53  brain]
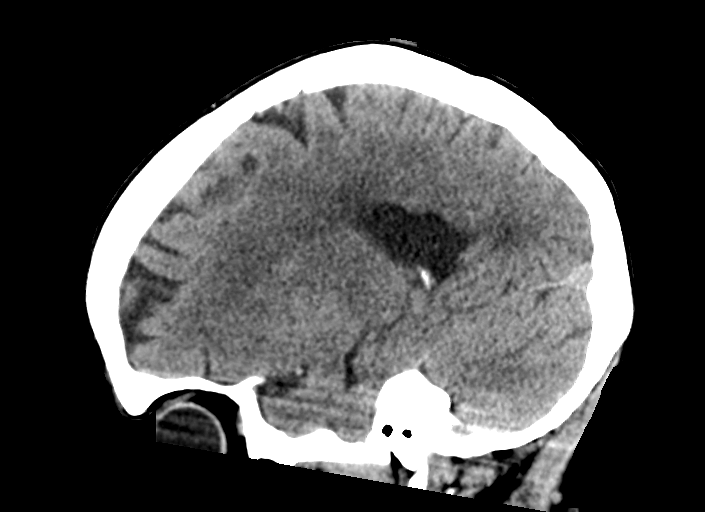

[15 of 45 positions shown; findings below may reference images not displayed]

FINDINGS: Brain: No evidence of acute infarction, hemorrhage, hydrocephalus,
extra-axial collection or mass lesion/mass effect.

Vascular: Intracranial atherosclerosis.

Skull: Normal. Negative for fracture or focal lesion.

Sinuses/Orbits: The visualized paranasal sinuses are essentially
clear. The mastoid air cells are unopacified.

Other: None.
IMPRESSION: Normal head CT.

## 2021-06-21 IMAGING — MR MR LUMBAR SPINE W/O CM
5 series · 30 of 48 positions shown · non-contrast
Comparison: CT Abdomen and Pelvis [DATE].

Chest radiographs [DATE].

CLINICAL DATA: 64-year-old female with increased confusion. Low
back pain, right flank pain, altered sensation in the left leg.

EXAM:
MRI LUMBAR SPINE WITHOUT CONTRAST
TECHNIQUE: Multiplanar, multisequence MR imaging of the lumbar spine was
performed. No intravenous contrast was administered.

[Series 7: T1 · sagittal · 4.0mm · 0.81mm/px · 6 of 17 slices shown (1 of 2)]
[im 1/17]
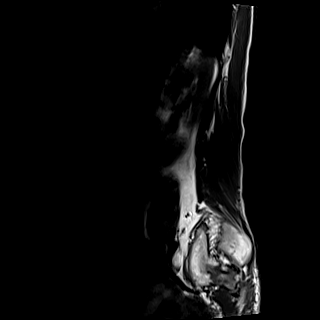
[im 4/17]
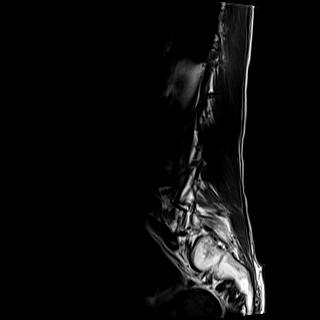
[im 7/17]
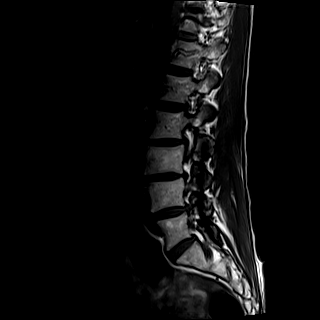
[im 10/17]
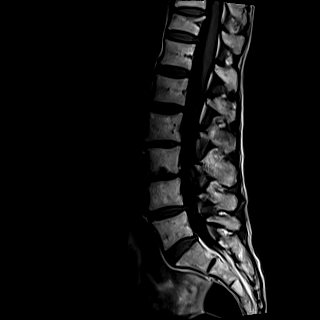
[im 13/17]
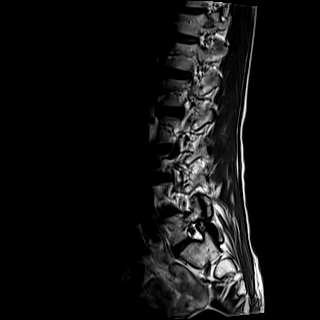
[im 17/17]
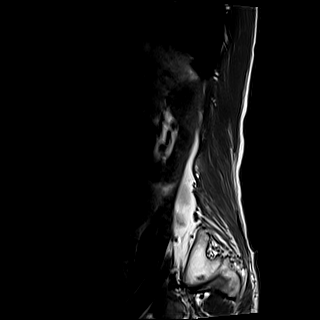

[Series 8: STIR · sagittal · 4.0mm · 0.51mm/px · 1 of 17 slices shown]
[im 1/17]
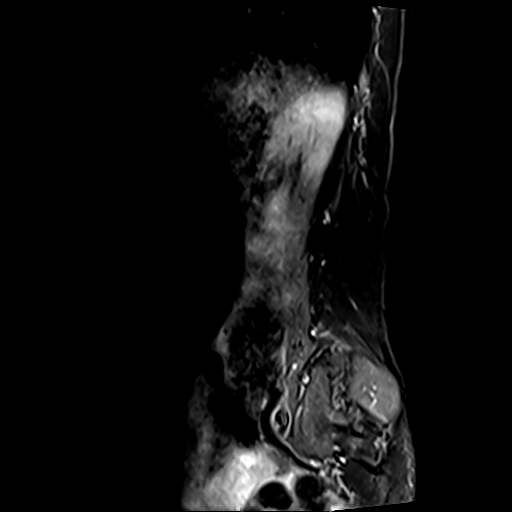

[Series 9: T2 · sagittal · 4.0mm · 0.81mm/px · 7 of 17 slices shown (1 of 2)]
[im 1/17]
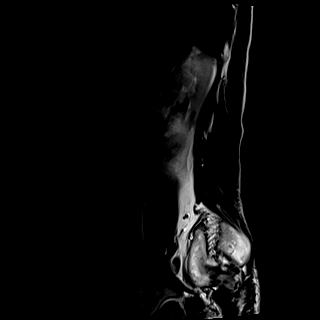
[im 3/17]
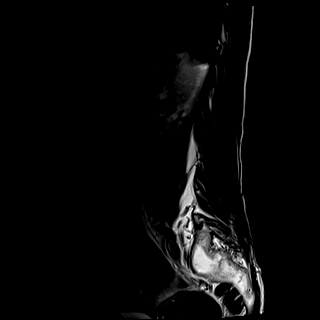
[im 6/17]
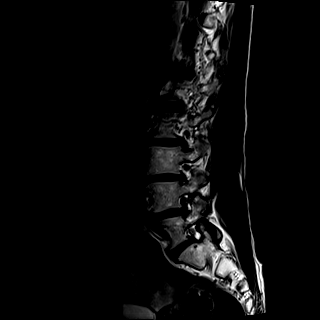
[im 9/17]
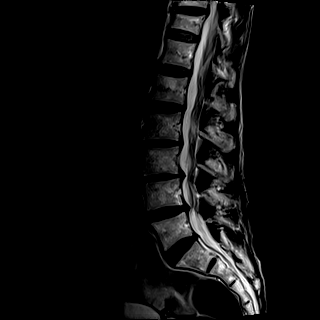
[im 11/17]
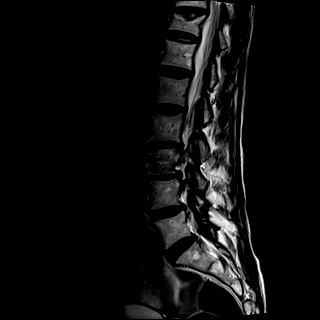
[im 14/17]
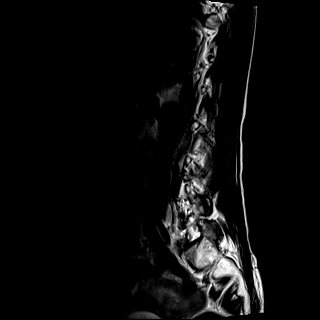
[im 17/17]
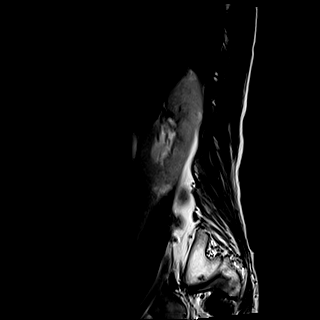

[Series 11: T2 · axial · 4.0mm · 0.62mm/px · z∈[-561,-389]mm · 8 of 33 slices shown (2 of 2)]
[im 1/33]
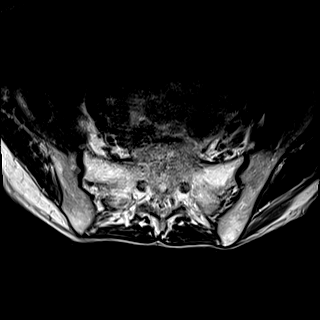
[im 5/33]
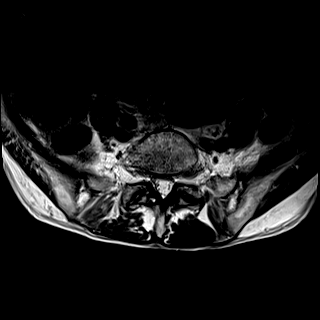
[im 10/33]
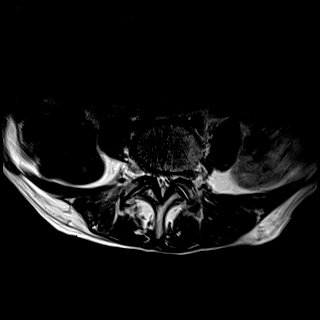
[im 15/33]
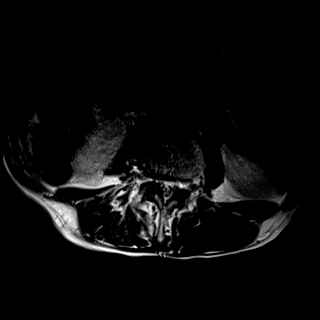
[im 18/33]
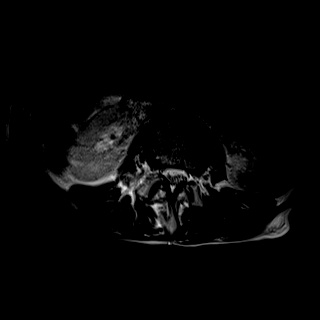
[im 23/33]
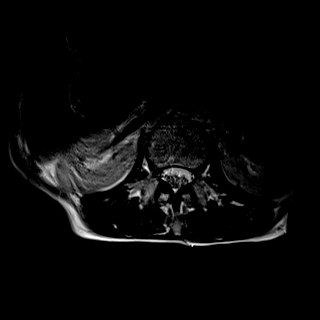
[im 28/33]
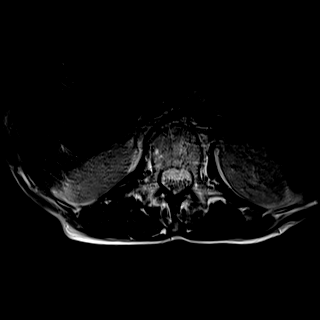
[im 33/33]
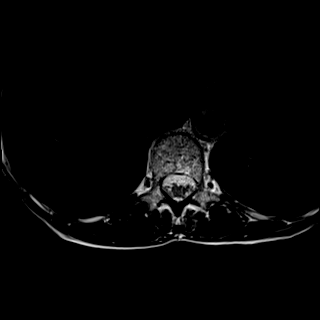

[Series 12: T1 · axial · 4.0mm · 0.39mm/px · z∈[-561,-389]mm · 8 of 33 slices shown (2 of 2)]
[im 1/33]
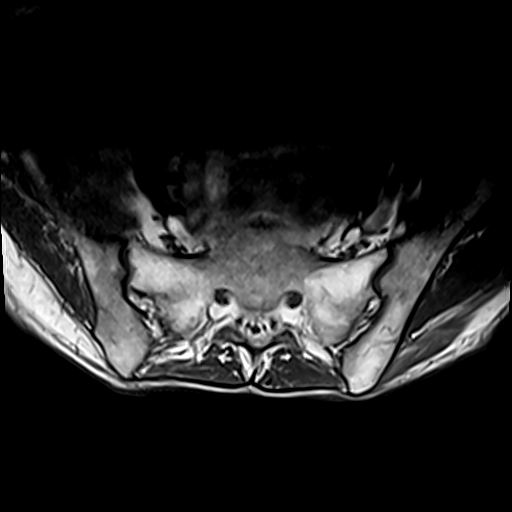
[im 5/33]
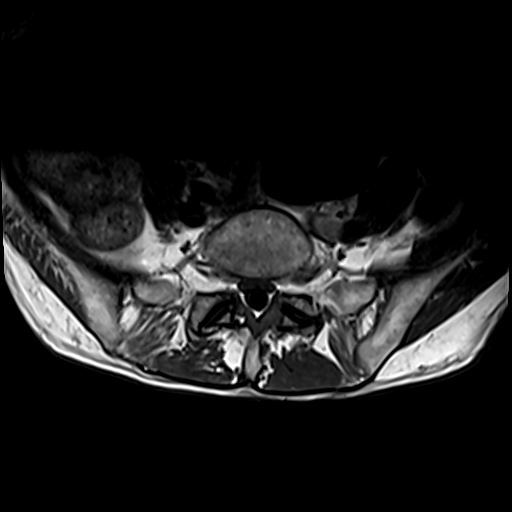
[im 10/33]
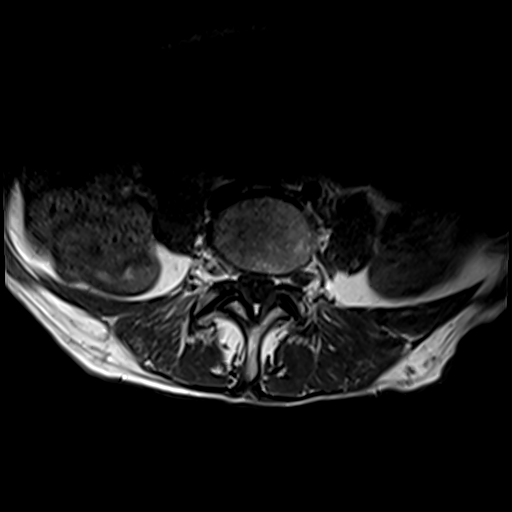
[im 15/33]
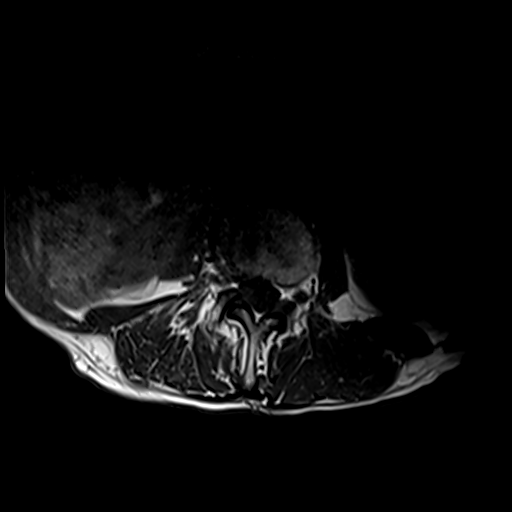
[im 18/33]
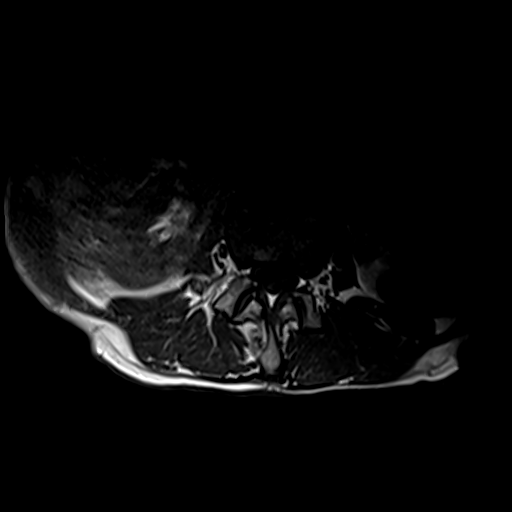
[im 23/33]
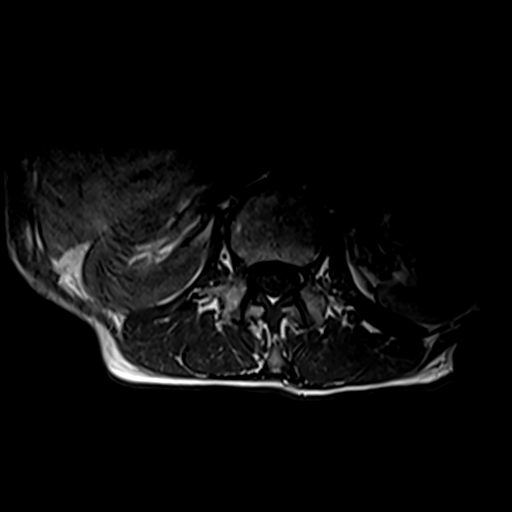
[im 28/33]
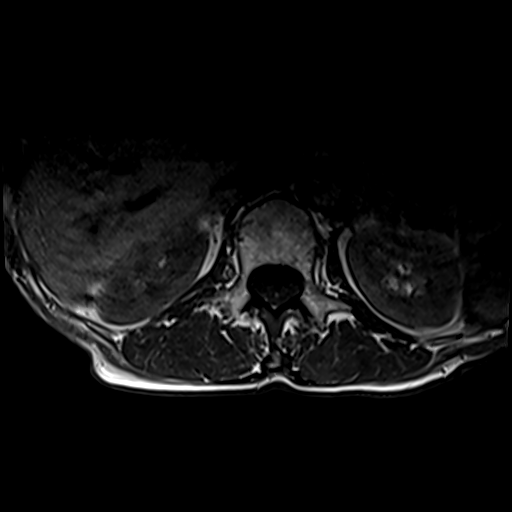
[im 33/33]
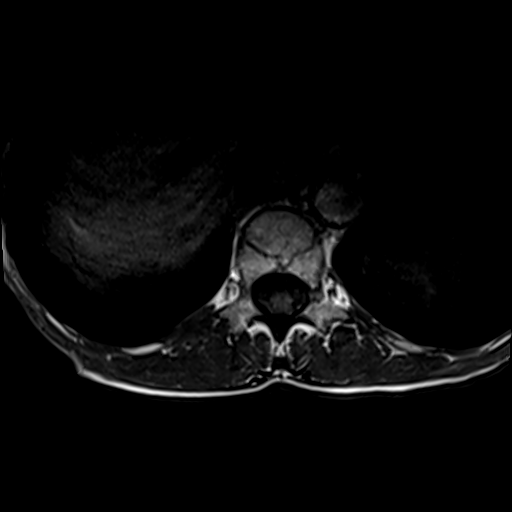

[30 of 48 positions shown; findings below may reference images not displayed]

FINDINGS: Segmentation: Full size 12 rib pairs on the [PR] radiographs. Six
lumbarized vertebrae, therefore fully lumbarized S1 level.
Correlation with radiographs is recommended prior to any operative
intervention.

Alignment: Stable lumbar lordosis since [REDACTED]. Subtle anterolisthesis
of L4 on L5. Mild levoconvex lumbar scoliosis.

Vertebrae: Mild chronic T12 superior endplate compression (series 8,
image 8). Mild retropulsion. Small subacute or chronic appearing L4
inferior endplate Schmorl's node (same image). Normal background
bone marrow signal with no acute osseous abnormality identified.
Intact visible sacrum and SI joints.

Conus medullaris and cauda equina: Conus extends to the L1-L2 level.
No lower spinal cord or conus signal abnormality.

Paraspinal and other soft tissues: Stable since [REDACTED].

Disc levels:

T11-T12: Mild retropulsion of the T12 posterosuperior endplate but
no stenosis.

T12-L1:  Negative.

L1-L2:  Mild ligament flavum hypertrophy.  No stenosis.

L2-L3:  Minor disc space loss and disc bulging.  No stenosis.

L3-L4: Disc desiccation and disc space loss. Mild circumferential
disc bulge and posterior element hypertrophy. Mild left L3 neural
foraminal stenosis.

L4-L5: Disc space loss with circumferential disc bulge. Moderate
posterior element hypertrophy. Mild to moderate spinal stenosis. No
convincing lateral recess stenosis. Mild L4 foraminal stenosis is
greater on the right.

L5-S1: Circumferential disc bulge with moderate posterior element
hypertrophy. Mild to moderate spinal stenosis. Moderate left L5
neural foraminal stenosis in part related to foraminal disc (series
9 image 12).

S1-S2: Lumbarized with full size disc. Moderate facet and ligament
flavum hypertrophy. But no spinal or convincing foraminal stenosis.
IMPRESSION: 1. Transitional lumbosacral anatomy with fully lumbarized S1 level,
full size S1-S2 disc. Correlation with radiographs is recommended
prior to any operative intervention.
2. Degenerative and multifactorial mild to moderate spinal stenosis
at both L4-L5 and L5-S1. Up to moderate left L5 neural foraminal
stenosis.
3. Mild chronic T12 superior endplate compression. Small subacute or
chronic appearing L4 inferior endplate Schmorl's node.

## 2021-06-21 MED ORDER — ONDANSETRON HCL 4 MG PO TABS: 4.0000 mg | ORAL_TABLET | Freq: Four times a day (QID) | ORAL | 0 refills | Status: DC

## 2021-06-21 MED ORDER — ONDANSETRON HCL 4 MG PO TABS
4.0000 mg | ORAL_TABLET | Freq: Four times a day (QID) | ORAL | 0 refills | Status: DC
Start: 2021-06-21 — End: 2022-06-30

## 2021-06-21 MED ORDER — ONDANSETRON 8 MG PO TBDP
8.0000 mg | ORAL_TABLET | Freq: Once | ORAL | Status: AC
  Administered 2021-06-21: 8 mg via ORAL
  Filled 2021-06-21: qty 1

## 2021-06-21 MED ORDER — LORAZEPAM 2 MG/ML IJ SOLN: 1.0000 mg | Freq: Once | INTRAMUSCULAR | Status: DC | PRN

## 2021-06-21 MED ORDER — OXYCODONE-ACETAMINOPHEN 5-325 MG PO TABS
1.0000 | ORAL_TABLET | Freq: Once | ORAL | Status: AC
  Administered 2021-06-21: 1 via ORAL
  Filled 2021-06-21: qty 1

## 2021-06-21 MED ORDER — ONDANSETRON 4 MG PO TBDP
4.0000 mg | ORAL_TABLET | Freq: Once | ORAL | Status: AC
  Administered 2021-06-21: 4 mg via ORAL
  Filled 2021-06-21: qty 1

## 2021-06-21 MED ORDER — OXYCODONE-ACETAMINOPHEN 5-325 MG PO TABS
1.0000 | ORAL_TABLET | ORAL | Status: DC | PRN
Start: 2021-06-21 — End: 2021-06-21
  Administered 2021-06-21 (×2): 1 via ORAL
  Filled 2021-06-21 (×2): qty 1

## 2021-06-21 NOTE — ED Notes (Signed)
Ambulatory to restroom. States she still feels like her left leg does not feel like normal

## 2021-06-21 NOTE — ED Notes (Signed)
To MRI via wheelchair.

## 2021-06-21 NOTE — Discharge Instructions (Addendum)
Images are unremarkable.  Follow-up with your primary care doctor.

## 2021-06-21 NOTE — ED Provider Notes (Signed)
Signed out to me at 7 AM with patient awaiting MRI of brain and low back to rule out stroke, low back process.  Some subjective weakness in the left lower leg per patient.  Has been able to ambulate without much issue.  MRI shows no stroke.  MRI of the low back overall unremarkable.  Some mild stenosis.  Overall she appears comfortable on exam.  Has been able to ambulate without much difficulty.  Recommend continued use of Tylenol and ibuprofen at home.  Recommend follow-up primary care doctor.  Discharged in good condition.   Lennice Sites, DO 06/21/21 [phone]

## 2021-06-21 NOTE — ED Notes (Signed)
Patient given drink and snack.

## 2021-06-21 NOTE — ED Notes (Signed)
This nurse Deretha Emory, MD for patient who has additional questions and was requesting nausea medications to go home with. Patient is up for discharge and has ride coming to get her. Denies other needs

## 2021-06-21 NOTE — ED Notes (Signed)
Patient was given graham crackers and a soda.

## 2022-06-20 ENCOUNTER — Emergency Department (HOSPITAL_COMMUNITY)
Admission: EM | Admit: 2022-06-20 | Discharge: 2022-06-20 | Disposition: A | Discharge status: Home / Self Care | Payer: Medicare Other | Attending: Emergency Medicine | Admitting: Emergency Medicine

## 2022-06-20 ENCOUNTER — Emergency Department (HOSPITAL_COMMUNITY): Payer: Medicare Other

## 2022-06-20 ENCOUNTER — Other Ambulatory Visit: Payer: Self-pay

## 2022-06-20 ENCOUNTER — Encounter (HOSPITAL_COMMUNITY): Payer: Self-pay

## 2022-06-20 DIAGNOSIS — E876 Hypokalemia: Secondary | ICD-10-CM | POA: Insufficient documentation

## 2022-06-20 DIAGNOSIS — F419 Anxiety disorder, unspecified: Secondary | ICD-10-CM | POA: Diagnosis present

## 2022-06-20 DIAGNOSIS — Z79899 Other long term (current) drug therapy: Secondary | ICD-10-CM | POA: Insufficient documentation

## 2022-06-20 DIAGNOSIS — R918 Other nonspecific abnormal finding of lung field: Secondary | ICD-10-CM | POA: Insufficient documentation

## 2022-06-20 DIAGNOSIS — R5383 Other fatigue: Secondary | ICD-10-CM | POA: Insufficient documentation

## 2022-06-20 DIAGNOSIS — J449 Chronic obstructive pulmonary disease, unspecified: Secondary | ICD-10-CM | POA: Diagnosis not present

## 2022-06-20 DIAGNOSIS — R110 Nausea: Secondary | ICD-10-CM | POA: Diagnosis not present

## 2022-06-20 LAB — URINALYSIS, ROUTINE W REFLEX MICROSCOPIC
Bilirubin Urine: NEGATIVE
Glucose, UA: NEGATIVE mg/dL
Hgb urine dipstick: NEGATIVE
Ketones, ur: NEGATIVE mg/dL
Leukocytes,Ua: NEGATIVE
Nitrite: NEGATIVE
Protein, ur: NEGATIVE mg/dL
Specific Gravity, Urine: 1.015 (ref 1.005–1.030)
pH: 7 (ref 5.0–8.0)

## 2022-06-20 LAB — COMPREHENSIVE METABOLIC PANEL
ALT: 12 U/L (ref 0–44)
AST: 16 U/L (ref 15–41)
Albumin: 3.7 g/dL (ref 3.5–5.0)
Alkaline Phosphatase: 47 U/L (ref 38–126)
Anion gap: 4 — ABNORMAL LOW (ref 5–15)
BUN: 11 mg/dL (ref 8–23)
CO2: 29 mmol/L (ref 22–32)
Calcium: 8.9 mg/dL (ref 8.9–10.3)
Chloride: 109 mmol/L (ref 98–111)
Creatinine, Ser: 0.88 mg/dL (ref 0.44–1.00)
GFR, Estimated: >60 mL/min (ref >60)
Glucose, Bld: 117 mg/dL — ABNORMAL HIGH (ref 70–99)
Potassium: 3.3 mmol/L — ABNORMAL LOW (ref 3.5–5.1)
Sodium: 142 mmol/L (ref 135–145)
Total Bilirubin: 0.8 mg/dL (ref 0.3–1.2)
Total Protein: 6.1 g/dL — ABNORMAL LOW (ref 6.5–8.1)

## 2022-06-20 LAB — TROPONIN I (HIGH SENSITIVITY)
Troponin I (High Sensitivity): 3 ng/L (ref <18)
Troponin I (High Sensitivity): 3 ng/L (ref <18)

## 2022-06-20 LAB — RAPID URINE DRUG SCREEN, HOSP PERFORMED
Amphetamines: NOT DETECTED
Barbiturates: NOT DETECTED
Benzodiazepines: NOT DETECTED
Cocaine: NOT DETECTED
Opiates: NOT DETECTED
Tetrahydrocannabinol: NOT DETECTED

## 2022-06-20 LAB — CBC
HCT: 40.3 % (ref 36.0–46.0)
Hemoglobin: 13.3 g/dL (ref 12.0–15.0)
MCH: 31.4 pg (ref 26.0–34.0)
MCHC: 33 g/dL (ref 30.0–36.0)
MCV: 95 fL (ref 80.0–100.0)
Platelets: 179 10*3/uL (ref 150–400)
RBC: 4.24 MIL/uL (ref 3.87–5.11)
RDW: 13.1 % (ref 11.5–15.5)
WBC: 7.6 10*3/uL (ref 4.0–10.5)
nRBC: 0 % (ref 0.0–0.2)

## 2022-06-20 LAB — CBG MONITORING, ED: Glucose-Capillary: 129 mg/dL — ABNORMAL HIGH (ref 70–99)

## 2022-06-20 LAB — ETHANOL: Alcohol, Ethyl (B): <10 mg/dL (ref <10)

## 2022-06-20 MED ORDER — POTASSIUM CHLORIDE CRYS ER 20 MEQ PO TBCR
40.0000 meq | EXTENDED_RELEASE_TABLET | Freq: Once | ORAL | Status: AC
  Administered 2022-06-20: 40 meq via ORAL
  Filled 2022-06-20: qty 2

## 2022-06-20 MED ORDER — ONDANSETRON 4 MG PO TBDP
4.0000 mg | ORAL_TABLET | Freq: Once | ORAL | Status: AC
  Administered 2022-06-20: 4 mg via ORAL
  Filled 2022-06-20: qty 1

## 2022-06-20 MED ORDER — LORAZEPAM 1 MG PO TABS
1.0000 mg | ORAL_TABLET | Freq: Once | ORAL | Status: AC
  Administered 2022-06-20: 1 mg via ORAL
  Filled 2022-06-20: qty 1

## 2022-06-20 MED ORDER — HYDROXYZINE HCL 25 MG PO TABS: 25.0000 mg | ORAL_TABLET | Freq: Every evening | ORAL | 0 refills | Status: DC | PRN

## 2022-06-20 NOTE — Discharge Instructions (Signed)
You were seen in the emergency department today for evaluation of your anxiety.  Your labs show that your potassium is low again, please make sure you are taking a potassium medications.  I given you a prescription for a small amount of hydroxyzine for you to take at night to help you sleep.  For additional refills of your medications, you do need to follow-up with your primary care doctor this week for evaluation of your anxiety and for refills of your medication.  I have included information on managing anxiety into this discharge paperwork.  Please read.  As previously discussed, you do have a lung nodule/mass seen in your lungs.  I have put [facility] ambulatory referral to pulmonology.  If they have not called you in the next few days, please make sure you call them to schedule an appointment.  If you have any concerns, new or worsening symptoms, please return to the nearest emergency department for reevaluation.  Contact a health care provider if: You have a hard time staying focused or finishing daily tasks. You spend many hours a day feeling worried about everyday life. You become exhausted by worry. You start to have headaches or frequently feel tense. You develop chronic nausea or diarrhea. Get help right away if: You have a racing heart and shortness of breath. You have thoughts of hurting yourself or others. If you ever feel like you may hurt yourself or others, or have thoughts about taking your own life, get help right away. Go to your nearest emergency department or: Call your local emergency services (911 in the [place] Call a suicide crisis helpline, such as the [place] at 2814885785 or 988 in the [place]. This is open 24 hours a day in the [place] Text the Crisis Text Line at (585)514-7682 (in the [place].).

## 2022-06-20 NOTE — ED Provider Notes (Signed)
[facility] DEPT Provider Note   CSN: [id number] Arrival date & time: 06/20/22  1545     History Chief Complaint  Patient presents with   Fatigue    Melanie Caldwell is a 66 y.o. female with history of anxiety, COPD, depression presents the emergency department for evaluation after her neighbor called in saying that she was confused while walking her dog.  The patient reports that she is not confused but she has been under an extreme amount of stress lately as she has had roommates living in her house that were unwelcome.  She reports that she went to the neighbor as she was feeling anxious and nauseous and that she might pass out.  She reports that she now feels safe at home as they have moved out however she has been under a lot of stress and anxiety because of this.  She reports that 2 days ago she had 1 episode of vomiting and felt some nausea.  Some nausea yesterday without any emesis.  Today again she reports that she had some nausea and she wanted to be evaluate it because "once I start vomiting I cannot stop".  Again, patient's last emesis was 2 days ago and was only a singular episode.  She denies any abdominal pain, chest pain, shortness of breath, or fever.  She is alert and oriented x4.  She is asking for refill of her diazepam.  Daily tobacco use.  HPI     Home Medications Prior to Admission medications   Medication Sig Start Date End Date Taking? Authorizing Provider  citalopram (CELEXA) 20 MG tablet Take 20 mg by mouth daily.  04/22/17   [provider]  DimenhyDRINATE (DRAMAMINE PO) Take 1 tablet by mouth 2 (two) times daily as needed (upset stomach).    [provider]  ibuprofen (ADVIL,MOTRIN) 600 MG tablet Take 1 tablet (600 mg total) by mouth every 6 (six) hours as needed. Patient not taking: Reported on 04/19/2018 05/03/17   [place] Lanius A, PA-C  Ibuprofen-Diphenhydramine Cit (ADVIL PM) 200-38 MG TABS Take 1 tablet by mouth  at bedtime as needed (sleep).    [provider]  omeprazole (PRILOSEC) 40 MG capsule Take 1 capsule (40 mg total) by mouth daily. Patient not taking: Reported on 04/19/2018 09/02/16   Ladene Artist, MD  ondansetron (ZOFRAN ODT) 4 MG disintegrating tablet Take 1 tablet (4 mg total) by mouth every 8 (eight) hours as needed for nausea or vomiting. 05/24/21   Charlann Lange, PA-C  ondansetron (ZOFRAN) 4 MG tablet Take 1 tablet (4 mg total) by mouth every 6 (six) hours. 06/21/21   Curatolo, Adam, DO  potassium chloride (K-DUR) 10 MEQ tablet Take 1 tablet (10 mEq total) by mouth daily. Patient not taking: Reported on 04/19/2018 05/03/17   [place] Lanius A, PA-C  potassium chloride SA (KLOR-CON) 20 MEQ tablet Take 1 tablet (20 mEq total) by mouth 2 (two) times daily. 05/24/21   Charlann Lange, PA-C  promethazine (PHENERGAN) 25 MG tablet take 1 tablet by mouth twice a day if needed for nausea and vomiting Patient not taking: Reported on 04/19/2018 01/15/17   Ladene Artist, MD  rosuvastatin (CRESTOR) 10 MG tablet Take 10 mg by mouth 2 (two) times a week.    [provider]  traZODone (DESYREL) 50 MG tablet Take 25-50 mg by mouth QHS 04/30/17   [provider]      Allergies    Adhesive [tape] and Ceftin [cefuroxime axetil]  Review of Systems   Review of Systems  Constitutional:  Negative for chills and fever.  Respiratory:  Negative for shortness of breath.   Cardiovascular:  Negative for chest pain.  Gastrointestinal:  Positive for nausea. Negative for abdominal pain, constipation, diarrhea and vomiting.  Genitourinary:  Negative for dysuria and hematuria.  Neurological:  Negative for weakness and headaches.  Psychiatric/Behavioral:  Negative for confusion. The patient is nervous/anxious.     Physical Exam Updated Vital Signs BP 113/70   Pulse 84   Temp 98.9 F (37.2 C) (Oral)   Resp (!) 21   Ht '4\' 10"'$  (1.473 m)   Wt 41.8 kg   SpO2 95%   BMI 19.26 kg/m   Physical Exam Vitals and nursing note reviewed.  Constitutional:      Appearance: She is not toxic-appearing.     Comments: Tearful, but nontoxic-appearing.  Thin.  Drinking sweet tea.  HENT:     Mouth/Throat:     Mouth: Mucous membranes are moist.  Eyes:     Extraocular Movements: Extraocular movements intact.     Pupils: Pupils are equal, round, and reactive to light.  Cardiovascular:     Rate and Rhythm: Normal rate.     Heart sounds: No murmur heard. Pulmonary:     Effort: Pulmonary effort is normal. No respiratory distress.     Breath sounds: Normal breath sounds.  Abdominal:     General: Abdomen is flat. Bowel sounds are normal.     Palpations: Abdomen is soft.     Tenderness: There is no abdominal tenderness. There is no guarding.  Musculoskeletal:     Cervical back: Normal range of motion. No rigidity.  Neurological:     General: No focal deficit present.     Mental Status: She is alert.     Cranial Nerves: No cranial nerve deficit.     Sensory: No sensory deficit.     Motor: No weakness.     Comments: Patient is alert and oriented x4 although does repeat herself sometimes.  No facial droop.  Patient answering questions appropriately with appropriate speech.   Psychiatric:        Mood and Affect: Mood is anxious. Affect is tearful.        Speech: Speech normal.        Thought Content: Thought content does not include homicidal or suicidal ideation. Thought content does not include homicidal or suicidal plan.     Comments: A&O x4     ED Results / Procedures / Treatments   Labs (all labs ordered are listed, but only abnormal results are displayed) Labs Reviewed  COMPREHENSIVE METABOLIC PANEL - Abnormal; Notable for the following components:      Result Value   Potassium 3.3 (*)    Glucose, Bld 117 (*)    Total Protein 6.1 (*)    Anion gap 4 (*)    All other components within normal limits  CBG MONITORING, ED - Abnormal; Notable for the following components:    Glucose-Capillary 129 (*)    All other components within normal limits  URINE CULTURE  CBC  URINALYSIS, ROUTINE W REFLEX MICROSCOPIC  RAPID URINE DRUG SCREEN, HOSP PERFORMED  ETHANOL  TROPONIN I (HIGH SENSITIVITY)    EKG None  Radiology CT Head Wo Contrast  Result Date: 06/20/2022 CLINICAL DATA:  66 year old female with altered mental status. EXAM: CT HEAD WITHOUT CONTRAST TECHNIQUE: Contiguous axial images were obtained from the base of the skull through the vertex without intravenous  contrast. RADIATION DOSE REDUCTION: This exam was performed according to the departmental dose-optimization program which includes automated exposure control, adjustment of the mA and/or kV according to patient size and/or use of iterative reconstruction technique. COMPARISON:  06/21/2021 CT and MR FINDINGS: Brain: No evidence of acute infarction, hemorrhage, hydrocephalus, extra-axial collection or mass lesion/mass effect. Atrophy and chronic small-vessel white matter ischemic changes again noted. Vascular: Carotid atherosclerotic calcifications are noted. Skull: Normal. Negative for fracture or focal lesion. Sinuses/Orbits: No acute finding. Other: None. IMPRESSION: 1. No evidence of acute intracranial abnormality. 2. Atrophy and chronic small-vessel white matter ischemic changes. Electronically Signed   By: Margarette Canada M.D.   On: 06/20/2022 19:19   DG Chest 2 View  Result Date: 06/20/2022 CLINICAL DATA:  Acute mental status change EXAM: CHEST - 2 VIEW COMPARISON:  Mar 17, 2016 FINDINGS: Hyperinflation of the lungs. Possible nodule in the upper chest based on the lateral view measuring up to 9 mm. Loss of height of a lower thoracic vertebral body. An blunting of left costophrenic angle. The cardiomediastinal silhouette is normal. No other acute abnormalities. IMPRESSION: 1. Possible nodule based on the lateral view measuring up to 9 mm. Recommend a CT scan of the chest. 2. Hyperinflation of the lungs consistent  with emphysema. 3. Blunting of left costophrenic angle may represent pleural thickening versus a tiny effusion. 4. No other acute abnormalities are identified. Electronically Signed   By: Dorise Bullion III M.D.   On: 06/20/2022 17:09    Procedures Procedures   Medications Ordered in ED Medications  LORazepam (ATIVAN) tablet 1 mg (has no administration in time range)    ED Course/ Medical Decision Making/ A&P                           Medical Decision Making Amount and/or Complexity of Data Reviewed Labs: ordered. Radiology: ordered.  Risk Prescription drug management.   66 year old female presents the emergency room for evaluation of anxiety and vomiting.  Differential diagnose includes was not limited to anxiety, depression, psych, dimension, delirium, electrolyte normality, ACS.  Vital signs are unremarkable.  Patient normotensive, satting well on room air without increased work of breathing.  Afebrile.  Normal heart rate.  Physical exam as noted above.  She has a benign neurological exam although occasionally will repeat herself.  She is A&O x3.  We will labs and imaging.  We will give Ativan for her anxiety.  I independently reviewed and interpreted the patient's labs.  Urinalysis unremarkable.  UDS negative.  Ethanol less than 10.  CBG at 129.  Troponin at 3.  CMP shows potassium at 3.3.  Mildly elevated glucose at 117.  Decreased protein at 6.1 otherwise no electrolyte or LFT abnormality.  CBC without leukocytosis or anemia.  Urine culture in process.  Potassium tablet ordered for her mild hypokalemia.  CT imaging of the head shows no evidence any acute intracranial abnormality.  There is atrophy and chronic small vessel white matter ischemic changes.  Chest x-ray shows Possible nodule based on the lateral view measuring up to 9 mm. Recommend a CT scan of the chest. 2. Hyperinflation of the lungs consistent with emphysema. 3. Blunting of left costophrenic angle may represent  pleural thickening versus a tiny effusion. 4. No other acute abnormalities are identified.  Discussed with attending. She can follow up outpatient for the CT scan.  EKG reviewed and interpreted my attending and reports no significant changes last tracing.  Spoke  with a neighbor, Tim, on the phone who reports that she was tearful and panicking so he called the ambulance. He reports he thought she was confused, but she was asking to see him.  Overall, her labs are reassuring, she has eaten a [place] sandwich without emesis.  She reports that she feels safe going back home.  She is requesting her benzodiazepines.  I did check PDMP and I do not see any recent diazepam or refills.  She is asking for other sedative medications.  I discussed with her that I can give her a prescription for hydroxyzine however she will need to follow-up with her primary care doctor for any additional prescriptions.  We also discussed about the lung mass seen and that she would need a referral to pulmonology.  We will place an ambulatory referral in for pulmonology.  Discussed that she will need to follow-up with her primary care doctor this week for evaluation.  We discussed return precautions and red flag symptoms.  Patient verbalized understanding and agrees to the plan.  Patient is stable being discharged home in good condition.  The patient reports that she remembers this episode and that she was just panicking and scared and needed to find someone.  I discussed this case with my attending physician who cosigned this note including patient's presenting symptoms, physical exam, and planned diagnostics and interventions. Attending physician stated agreement with plan or made changes to plan which were implemented.    Final Clinical Impression(s) / ED Diagnoses Final diagnoses:  Hypokalemia  Anxiety  Lung mass    Rx / DC Orders ED Discharge Orders          Ordered    hydrOXYzine (ATARAX) 25 MG tablet  At bedtime PRN         06/20/22 2111    Ambulatory referral to Pulmonology        06/20/22 2112              Sherrell Puller, Hershal Coria 06/20/22 2116    Gareth Morgan, MD 06/21/22 1038

## 2022-06-20 NOTE — ED Triage Notes (Addendum)
Pt BIB [facility] after neighbor called stating that pt seemed confused while outside walking her dog. Pt states that she has been under a lot of stress lately, has not taken her medications today or yesterday. Pt also c/o generalized weakness, dizziness earlier.

## 2022-06-20 NOTE — ED Notes (Signed)
Pt is eating a sandwich and drinking water, no nausea at this time.

## 2022-06-20 NOTE — ED Notes (Signed)
Pt ambulatory to restroom and back to bed. 

## 2022-06-21 LAB — URINE CULTURE: Culture: NO GROWTH

## 2022-06-24 ENCOUNTER — Emergency Department (HOSPITAL_COMMUNITY): Payer: Medicare Other

## 2022-06-24 ENCOUNTER — Emergency Department (HOSPITAL_COMMUNITY)
Admission: EM | Admit: 2022-06-24 | Discharge: 2022-06-24 | Disposition: A | Discharge status: Home / Self Care | Payer: Medicare Other | Attending: Emergency Medicine | Admitting: Emergency Medicine

## 2022-06-24 ENCOUNTER — Other Ambulatory Visit: Payer: Self-pay

## 2022-06-24 ENCOUNTER — Encounter (HOSPITAL_COMMUNITY): Payer: Self-pay

## 2022-06-24 DIAGNOSIS — J449 Chronic obstructive pulmonary disease, unspecified: Secondary | ICD-10-CM | POA: Diagnosis not present

## 2022-06-24 DIAGNOSIS — Z20822 Contact with and (suspected) exposure to covid-19: Secondary | ICD-10-CM | POA: Insufficient documentation

## 2022-06-24 DIAGNOSIS — R519 Headache, unspecified: Secondary | ICD-10-CM | POA: Diagnosis present

## 2022-06-24 DIAGNOSIS — F17210 Nicotine dependence, cigarettes, uncomplicated: Secondary | ICD-10-CM | POA: Insufficient documentation

## 2022-06-24 DIAGNOSIS — F32A Depression, unspecified: Secondary | ICD-10-CM | POA: Diagnosis not present

## 2022-06-24 DIAGNOSIS — Z79899 Other long term (current) drug therapy: Secondary | ICD-10-CM | POA: Diagnosis not present

## 2022-06-24 DIAGNOSIS — J45909 Unspecified asthma, uncomplicated: Secondary | ICD-10-CM | POA: Diagnosis not present

## 2022-06-24 LAB — COMPREHENSIVE METABOLIC PANEL
ALT: 13 U/L (ref 0–44)
AST: 14 U/L — ABNORMAL LOW (ref 15–41)
Albumin: 4.2 g/dL (ref 3.5–5.0)
Alkaline Phosphatase: 54 U/L (ref 38–126)
Anion gap: 7 (ref 5–15)
BUN: 16 mg/dL (ref 8–23)
CO2: 30 mmol/L (ref 22–32)
Calcium: 9.9 mg/dL (ref 8.9–10.3)
Chloride: 106 mmol/L (ref 98–111)
Creatinine, Ser: 0.68 mg/dL (ref 0.44–1.00)
GFR, Estimated: >60 mL/min (ref >60)
Glucose, Bld: 104 mg/dL — ABNORMAL HIGH (ref 70–99)
Potassium: 3.8 mmol/L (ref 3.5–5.1)
Sodium: 143 mmol/L (ref 135–145)
Total Bilirubin: 0.4 mg/dL (ref 0.3–1.2)
Total Protein: 7.2 g/dL (ref 6.5–8.1)

## 2022-06-24 LAB — CBC WITH DIFFERENTIAL/PLATELET
Abs Immature Granulocytes: 0.01 10*3/uL (ref 0.00–0.07)
Basophils Absolute: 0.1 10*3/uL (ref 0.0–0.1)
Basophils Relative: 1 %
Eosinophils Absolute: 0.1 10*3/uL (ref 0.0–0.5)
Eosinophils Relative: 1 %
HCT: 45.8 % (ref 36.0–46.0)
Hemoglobin: 15.2 g/dL — ABNORMAL HIGH (ref 12.0–15.0)
Immature Granulocytes: 0 %
Lymphocytes Relative: 26 %
Lymphs Abs: 2.1 10*3/uL (ref 0.7–4.0)
MCH: 32.1 pg (ref 26.0–34.0)
MCHC: 33.2 g/dL (ref 30.0–36.0)
MCV: 96.6 fL (ref 80.0–100.0)
Monocytes Absolute: 0.7 10*3/uL (ref 0.1–1.0)
Monocytes Relative: 9 %
Neutro Abs: 5.1 10*3/uL (ref 1.7–7.7)
Neutrophils Relative %: 63 %
Platelets: 194 10*3/uL (ref 150–400)
RBC: 4.74 MIL/uL (ref 3.87–5.11)
RDW: 13.1 % (ref 11.5–15.5)
WBC: 8 10*3/uL (ref 4.0–10.5)
nRBC: 0 % (ref 0.0–0.2)

## 2022-06-24 LAB — RAPID URINE DRUG SCREEN, HOSP PERFORMED
Amphetamines: NOT DETECTED
Barbiturates: NOT DETECTED
Benzodiazepines: NOT DETECTED
Cocaine: NOT DETECTED
Opiates: NOT DETECTED
Tetrahydrocannabinol: NOT DETECTED

## 2022-06-24 LAB — ACETAMINOPHEN LEVEL: Acetaminophen (Tylenol), Serum: <10 ug/mL — ABNORMAL LOW (ref 10–30)

## 2022-06-24 LAB — RESP PANEL BY RT-PCR (FLU A&B, COVID) ARPGX2
Influenza A by PCR: NEGATIVE
Influenza B by PCR: NEGATIVE
SARS Coronavirus 2 by RT PCR: NEGATIVE

## 2022-06-24 LAB — ETHANOL: Alcohol, Ethyl (B): <10 mg/dL (ref <10)

## 2022-06-24 LAB — SALICYLATE LEVEL: Salicylate Lvl: <7 mg/dL — ABNORMAL LOW (ref 7.0–30.0)

## 2022-06-24 MED ORDER — ONDANSETRON 8 MG PO TBDP
8.0000 mg | ORAL_TABLET | Freq: Once | ORAL | Status: AC
  Administered 2022-06-24: 8 mg via ORAL
  Filled 2022-06-24: qty 1

## 2022-06-24 MED ORDER — ACETAMINOPHEN 500 MG PO TABS
1000.0000 mg | ORAL_TABLET | Freq: Once | ORAL | Status: AC
  Administered 2022-06-24: 1000 mg via ORAL
  Filled 2022-06-24: qty 2

## 2022-06-24 NOTE — ED Triage Notes (Signed)
Reports ongoing depression and multiple stressors involving roommates in the household.   Denies si/hi.

## 2022-06-24 NOTE — ED Provider Triage Note (Signed)
Emergency Medicine Provider Triage Evaluation Note  MARGUETTA WINDISH , a 66 y.o. female  was evaluated in triage.  Pt complains of worsening depression.  Patient states she ran out of her depression medication.  She has previously been on hydroxyzine and diazepam as needed.  No SI, HI, and auditory/visual hallucinations.  Review of Systems  Positive: depression Negative: SI  Physical Exam  BP (!) 155/98 (BP Location: Left Arm)   Pulse 92   Temp 98.5 F (36.9 C) (Oral)   Resp 16   Ht '4\' 10"'$  (1.473 m)   Wt 36.3 kg   SpO2 98%   BMI 16.72 kg/m  Gen:   Awake, no distress   Resp:  Normal effort  MSK:   Moves extremities without difficulty  Other:    Medical Decision Making  Medically screening exam initiated at 7:21 PM.  Appropriate orders placed.  JAIDON SPONSEL was informed that the remainder of the evaluation will be completed by another provider, this initial triage assessment does not replace that evaluation, and the importance of remaining in the ED until their evaluation is complete.  Medical clearance labs Most likely can be discharged with outpatient follow-up if patient remains negative for SI   Karie Kirks 06/24/22 1922

## 2022-06-24 NOTE — ED Notes (Signed)
Save red tube in main lab 

## 2022-06-24 NOTE — ED Provider Notes (Signed)
[facility] DEPT Provider Note  CSN: [id number] Arrival date & time: 06/24/22 1856  Chief Complaint(s) Depression  HPI Melanie Caldwell is a 66 y.o. female with history of COPD, asthma presenting to the emergency department with headache.  Patient reports headache over the past few days.  She reports the symptoms are moderate.  She reports mild nausea.  No vision changes.  No head trauma.  No vomiting.  She denies any numbness, tingling, weakness.  Headache is intermittent.  It was not maximal in onset.  She also reports significant stress recently.  She reports she is depressed, denies suicidal or homicidal ideation, denies hallucinations.  Has previously sought care with a psychiatrist but currently not seeing a psychiatrist.   Past Medical History Past Medical History:  Diagnosis Date   ADD (attention deficit disorder)    Anxiety disorder    Asthma    Bulging lumbar disc    COPD (chronic obstructive pulmonary disease) ([place])    DDD (degenerative disc disease), lumbar    Depression    Fibroid uterus    Gastric ulcer    GERD (gastroesophageal reflux disease)    History of nephrolithiasis    History of UTI    Pyloric stenosis    Recreational drug use    History   Tobacco abuse    Patient Active Problem List   Diagnosis Date Noted   Adjustment disorder with depressed mood 04/19/2018   GERD 10/16/2010   CHEST PAIN UNSPECIFIED 10/16/2010   NAUSEA AND VOMITING 10/16/2010   ABDOMINAL PAIN, EPIGASTRIC 10/16/2010   DIARRHEA 02/12/2009   ASTHMA 10/01/2008   LEG PAIN, BILATERAL 10/01/2008   TOBACCO ABUSE 08/16/2008   SHORTNESS OF BREATH 08/16/2008   CHEST PAIN, ATYPICAL 08/16/2008   RHINOSINUSITIS, ACUTE 07/25/2008   FIBROIDS, UTERUS 07/12/2008   CONSTIPATION 07/12/2008   Abdominal pain, other specified site 07/06/2008   Home Medication(s) Prior to Admission medications   Medication Sig Start Date End Date Taking? Authorizing Provider  ADVIL 200  MG tablet Take 200-600 mg by mouth every 6 (six) hours as needed for mild pain or headache.    [provider]  DimenhyDRINATE (DRAMAMINE PO) Take 1 tablet by mouth 2 (two) times daily as needed (upset stomach).    [provider]  hydrOXYzine (ATARAX) 25 MG tablet Take 1 tablet (25 mg total) by mouth at bedtime as needed. 06/20/22   Sherrell Puller, PA-C  ibuprofen (ADVIL,MOTRIN) 600 MG tablet Take 1 tablet (600 mg total) by mouth every 6 (six) hours as needed. Patient not taking: Reported on 06/20/2022 05/03/17   Volanda Napoleon, PA-C  omeprazole (PRILOSEC) 40 MG capsule Take 1 capsule (40 mg total) by mouth daily. Patient not taking: Reported on 06/20/2022 09/02/16   Ladene Artist, MD  ondansetron (ZOFRAN ODT) 4 MG disintegrating tablet Take 1 tablet (4 mg total) by mouth every 8 (eight) hours as needed for nausea or vomiting. Patient not taking: Reported on 06/20/2022 05/24/21   Charlann Lange, PA-C  ondansetron (ZOFRAN) 4 MG tablet Take 1 tablet (4 mg total) by mouth every 6 (six) hours. Patient not taking: Reported on 06/20/2022 06/21/21   Lennice Sites, DO  potassium chloride (K-DUR) 10 MEQ tablet Take 1 tablet (10 mEq total) by mouth daily. Patient not taking: Reported on 06/20/2022 05/03/17   Providence Lanius A, PA-C  potassium chloride SA (KLOR-CON) 20 MEQ tablet Take 1 tablet (20 mEq total) by mouth 2 (two) times daily. Patient not taking: Reported on 06/20/2022  05/24/21   Charlann Lange, PA-C  promethazine (PHENERGAN) 25 MG tablet take 1 tablet by mouth twice a day if needed for nausea and vomiting Patient not taking: Reported on 06/20/2022 01/15/17   Ladene Artist, MD  rosuvastatin (CRESTOR) 10 MG tablet Take 10 mg by mouth 2 (two) times a week.    [provider]  traZODone (DESYREL) 50 MG tablet Take 25 mg by mouth at bedtime. 04/30/17   [provider]                                                                                                                                     Past Surgical History Past Surgical History:  Procedure Laterality Date   ESOPHAGEAL DILATION     EXPLORATION MIDDLE EAR     with revision of left stapedectomy and replacement of prosthesis   FETAL SURGERY FOR CONGENITAL HERNIA     HERNIA REPAIR     LITHOTRIPSY  1985   TONSILLECTOMY     Family History Family History  Problem Relation Age of Onset   Coronary artery disease Mother    Hypertension Mother    Diabetes Father    Colon cancer Maternal Grandfather     Social History Social History   Tobacco Use   Smoking status: Every Day    Packs/day: 0.50    Types: Cigarettes   Smokeless tobacco: Never   Tobacco comments:    Patient given counseling sheet to quit smoking; now smoking :hookah with 1 cigarette daily....  Substance Use Topics   Alcohol use: Yes    Comment: hx of heavy Etoh use in the past; now socially   Drug use: No   Allergies Sulfamethoxazole-trimethoprim, Adhesive [tape], Ceftin [cefuroxime axetil], and Codeine  Review of Systems Review of Systems  All other systems reviewed and are negative.   Physical Exam Vital Signs  I have reviewed the triage vital signs BP (!) 150/86 (BP Location: Left Arm)   Pulse 84   Temp 97.7 F (36.5 C) (Oral)   Resp 18   Ht '4\' 10"'$  (1.473 m)   Wt 36.3 kg   SpO2 95%   BMI 16.72 kg/m  Physical Exam Vitals and nursing note reviewed.  Constitutional:      General: She is not in acute distress.    Appearance: She is well-developed.  HENT:     Head: Normocephalic and atraumatic.     Mouth/Throat:     Mouth: Mucous membranes are moist.  Eyes:     Pupils: Pupils are equal, round, and reactive to light.  Cardiovascular:     Rate and Rhythm: Normal rate and regular rhythm.     Heart sounds: No murmur heard. Pulmonary:     Effort: Pulmonary effort is normal. No respiratory distress.     Breath sounds: Normal breath sounds.  Abdominal:     General: Abdomen is flat.     Palpations:  Abdomen is  soft.     Tenderness: There is no abdominal tenderness.  Musculoskeletal:        General: No tenderness.     Right lower leg: No edema.     Left lower leg: No edema.  Skin:    General: Skin is warm and dry.  Neurological:     General: No focal deficit present.     Mental Status: She is alert. Mental status is at baseline.     Comments: Cranial nerves II through XII intact, strength 5 out of 5 in the bilateral upper and lower extremities, no sensory deficit to light touch, no dysmetria on finger-nose-finger testing, ambulatory with steady gait.   Psychiatric:        Mood and Affect: Mood normal.        Behavior: Behavior normal.     ED Results and Treatments Labs (all labs ordered are listed, but only abnormal results are displayed) Labs Reviewed  COMPREHENSIVE METABOLIC PANEL - Abnormal; Notable for the following components:      Result Value   Glucose, Bld 104 (*)    AST 14 (*)    All other components within normal limits  CBC WITH DIFFERENTIAL/PLATELET - Abnormal; Notable for the following components:   Hemoglobin 15.2 (*)    All other components within normal limits  SALICYLATE LEVEL - Abnormal; Notable for the following components:   Salicylate Lvl <2.7 (*)    All other components within normal limits  ACETAMINOPHEN LEVEL - Abnormal; Notable for the following components:   Acetaminophen (Tylenol), Serum <10 (*)    All other components within normal limits  RESP PANEL BY RT-PCR (FLU A&B, COVID) ARPGX2  ETHANOL  RAPID URINE DRUG SCREEN, HOSP PERFORMED                                                                                                                          Radiology CT Head Wo Contrast  Result Date: 06/24/2022 CLINICAL DATA:  New onset headaches, initial encounter EXAM: CT HEAD WITHOUT CONTRAST TECHNIQUE: Contiguous axial images were obtained from the base of the skull through the vertex without intravenous contrast. RADIATION DOSE REDUCTION: This exam was  performed according to the departmental dose-optimization program which includes automated exposure control, adjustment of the mA and/or kV according to patient size and/or use of iterative reconstruction technique. COMPARISON:  06/20/2022 FINDINGS: Brain: No evidence of acute infarction, hemorrhage, hydrocephalus, extra-axial collection or mass lesion/mass effect. Mild chronic white matter ischemic changes noted. Vascular: No hyperdense vessel or unexpected calcification. Skull: Normal. Negative for fracture or focal lesion. Sinuses/Orbits: No acute finding. Other: None. IMPRESSION: No acute abnormality noted. Electronically Signed   By: Inez Catalina M.D.   On: 06/24/2022 21:38    Pertinent labs & imaging results that were available during my care of the patient were reviewed by me and considered in my medical decision making (see MDM for details).  Medications Ordered in ED Medications  ondansetron (ZOFRAN-ODT)  disintegrating tablet 8 mg (8 mg Oral Given 06/24/22 2125)  acetaminophen (TYLENOL) tablet 1,000 mg (1,000 mg Oral Given 06/24/22 2124)                                                                                                                                     Procedures Procedures  (including critical care time)  Medical Decision Making / ED Course   MDM:  66 year old female presenting to the emergency department with headache.  Patient well-appearing, neurologic exam reassuring.  Given age, obtained CT head to evaluate for intracranial bleeding, mass effect, tumor, without evidence of intracranial process.  Doubt other causes such as CO exposure,, sinus thrombosis.  Suspect headache in the setting of recent stress.  Patient also reports depression, denies any suicidal or homicidal ideation.  Discussed behavioral health urgent care with the patient.  Discussed self-care for headaches. Will discharge patient to home. All questions answered. Patient comfortable with plan of  discharge. Return precautions discussed with patient and specified on the after visit summary.       Additional history obtained: -External records from outside source obtained and reviewed including: Chart review including previous notes, labs, imaging, consultation notes   Lab Tests: -I ordered, reviewed, and interpreted labs.   The pertinent results include:   Labs Reviewed  COMPREHENSIVE METABOLIC PANEL - Abnormal; Notable for the following components:      Result Value   Glucose, Bld 104 (*)    AST 14 (*)    All other components within normal limits  CBC WITH DIFFERENTIAL/PLATELET - Abnormal; Notable for the following components:   Hemoglobin 15.2 (*)    All other components within normal limits  SALICYLATE LEVEL - Abnormal; Notable for the following components:   Salicylate Lvl <2.9 (*)    All other components within normal limits  ACETAMINOPHEN LEVEL - Abnormal; Notable for the following components:   Acetaminophen (Tylenol), Serum <10 (*)    All other components within normal limits  RESP PANEL BY RT-PCR (FLU A&B, COVID) ARPGX2  ETHANOL  RAPID URINE DRUG SCREEN, HOSP PERFORMED      EKG   EKG Interpretation  Date/Time:    Ventricular Rate:    PR Interval:    QRS Duration:   QT Interval:    QTC Calculation:   R Axis:     Text Interpretation:           Imaging Studies ordered: I ordered imaging studies including CT head On my interpretation imaging demonstrates no acute intracranial process I independently visualized and interpreted imaging. I agree with the radiologist interpretation   Medicines ordered and prescription drug management: Meds ordered this encounter  Medications   ondansetron (ZOFRAN-ODT) disintegrating tablet 8 mg   acetaminophen (TYLENOL) tablet 1,000 mg    -I have reviewed the patients home medicines and have made adjustments as needed  Social Determinants of Health:  Factors impacting patients care include:  former  smoker   Reevaluation: After the interventions noted above, I reevaluated the patient and found that they have improved  Co morbidities that complicate the patient evaluation  Past Medical History:  Diagnosis Date   ADD (attention deficit disorder)    Anxiety disorder    Asthma    Bulging lumbar disc    COPD (chronic obstructive pulmonary disease) ([facility])    DDD (degenerative disc disease), lumbar    Depression    Fibroid uterus    Gastric ulcer    GERD (gastroesophageal reflux disease)    History of nephrolithiasis    History of UTI    Pyloric stenosis    Recreational drug use    History   Tobacco abuse       Dispostion: Discharge    Final Clinical Impression(s) / ED Diagnoses Final diagnoses:  Nonintractable headache, unspecified chronicity pattern, unspecified headache type  Depression, unspecified depression type     This chart was dictated using voice recognition software.  Despite best efforts to proofread,  errors can occur which can change the documentation meaning.    Cristie Hem, MD 06/24/22 [phone]

## 2022-06-30 ENCOUNTER — Encounter (HOSPITAL_COMMUNITY): Payer: Self-pay

## 2022-06-30 ENCOUNTER — Emergency Department (HOSPITAL_COMMUNITY)
Admission: EM | Admit: 2022-06-30 | Discharge: 2022-06-30 | Disposition: A | Discharge status: Home / Self Care | Payer: Medicare Other | Attending: Emergency Medicine | Admitting: Emergency Medicine

## 2022-06-30 ENCOUNTER — Other Ambulatory Visit: Payer: Self-pay

## 2022-06-30 DIAGNOSIS — F32A Depression, unspecified: Secondary | ICD-10-CM | POA: Insufficient documentation

## 2022-06-30 DIAGNOSIS — Z79899 Other long term (current) drug therapy: Secondary | ICD-10-CM | POA: Insufficient documentation

## 2022-06-30 DIAGNOSIS — R1011 Right upper quadrant pain: Secondary | ICD-10-CM | POA: Insufficient documentation

## 2022-06-30 DIAGNOSIS — R112 Nausea with vomiting, unspecified: Secondary | ICD-10-CM | POA: Insufficient documentation

## 2022-06-30 DIAGNOSIS — R1012 Left upper quadrant pain: Secondary | ICD-10-CM | POA: Diagnosis not present

## 2022-06-30 LAB — BASIC METABOLIC PANEL
Anion gap: 8 (ref 5–15)
BUN: 6 mg/dL — ABNORMAL LOW (ref 8–23)
CO2: 27 mmol/L (ref 22–32)
Calcium: 9.5 mg/dL (ref 8.9–10.3)
Chloride: 106 mmol/L (ref 98–111)
Creatinine, Ser: 0.6 mg/dL (ref 0.44–1.00)
GFR, Estimated: >60 mL/min (ref >60)
Glucose, Bld: 100 mg/dL — ABNORMAL HIGH (ref 70–99)
Potassium: 3.6 mmol/L (ref 3.5–5.1)
Sodium: 141 mmol/L (ref 135–145)

## 2022-06-30 LAB — CBC
HCT: 48.3 % — ABNORMAL HIGH (ref 36.0–46.0)
Hemoglobin: 16 g/dL — ABNORMAL HIGH (ref 12.0–15.0)
MCH: 31.5 pg (ref 26.0–34.0)
MCHC: 33.1 g/dL (ref 30.0–36.0)
MCV: 95.1 fL (ref 80.0–100.0)
Platelets: 224 10*3/uL (ref 150–400)
RBC: 5.08 MIL/uL (ref 3.87–5.11)
RDW: 13.3 % (ref 11.5–15.5)
WBC: 8.1 10*3/uL (ref 4.0–10.5)
nRBC: 0 % (ref 0.0–0.2)

## 2022-06-30 MED ORDER — ONDANSETRON 8 MG PO TBDP
8.0000 mg | ORAL_TABLET | Freq: Once | ORAL | Status: AC
Start: 2022-06-30 — End: 2022-06-30
  Administered 2022-06-30: 8 mg via ORAL
  Filled 2022-06-30: qty 1

## 2022-06-30 MED ORDER — ONDANSETRON HCL 4 MG PO TABS: 4.0000 mg | ORAL_TABLET | Freq: Three times a day (TID) | ORAL | 0 refills | Status: DC | PRN

## 2022-06-30 NOTE — ED Triage Notes (Addendum)
Pt BIB EMS from home. [employer] called out for welfare check and pt is living without power at this time. Pt has severe depression since issues with family. Pt has not been eating much. A&O x4. Denies SI/HI and AH/VH.

## 2022-06-30 NOTE — ED Provider Triage Note (Signed)
Emergency Medicine Provider Triage Evaluation Note  Melanie Caldwell , a 66 y.o. female  was evaluated in triage.  Pt complains of feeling depressed and having a poor appetite.  EMS contacted by her brother-in-law due to concern of same.  Denies SI/HI, or AVH.  Last meal was yesterday where she had "some chicken".  Was here few days ago for the same.  Review of Systems  Positive:  Negative: See above  Physical Exam  BP 101/76   Pulse 94   Temp 98.5 F (36.9 C) (Oral)   Resp 16   SpO2 96%  Gen:   Awake, no distress   Resp:  Normal effort  MSK:   Moves extremities without difficulty  Other:  Appears underweight.  Tearful.  Medical Decision Making  Medically screening exam initiated at 12:25 PM.  Appropriate orders placed.  NILSA MACHT was informed that the remainder of the evaluation will be completed by another provider, this initial triage assessment does not replace that evaluation, and the importance of remaining in the ED until their evaluation is complete.     Prince Rome, PA-C 30/05/11 1226

## 2022-06-30 NOTE — ED Provider Notes (Signed)
[facility] DEPT Provider Note   CSN: [id number] Arrival date & time: 06/30/22  1148     History {Add pertinent medical, surgical, social history, OB history to HPI:1} Chief Complaint  Patient presents with   Depression    Melanie Caldwell is a 66 y.o. female.  HPI     66 year old female comes in with chief complaint of nausea and vomiting.      She indicated to me that she chronically has depression and that is not her primary complaint.  Although she has been out of her medications, and does not have means to follow-up with psychiatrist.  Patient does have a PCP. Home Medications Prior to Admission medications   Medication Sig Start Date End Date Taking? Authorizing Provider  ondansetron (ZOFRAN) 4 MG tablet Take 1 tablet (4 mg total) by mouth every 8 (eight) hours as needed for nausea or vomiting. 06/30/22  Yes Amariah Kierstead, MD  ADVIL 200 MG tablet Take 200-600 mg by mouth every 6 (six) hours as needed for mild pain or headache.    [provider]  DimenhyDRINATE (DRAMAMINE PO) Take 1 tablet by mouth 2 (two) times daily as needed (upset stomach).    [provider]  hydrOXYzine (ATARAX) 25 MG tablet Take 1 tablet (25 mg total) by mouth at bedtime as needed. 06/20/22   Sherrell Puller, PA-C  ibuprofen (ADVIL,MOTRIN) 600 MG tablet Take 1 tablet (600 mg total) by mouth every 6 (six) hours as needed. Patient not taking: Reported on 06/20/2022 05/03/17   Volanda Napoleon, PA-C  omeprazole (PRILOSEC) 40 MG capsule Take 1 capsule (40 mg total) by mouth daily. Patient not taking: Reported on 06/20/2022 09/02/16   Ladene Artist, MD  ondansetron (ZOFRAN ODT) 4 MG disintegrating tablet Take 1 tablet (4 mg total) by mouth every 8 (eight) hours as needed for nausea or vomiting. Patient not taking: Reported on 06/20/2022 05/24/21   Charlann Lange, PA-C  potassium chloride (K-DUR) 10 MEQ tablet Take 1 tablet (10 mEq total) by mouth daily. Patient  not taking: Reported on 06/20/2022 05/03/17   Providence Lanius A, PA-C  potassium chloride SA (KLOR-CON) 20 MEQ tablet Take 1 tablet (20 mEq total) by mouth 2 (two) times daily. Patient not taking: Reported on 06/20/2022 05/24/21   Charlann Lange, PA-C  promethazine (PHENERGAN) 25 MG tablet take 1 tablet by mouth twice a day if needed for nausea and vomiting Patient not taking: Reported on 06/20/2022 01/15/17   Ladene Artist, MD  rosuvastatin (CRESTOR) 10 MG tablet Take 10 mg by mouth 2 (two) times a week.    [provider]  traZODone (DESYREL) 50 MG tablet Take 25 mg by mouth at bedtime. 04/30/17   [provider]      Allergies    Sulfamethoxazole-trimethoprim, Adhesive [tape], Ceftin [cefuroxime axetil], and Codeine    Review of Systems   Review of Systems  Physical Exam Updated Vital Signs BP 101/76   Pulse 94   Temp 98.5 F (36.9 C) (Oral)   Resp 16   SpO2 96%  Physical Exam  ED Results / Procedures / Treatments   Labs (all labs ordered are listed, but only abnormal results are displayed) Labs Reviewed  BASIC METABOLIC PANEL - Abnormal; Notable for the following components:      Result Value   Glucose, Bld 100 (*)    BUN 6 (*)    All other components within normal limits  CBC - Abnormal; Notable for the following  components:   Hemoglobin 16.0 (*)    HCT 48.3 (*)    All other components within normal limits    EKG None  Radiology No results found.  Procedures Procedures  {Document cardiac monitor, telemetry assessment procedure when appropriate:1}  Medications Ordered in ED Medications  ondansetron (ZOFRAN-ODT) disintegrating tablet 8 mg (8 mg Oral Given 06/30/22 1655)    ED Course/ Medical Decision Making/ A&P                           Medical Decision Making Risk Prescription drug management.   ***  {Document critical care time when appropriate:1} {Document review of labs and clinical decision tools ie heart score, Chads2Vasc2 etc:1}   {Document your independent review of radiology images, and any outside records:1} {Document your discussion with family members, caretakers, and with consultants:1} {Document social determinants of health affecting pt's care:1} {Document your decision making why or why not admission, treatments were needed:1} Final Clinical Impression(s) / ED Diagnoses Final diagnoses:  Nausea and vomiting, unspecified vomiting type    Rx / DC Orders ED Discharge Orders          Ordered    ondansetron (ZOFRAN) 4 MG tablet  Every 8 hours PRN        06/30/22 1709

## 2023-11-08 ENCOUNTER — Observation Stay (HOSPITAL_COMMUNITY)
Admission: EM | Admit: 2023-11-08 | Discharge: 2023-11-09 | Disposition: A | Discharge status: Home / Self Care | Payer: Medicare Other | Attending: Internal Medicine | Admitting: Internal Medicine

## 2023-11-08 ENCOUNTER — Emergency Department (HOSPITAL_COMMUNITY): Payer: Medicare Other

## 2023-11-08 ENCOUNTER — Encounter (HOSPITAL_COMMUNITY): Payer: Self-pay

## 2023-11-08 ENCOUNTER — Other Ambulatory Visit: Payer: Self-pay

## 2023-11-08 DIAGNOSIS — I4589 Other specified conduction disorders: Secondary | ICD-10-CM | POA: Insufficient documentation

## 2023-11-08 DIAGNOSIS — I50812 Chronic right heart failure: Secondary | ICD-10-CM

## 2023-11-08 DIAGNOSIS — F4321 Adjustment disorder with depressed mood: Secondary | ICD-10-CM

## 2023-11-08 DIAGNOSIS — I509 Heart failure, unspecified: Secondary | ICD-10-CM | POA: Insufficient documentation

## 2023-11-08 DIAGNOSIS — D72829 Elevated white blood cell count, unspecified: Secondary | ICD-10-CM

## 2023-11-08 DIAGNOSIS — R55 Syncope and collapse: Principal | ICD-10-CM

## 2023-11-08 DIAGNOSIS — R6510 Systemic inflammatory response syndrome (SIRS) of non-infectious origin without acute organ dysfunction: Secondary | ICD-10-CM | POA: Diagnosis not present

## 2023-11-08 DIAGNOSIS — J4489 Other specified chronic obstructive pulmonary disease: Secondary | ICD-10-CM | POA: Diagnosis not present

## 2023-11-08 DIAGNOSIS — R911 Solitary pulmonary nodule: Principal | ICD-10-CM

## 2023-11-08 DIAGNOSIS — F32A Depression, unspecified: Secondary | ICD-10-CM

## 2023-11-08 DIAGNOSIS — F17210 Nicotine dependence, cigarettes, uncomplicated: Secondary | ICD-10-CM | POA: Insufficient documentation

## 2023-11-08 DIAGNOSIS — F17200 Nicotine dependence, unspecified, uncomplicated: Secondary | ICD-10-CM

## 2023-11-08 DIAGNOSIS — R0789 Other chest pain: Secondary | ICD-10-CM

## 2023-11-08 DIAGNOSIS — R9431 Abnormal electrocardiogram [ECG] [EKG]: Secondary | ICD-10-CM

## 2023-11-08 DIAGNOSIS — I4581 Long QT syndrome: Secondary | ICD-10-CM | POA: Insufficient documentation

## 2023-11-08 DIAGNOSIS — Z8709 Personal history of other diseases of the respiratory system: Secondary | ICD-10-CM

## 2023-11-08 DIAGNOSIS — R0602 Shortness of breath: Secondary | ICD-10-CM

## 2023-11-08 DIAGNOSIS — R1013 Epigastric pain: Secondary | ICD-10-CM

## 2023-11-08 HISTORY — DX: Hyperlipidemia, unspecified: E78.5

## 2023-11-08 LAB — CBC
HCT: 44.8 % (ref 36.0–46.0)
Hemoglobin: 14.5 g/dL (ref 12.0–15.0)
MCH: 30.3 pg (ref 26.0–34.0)
MCHC: 32.4 g/dL (ref 30.0–36.0)
MCV: 93.7 fL (ref 80.0–100.0)
Platelets: 189 10*3/uL (ref 150–400)
RBC: 4.78 MIL/uL (ref 3.87–5.11)
RDW: 12.5 % (ref 11.5–15.5)
WBC: 15.9 10*3/uL — ABNORMAL HIGH (ref 4.0–10.5)
nRBC: 0 % (ref 0.0–0.2)

## 2023-11-08 LAB — DIFFERENTIAL
Abs Immature Granulocytes: 0.07 10*3/uL (ref 0.00–0.07)
Basophils Absolute: 0.1 10*3/uL (ref 0.0–0.1)
Basophils Relative: 1 %
Eosinophils Absolute: 0.1 10*3/uL (ref 0.0–0.5)
Eosinophils Relative: 0 %
Immature Granulocytes: 0 %
Lymphocytes Relative: 9 %
Lymphs Abs: 1.4 10*3/uL (ref 0.7–4.0)
Monocytes Absolute: 1 10*3/uL (ref 0.1–1.0)
Monocytes Relative: 6 %
Neutro Abs: 13.2 10*3/uL — ABNORMAL HIGH (ref 1.7–7.7)
Neutrophils Relative %: 84 %

## 2023-11-08 LAB — URINALYSIS, ROUTINE W REFLEX MICROSCOPIC
Bilirubin Urine: NEGATIVE
Glucose, UA: NEGATIVE mg/dL
Hgb urine dipstick: NEGATIVE
Ketones, ur: 5 mg/dL — AB
Leukocytes,Ua: NEGATIVE
Nitrite: NEGATIVE
Protein, ur: NEGATIVE mg/dL
Specific Gravity, Urine: 1.01 (ref 1.005–1.030)
pH: 6 (ref 5.0–8.0)

## 2023-11-08 LAB — BASIC METABOLIC PANEL
Anion gap: 10 (ref 5–15)
BUN: 10 mg/dL (ref 8–23)
CO2: 25 mmol/L (ref 22–32)
Calcium: 8.7 mg/dL — ABNORMAL LOW (ref 8.9–10.3)
Chloride: 100 mmol/L (ref 98–111)
Creatinine, Ser: 0.67 mg/dL (ref 0.44–1.00)
GFR, Estimated: >60 mL/min (ref >60)
Glucose, Bld: 156 mg/dL — ABNORMAL HIGH (ref 70–99)
Potassium: 3.7 mmol/L (ref 3.5–5.1)
Sodium: 135 mmol/L (ref 135–145)

## 2023-11-08 LAB — CBG MONITORING, ED: Glucose-Capillary: 135 mg/dL — ABNORMAL HIGH (ref 70–99)

## 2023-11-08 LAB — MAGNESIUM: Magnesium: 2.1 mg/dL (ref 1.7–2.4)

## 2023-11-08 LAB — TROPONIN I (HIGH SENSITIVITY): Troponin I (High Sensitivity): 3 ng/L (ref <18)

## 2023-11-08 MED ORDER — ACETAMINOPHEN 650 MG RE SUPP: 650.0000 mg | Freq: Four times a day (QID) | RECTAL | Status: DC | PRN

## 2023-11-08 MED ORDER — SODIUM CHLORIDE 0.9 % IV BOLUS
500.0000 mL | Freq: Once | INTRAVENOUS | Status: AC
  Administered 2023-11-08: 500 mL via INTRAVENOUS

## 2023-11-08 MED ORDER — ONDANSETRON HCL 4 MG/2ML IJ SOLN
4.0000 mg | Freq: Four times a day (QID) | INTRAMUSCULAR | Status: DC | PRN
  Administered 2023-11-08 – 2023-11-09 (×2): 4 mg via INTRAVENOUS
  Filled 2023-11-08 (×2): qty 2

## 2023-11-08 MED ORDER — IOHEXOL 350 MG/ML SOLN
75.0000 mL | Freq: Once | INTRAVENOUS | Status: AC | PRN
  Administered 2023-11-08: 75 mL via INTRAVENOUS

## 2023-11-08 MED ORDER — ONDANSETRON 4 MG PO TBDP
4.0000 mg | ORAL_TABLET | Freq: Once | ORAL | Status: AC
  Administered 2023-11-08: 4 mg via ORAL
  Filled 2023-11-08: qty 1

## 2023-11-08 MED ORDER — POTASSIUM CHLORIDE CRYS ER 20 MEQ PO TBCR
40.0000 meq | EXTENDED_RELEASE_TABLET | Freq: Once | ORAL | Status: AC
Start: 2023-11-08 — End: 2023-11-08
  Administered 2023-11-08: 40 meq via ORAL
  Filled 2023-11-08: qty 2

## 2023-11-08 MED ORDER — ACETAMINOPHEN 325 MG PO TABS
650.0000 mg | ORAL_TABLET | Freq: Four times a day (QID) | ORAL | Status: DC | PRN
  Administered 2023-11-08 – 2023-11-09 (×2): 650 mg via ORAL
  Filled 2023-11-08 (×2): qty 2

## 2023-11-08 MED ORDER — ALBUTEROL SULFATE (2.5 MG/3ML) 0.083% IN NEBU
2.5000 mg | INHALATION_SOLUTION | RESPIRATORY_TRACT | Status: DC | PRN
  Administered 2023-11-09: 2.5 mg via RESPIRATORY_TRACT
  Filled 2023-11-08: qty 3

## 2023-11-08 MED ORDER — MELATONIN 3 MG PO TABS: 3.0000 mg | ORAL_TABLET | Freq: Every evening | ORAL | Status: DC | PRN

## 2023-11-08 MED ORDER — SODIUM CHLORIDE 0.9 % IV SOLN
INTRAVENOUS | Status: AC
Start: 2023-11-08 — End: 2023-11-09

## 2023-11-08 NOTE — ED Provider Triage Note (Signed)
 Emergency Medicine Provider Triage Evaluation Note  Melanie Caldwell , a 68 y.o. female  was evaluated in triage.  Pt complains of syncopal episode. Facility staff said she had LOC with unknown down time and possible absence seizure. Nurse at facility. Denis Hx of seizures, syncope. Denies incontinence, tongue trauma.  Ate breakfast this morning. Currently feeling better after 500mL NS given. Initial BP was 80s/50s, repeat was 134/72 after fluids. Did not fall, did not hit head, was in the chair through the entire episode.   Review of Systems  Positive: Generalized weakness.  Negative: Fevers, headaches, chest pain, dyspnea, vomiting, diarrhea, 1-sided weakness, sensation loss  Physical Exam  BP 134/76   Pulse 83   Temp 98.2 F (36.8 C) (Oral)   Resp 15   Ht 4' 10 (1.473 m)   Wt 47.6 kg   SpO2 94%   BMI 21.95 kg/m  Gen:   Awake, no distress   Resp:  Normal effort  MSK:   Moves extremities without difficulty  Other:    Medical Decision Making  Medically screening exam initiated at 2:15 PM.  Appropriate orders placed.  Melanie Caldwell was informed that the remainder of the evaluation will be completed by another provider, this initial triage assessment does not replace that evaluation, and the importance of remaining in the ED until their evaluation is complete.     Beola Terrall RAMAN, [place] 11/08/23 1422

## 2023-11-08 NOTE — Discharge Instructions (Signed)
 Please follow up with your primary doctor for follow up CT scan of lung nodule seen on your chest x-ray

## 2023-11-08 NOTE — ED Notes (Signed)
 Pt ambulated to restroom.

## 2023-11-08 NOTE — H&P (Signed)
 History and Physical      BAHJA BENCE FMW:[id number] DOB: 02/23/1956 DOA: 11/08/2023; DOS: 11/08/2023  PCP: Nichole Senior, MD  Patient coming from: ALF   I have personally briefly reviewed patient's old medical records in [facility]  Chief Complaint: Episode of loss of consciousness  HPI: Melanie Caldwell is a 68 y.o. female with medical history significant for COPD, depression, who is admitted to [facility] on 11/08/2023 with episode of syncope after presenting from ALF to [facility] ED complaining of a single episode of loss of consciousness.   The patient reports a single episode of loss of consciousness that occurred while eating a meal at her assisted living facility earlier today.  She notes that she was at her normal state of health, seated, having a meal, before approximately losing consciousness, which was witnessed by those whom she was dining with at the time, conveyed that the patient remained unconscious for only a few seconds.  She did not fall out of the chair or hit her head as a component of this episode.  No report of overt tonic-clonic activity associated with this event, which was not associate any tongue biting, nor any loss of bowel/bladder function.  Patient denies any acute focal weakness nor any acute focal sensory deficits.  Upon awakening, there was no period of prolonged confusion.  The patient conveys that immediately prior to the episode of loss of consciousness, that she experienced the sensation that something is not right before waking up from her episode of loss of consciousness.  She denies any preceding palpitations, diaphoresis, nausea, vomiting, dizziness, or subjective sensation of impending loss of consciousness.  No recent chest pain, shortness of breath.  Denies any recent abdominal pain, melena, hematochezia.  No ensuing episodes of loss of consciousness.  Per chart review, no prior echocardiogram on file.  EMS was called to the assisted  living facility, and noted initial blood pressure to be in the systolic 80s.  She is subsequent brought to [facility] emergency department for further evaluation management thereof.   ED Course:  Vital signs in the ED were notable for the following: Afebrile; heart rates in the 80s to 90s; systolic blood pressures in the 90s to 130s; respiratory rate 16-22, oxygen saturation 91 to 94%.  For patient comfort, she was subsequent started on 1 L nasal cannula with ensuing oxygen saturations noted to be in the high 90s.  Per my discussion with the EDP, patient demonstrated positive orthostatic vital signs in the ED prior to initiation of IV fluids.  Labs were notable for the following: BMP notable for sodium 135, potassium 3.7, bicarbonate 25, creatinine 0.67, glucose 126 and high-sensitivity troponin I x 1 value noted to be 3.  CBC notable for white cell count 15,900 with 84% neutrophils compared to most recent prior with cell count of 8100 in September 2023, hemoglobin 14.5.  Urinalysis showed no white blood cells and was leukocyte esterase/nitrate negative.  Per my interpretation, EKG in ED demonstrated the following: Interpretation of EKG was limited by the presence of motion artifact.  However, within its confines, appears to show sinus rhythm with prolonged QTc of 565, as well as nonspecific T wave inversion in V3, and no evidence of ST changes, including evidence of ST elevation.  Imaging in the ED, per corresponding formal radiology read, was notable for the following: Noncontrast CT head showed no evidence of acute intracranial process, including no evidence of intracranial strain evidence of acute infarct.  CTA  chest with PE protocol showed no evidence of acute pulmonary embolism or any evidence of acute cardiopulmonary process, including no evidence of infiltrate, edema, effusion, or pneumothorax, while showing evidence of chronic moderate least severe emphysematous changes.  While in the ED, the  following were administered: Zofran  4 mg ODT x 1, normal saline x 500 cc bolus x 2 occurrences.  Subsequently, the patient was admitted for further evaluation management of single syncopal episode, with presentation also notable for the presence of SIRS criteria as well as prolonged QTc.    Review of Systems: As per HPI otherwise 10 point review of systems negative.   Past Medical History:  Diagnosis Date   ADD (attention deficit disorder)    Anxiety disorder    Asthma    Bulging lumbar disc    COPD (chronic obstructive pulmonary disease) (HCC)    DDD (degenerative disc disease), lumbar    Depression    Fibroid uterus    Gastric ulcer    GERD (gastroesophageal reflux disease)    History of nephrolithiasis    History of UTI    HLD (hyperlipidemia)    Pyloric stenosis    Recreational drug use    History   Tobacco abuse     Past Surgical History:  Procedure Laterality Date   ESOPHAGEAL DILATION     EXPLORATION MIDDLE EAR     with revision of left stapedectomy and replacement of prosthesis   FETAL SURGERY FOR CONGENITAL HERNIA     HERNIA REPAIR     LITHOTRIPSY  1985   TONSILLECTOMY      Social History:  reports that she has been smoking cigarettes. She has never used smokeless tobacco. She reports current alcohol use. She reports that she does not use drugs.   Allergies  Allergen Reactions   Sulfamethoxazole -Trimethoprim  Rash   Adhesive [Tape] Other (See Comments)    Blisters with certain tapes   Ceftin [Cefuroxime Axetil] Hives   Codeine Nausea And Vomiting    Family History  Problem Relation Age of Onset   Coronary artery disease Mother    Hypertension Mother    Diabetes Father    Colon cancer Maternal Grandfather     Family history reviewed and not pertinent    Prior to Admission medications   Medication Sig Start Date End Date Taking? Authorizing Provider  ADVIL  200 MG tablet Take 200-600 mg by mouth every 6 (six) hours as needed for mild pain or  headache.   Yes [provider]  citalopram  (CELEXA ) 20 MG tablet Take 20 mg by mouth daily.   Yes [provider]  hydrOXYzine  (ATARAX ) 25 MG tablet Take 1 tablet (25 mg total) by mouth at bedtime as needed. Patient taking differently: Take 25 mg by mouth at bedtime as needed for anxiety. 06/20/22  Yes Bernis Ernst, PA-C  traZODone  (DESYREL ) 50 MG tablet Take 25 mg by mouth at bedtime. 04/30/17  Yes [provider]     Objective    Physical Exam: Vitals:   11/08/23 1725 11/08/23 2015 11/08/23 2055 11/08/23 2100  BP:  131/66  97/60  Pulse: 84 91 83 81  Resp:  20  20  Temp:   97.9 F (36.6 C)   TempSrc:   Oral   SpO2: 93% 91% 99% 99%  Weight:      Height:        General: appears to be stated age; alert, oriented Skin: warm, dry, no rash Head:  AT/[place] Mouth:  Oral mucosa membranes appear  dry, normal dentition Neck: supple; trachea midline Heart:  RRR; did not appreciate any M/R/G Lungs: CTAB, did not appreciate any wheezes, rales, or rhonchi Abdomen: + BS; soft, ND, NT Vascular: 2+ pedal pulses b/l; 2+ radial pulses b/l Extremities: no peripheral edema, no muscle wasting Neuro: strength and sensation intact in upper and lower extremities b/l    Labs on Admission: I have personally reviewed following labs and imaging studies  CBC: Recent Labs  Lab 11/08/23 1417  WBC 15.9*  NEUTROABS 13.2*  HGB 14.5  HCT 44.8  MCV 93.7  PLT 189   Basic Metabolic Panel: Recent Labs  Lab 11/08/23 1417  NA 135  K 3.7  CL 100  CO2 25  GLUCOSE 156*  BUN 10  CREATININE 0.67  CALCIUM  8.7*   GFR: Estimated Creatinine Clearance: 44.1 mL/min (by C-G formula based on SCr of 0.67 mg/dL). Liver Function Tests: No results for input(s): AST, ALT, ALKPHOS, BILITOT, PROT, ALBUMIN in the last 168 hours. No results for input(s): LIPASE, AMYLASE in the last 168 hours. No results for input(s): AMMONIA in the last 168 hours. Coagulation  Profile: No results for input(s): INR, PROTIME in the last 168 hours. Cardiac Enzymes: No results for input(s): CKTOTAL, CKMB, CKMBINDEX, TROPONINI in the last 168 hours. BNP (last 3 results) No results for input(s): PROBNP in the last 8760 hours. HbA1C: No results for input(s): HGBA1C in the last 72 hours. CBG: Recent Labs  Lab 11/08/23 1415  GLUCAP 135*   Lipid Profile: No results for input(s): CHOL, HDL, LDLCALC, TRIG, CHOLHDL, LDLDIRECT in the last 72 hours. Thyroid Function Tests: No results for input(s): TSH, T4TOTAL, FREET4, T3FREE, THYROIDAB in the last 72 hours. Anemia Panel: No results for input(s): VITAMINB12, FOLATE, FERRITIN, TIBC, IRON, RETICCTPCT in the last 72 hours. Urine analysis:    Component Value Date/Time   COLORURINE YELLOW 11/08/2023 1802   APPEARANCEUR CLEAR 11/08/2023 1802   LABSPEC 1.010 11/08/2023 1802   PHURINE 6.0 11/08/2023 1802   GLUCOSEU NEGATIVE 11/08/2023 1802   HGBUR NEGATIVE 11/08/2023 1802   HGBUR small 07/06/2008 1449   BILIRUBINUR NEGATIVE 11/08/2023 1802   KETONESUR 5 (A) 11/08/2023 1802   PROTEINUR NEGATIVE 11/08/2023 1802   UROBILINOGEN 1.0 12/29/2013 1751   NITRITE NEGATIVE 11/08/2023 1802   LEUKOCYTESUR NEGATIVE 11/08/2023 1802    Radiological Exams on Admission: CT Angio Chest PE W and/or Wo Contrast Result Date: 11/08/2023 CLINICAL DATA:  Syncope versus seizure. EXAM: CT ANGIOGRAPHY CHEST WITH CONTRAST TECHNIQUE: Multidetector CT imaging of the chest was performed using the standard protocol during bolus administration of intravenous contrast. Multiplanar CT image reconstructions and MIPs were obtained to evaluate the vascular anatomy. RADIATION DOSE REDUCTION: This exam was performed according to the departmental dose-optimization program which includes automated exposure control, adjustment of the mA and/or kV according to patient size and/or use of iterative reconstruction  technique. CONTRAST:  75mL OMNIPAQUE  IOHEXOL  350 MG/ML SOLN COMPARISON:  None Available. FINDINGS: Cardiovascular: There is marked severity calcification of the aortic arch, without evidence of aortic aneurysm. Satisfactory opacification of the pulmonary arteries to the segmental level. No evidence of pulmonary embolism. Normal heart size. No pericardial effusion. Mediastinum/Nodes: No enlarged mediastinal, hilar, or axillary lymph nodes. Thyroid gland, trachea, and esophagus demonstrate no significant findings. Lungs/Pleura: The lungs are hyperinflated with moderate severity emphysematous lung disease. Mild linear scarring and/or atelectasis is seen within the anterior lateral aspect of the right lung base. There is no evidence of an acute infiltrate, pleural effusion or pneumothorax. Upper Abdomen:  No acute abnormality. Musculoskeletal: Mild chronic wedging of the T12 vertebral body is seen. Multilevel degenerative changes are noted throughout the thoracic spine. Review of the MIP images confirms the above findings. IMPRESSION: 1. No evidence of pulmonary embolism or other acute intrathoracic process. 2. Moderate severity emphysematous lung disease. 3. Mild linear scarring and/or atelectasis within the anterior lateral aspect of the right lung base. 4. Mild chronic wedging of the T12 vertebral body. 5. Aortic atherosclerosis. Aortic Atherosclerosis (ICD10-I70.0) and Emphysema (ICD10-J43.9). Electronically Signed   By: Suzen Dials M.D.   On: 11/08/2023 20:53   CT Head Wo Contrast Result Date: 11/08/2023 CLINICAL DATA:  Seizure EXAM: CT HEAD WITHOUT CONTRAST TECHNIQUE: Contiguous axial images were obtained from the base of the skull through the vertex without intravenous contrast. RADIATION DOSE REDUCTION: This exam was performed according to the departmental dose-optimization program which includes automated exposure control, adjustment of the mA and/or kV according to patient size and/or use of iterative  reconstruction technique. COMPARISON:  None Available. FINDINGS: Brain: There is no mass, hemorrhage or extra-axial collection. There is generalized atrophy without lobar predilection. Hypodensity of the white matter is most commonly associated with chronic microvascular disease. Vascular: Atherosclerotic calcification of the internal carotid arteries at the skull base. No abnormal hyperdensity of the major intracranial arteries or dural venous sinuses. Skull: The visualized skull base, calvarium and extracranial soft tissues are normal. Sinuses/Orbits: No fluid levels or advanced mucosal thickening of the visualized paranasal sinuses. No mastoid or middle ear effusion. Normal orbits. Other: None. IMPRESSION: 1. No acute intracranial abnormality. 2. Generalized atrophy and findings of chronic microvascular disease. Electronically Signed   By: Franky Stanford M.D.   On: 11/08/2023 20:41   DG Chest 2 View Result Date: 11/08/2023 CLINICAL DATA:  Syncope.  Loss of consciousness. EXAM: CHEST - 2 VIEW COMPARISON:  06/20/2022. FINDINGS: Redemonstration of a well-circumscribed nodule seen overlying the upper lobes on the lateral film measuring 10 x 13 mm. The nodule is not distinctly seen on the PA projection. When compared to the prior exam, the nodule appears increased in size. Further evaluation with chest CT scan is again recommended. Bilateral lungs appear hyperexpanded and hyperlucent with coarse bronchovascular markings, in keeping with COPD. Bilateral lungs otherwise appear clear. No dense consolidation or lung collapse. Bilateral costophrenic angles are clear. Stable cardio-mediastinal silhouette. No acute osseous abnormalities. The soft tissues are within normal limits. IMPRESSION: 1. Redemonstration of a well-circumscribed nodule overlying the upper lobes on the lateral film measuring 1.0 x 1.3 cm, increased in size since the prior study. Further evaluation with chest CT scan is again recommended. 2. COPD. 3. No  acute cardiopulmonary abnormality. Electronically Signed   By: Ree Molt M.D.   On: 11/08/2023 16:48      Assessment/Plan    Principal Problem:   Syncope Active Problems:   SIRS (systemic inflammatory response syndrome) (HCC)   Leukocytosis   Prolonged QT interval   History of COPD   Depression    #) Syncope: 1 episode of loss of consciousness that occurred while seated eating a meal and without associated prodrome.  Given the timing of this episode of loss of consciousness, there may be contributions from a transient relative shift in blood volume to the enteric vasculature compounded by likely baseline relative right-sided circulation shift and the setting of her moderately severe COPD, superimposed on oxygen saturations in the low 90s on room air, with increased risk for diminished oxygen delivery capacity as a consequence thereof.  Additionally, EDP  conveyed that the patient showed evidence of positive orthostatic vital signs in the ED prior to initiation of IV fluids.  Will plan to repeat orthostatic vital signs in the morning.  ACS appears less likely in the absence of any associated, preceding, or ensuing chest discomfort, We will high-sensitivity troponin I is nonelevated and EKG shows no evidence of acute ischemic changes, including no evidence of STEMI.  Will continue to trend troponin and pursue echocardiogram in the morning.  No clinical or physical exam evidence to suggest seizure or contributory acute stroke, while noting that CT head shows no evidence of acute intracranial process.  Her presenting QTc prolongation is notable, predisposing her to ventricular arrhythmia in the form of torsades de point.  Magnesium  level with plan for prn supplementation in order to maintain associated value of greater than or equal to 2.0.  Plan: Monitor on telemetry.  Trend troponin.  Echocardiogram ordered in the morning.  Add on serum magnesium  level.  Potassium chloride  40 mill colons  p.o. x 1 dose now.  CMP in the morning.  Repeat CBC in the morning.  Normal saline x 500 cc bolus followed by continuous NS running at 75 cc/h.  Orthostatic vital signs ordered for the morning.  Fall precautions ordered.  Repeat EKG in the morning to further trend degree of QTc prolongation.                #) SIRS criteria present: Presentation associated with mildly elevated white blood cell count, as well as mild tachypnea.  However, in the absence of e/o underlying infection at this time, including, CTA chest that showed no evidence of acute cardiopulmonary process, including no evidence of infiltrate, while urinalysis was inconsistent with UTI, criteria for sepsis not currently met.  suspect non-infectious factors contributing to these SIRS findings, including suspected element of reactive leukocytosis as a consequence of her episode of syncope, as well as potential hemoconcentrated and contribution given clinical evidence of mild dehydration. patient appears hemodynamically stable at this time.  Consequently, will refrain from initiation of IV antibiotics at this time.   Plan: Repeat CBC with diff in the AM.  Monitor strict I's and O's and daily weights.  Monitor on telemetry. Refraining from IV abx for now, as above.  IV fluids, as above.                      #) QTc prolongation: Presenting EKG demonstrates QTc of 565 ms. outpatient medications that may be contributing to QTc prolongation: Trazodone , Celexa .  Notable in the context of her presenting episode of syncope without prodrome.  Plan: Monitor on telemetry.  Add-on serum magnesium  level.  Repeat EKG in the morning to monitor interval degree of QTc prolongation.  Hold outpatient Celexa  and trazodone .                 #) COPD: Documented history thereof, without clinical evidence of acute exacerbation at this time.   Outpatient respiratory regimen includes the following: Prn albuterol  inhaler.    Plan:  Prn albuterol  nebulizer. Check CMP and serum magnesium  level in the AM.                   #) Depression: documented h/o such. On  celexa  as outpatient, which will be held for now in the setting of QTc prolongation.  Plan: Hold home Celexa , as above.  Repeat EKG in the morning, as above.      DVT prophylaxis: SCD's  Code Status: Full code Family Communication: none Disposition Plan: Per Rounding Team Consults called: none;  Admission status: obs    I SPENT GREATER THAN 75  MINUTES IN CLINICAL CARE TIME/MEDICAL DECISION-MAKING IN COMPLETING THIS ADMISSION.     Cisco Kindt B Tanyia Grabbe DO [facility] From 7PM - 7AM   11/08/2023, 9:45 PM

## 2023-11-08 NOTE — ED Notes (Signed)
 Smoker, SPO2 91% RA

## 2023-11-08 NOTE — ED Notes (Signed)
 EDP in to see, at [facility].

## 2023-11-08 NOTE — ED Notes (Signed)
 Sob/ doe, "overwhelmed going to the b/r", de-satted upon return to stretcher and monitor, SPO2 72% RA, improving with rest, will monitor. Up to 92% RA.

## 2023-11-08 NOTE — ED Notes (Signed)
 EDP at [facility]

## 2023-11-08 NOTE — ED Notes (Signed)
 Pt ambulatory to bathroom

## 2023-11-08 NOTE — ED Triage Notes (Addendum)
 Patient BIB EMS for an episode of syncope vs seizure. Nurse and athletic trainer that witnessed episode said it seemed like a absence seizure. GCS of 14 on arrival.  On EMS arrival BP was 80s/50s. Given bolus. BP 134/76 on arrival.

## 2023-11-08 NOTE — ED Provider Notes (Signed)
 [place] EMERGENCY DEPARTMENT AT [facility] Provider Note   CSN: [id number] Arrival date & time: 11/08/23  1357     History  Chief Complaint  Patient presents with   Loss of Consciousness    Melanie Caldwell is a 68 y.o. female.  68 year old female presenting emergency department after a syncopal event.  States she was in her usual state of health when she awoke this morning.  Then after lunch she had an episode where she seemingly running responsive for several seconds.  She is not quite sure what happened and does not recall preceding symptoms prior to the event.  States she feels off but has a hard time elaborating what that means.  She reports history of abnormal heartbeat, but does not know what.  Did not hit her head and was reportedly in a chair for entire episode.   Loss of Consciousness      Home Medications Prior to Admission medications   Medication Sig Start Date End Date Taking? Authorizing Provider  ADVIL  200 MG tablet Take 200-600 mg by mouth every 6 (six) hours as needed for mild pain or headache.   Yes [provider]  citalopram  (CELEXA ) 20 MG tablet Take 20 mg by mouth daily.   Yes [provider]  hydrOXYzine  (ATARAX ) 25 MG tablet Take 1 tablet (25 mg total) by mouth at bedtime as needed. Patient taking differently: Take 25 mg by mouth at bedtime as needed for anxiety. 06/20/22  Yes Bernis Ernst, PA-C  traZODone  (DESYREL ) 50 MG tablet Take 25 mg by mouth at bedtime. 04/30/17  Yes [provider]  DimenhyDRINATE (DRAMAMINE PO) Take 1 tablet by mouth 2 (two) times daily as needed (upset stomach).    [provider]  omeprazole  (PRILOSEC) 40 MG capsule Take 1 capsule (40 mg total) by mouth daily. 09/02/16   Aneita Gwendlyn DASEN, MD  ondansetron  (ZOFRAN  ODT) 4 MG disintegrating tablet Take 1 tablet (4 mg total) by mouth every 8 (eight) hours as needed for nausea or vomiting. 05/24/21   Odell Balls, PA-C  ondansetron   (ZOFRAN ) 4 MG tablet Take 1 tablet (4 mg total) by mouth every 8 (eight) hours as needed for nausea or vomiting. 06/30/22   Charlyn Sora, MD  potassium chloride  (K-DUR) 10 MEQ tablet Take 1 tablet (10 mEq total) by mouth daily. 05/03/17   Layden, Lindsey A, PA-C  potassium chloride  SA (KLOR-CON ) 20 MEQ tablet Take 1 tablet (20 mEq total) by mouth 2 (two) times daily. 05/24/21   Odell Balls, PA-C  promethazine  (PHENERGAN ) 25 MG tablet take 1 tablet by mouth twice a day if needed for nausea and vomiting Patient taking differently: Take 25 mg by mouth 2 (two) times daily as needed for nausea or vomiting. 01/15/17   Aneita Gwendlyn DASEN, MD  rosuvastatin (CRESTOR) 10 MG tablet Take 10 mg by mouth 2 (two) times a week.    [provider]      Allergies    Sulfamethoxazole -trimethoprim , Adhesive [tape], Ceftin [cefuroxime axetil], and Codeine    Review of Systems   Review of Systems  Cardiovascular:  Positive for syncope.    Physical Exam Updated Vital Signs BP 97/60   Pulse 81   Temp 97.9 F (36.6 C) (Oral)   Resp 20   Ht 4' 10 (1.473 m)   Wt 47.6 kg   SpO2 99%   BMI 21.95 kg/m  Physical Exam Vitals and nursing note reviewed.  Constitutional:      General: She  is not in acute distress.    Appearance: She is not toxic-appearing.  HENT:     Head: Normocephalic and atraumatic.     Nose: Nose normal.     Mouth/Throat:     Mouth: Mucous membranes are moist.  Eyes:     Conjunctiva/sclera: Conjunctivae normal.  Cardiovascular:     Rate and Rhythm: Normal rate and regular rhythm.  Pulmonary:     Effort: Pulmonary effort is normal.     Breath sounds: Normal breath sounds.  Abdominal:     General: Abdomen is flat. There is no distension.     Tenderness: There is no abdominal tenderness. There is no guarding or rebound.  Musculoskeletal:        General: Normal range of motion.  Skin:    General: Skin is warm and dry.     Capillary Refill: Capillary refill takes less than 2  seconds.  Neurological:     General: No focal deficit present.     Mental Status: She is alert and oriented to person, place, and time.  Psychiatric:        Mood and Affect: Mood normal.        Behavior: Behavior normal.     ED Results / Procedures / Treatments   Labs (all labs ordered are listed, but only abnormal results are displayed) Labs Reviewed  BASIC METABOLIC PANEL - Abnormal; Notable for the following components:      Result Value   Glucose, Bld 156 (*)    Calcium  8.7 (*)    All other components within normal limits  CBC - Abnormal; Notable for the following components:   WBC 15.9 (*)    All other components within normal limits  URINALYSIS, ROUTINE W REFLEX MICROSCOPIC - Abnormal; Notable for the following components:   Ketones, ur 5 (*)    All other components within normal limits  DIFFERENTIAL - Abnormal; Notable for the following components:   Neutro Abs 13.2 (*)    All other components within normal limits  CBG MONITORING, ED - Abnormal; Notable for the following components:   Glucose-Capillary 135 (*)    All other components within normal limits  TROPONIN I (HIGH SENSITIVITY)    EKG EKG Interpretation Date/Time:  Monday November 08 2023 14:27:55 EST Ventricular Rate:  82 PR Interval:  151 QRS Duration:  82 QT Interval:  483 QTC Calculation: 565 R Axis:   188  Text Interpretation: Sinus rhythm Right atrial enlargement Pattern suggests chronic pulmonary disease Nonspecific T abnormalities, anterior leads Prolonged QT interval Confirmed by Neysa Clap [phone]) on 11/08/2023 3:04:45 PM  Radiology CT Angio Chest PE W and/or Wo Contrast Result Date: 11/08/2023 CLINICAL DATA:  Syncope versus seizure. EXAM: CT ANGIOGRAPHY CHEST WITH CONTRAST TECHNIQUE: Multidetector CT imaging of the chest was performed using the standard protocol during bolus administration of intravenous contrast. Multiplanar CT image reconstructions and MIPs were obtained to evaluate the  vascular anatomy. RADIATION DOSE REDUCTION: This exam was performed according to the departmental dose-optimization program which includes automated exposure control, adjustment of the mA and/or kV according to patient size and/or use of iterative reconstruction technique. CONTRAST:  75mL OMNIPAQUE  IOHEXOL  350 MG/ML SOLN COMPARISON:  None Available. FINDINGS: Cardiovascular: There is marked severity calcification of the aortic arch, without evidence of aortic aneurysm. Satisfactory opacification of the pulmonary arteries to the segmental level. No evidence of pulmonary embolism. Normal heart size. No pericardial effusion. Mediastinum/Nodes: No enlarged mediastinal, hilar, or axillary lymph nodes. Thyroid gland, trachea, and esophagus  demonstrate no significant findings. Lungs/Pleura: The lungs are hyperinflated with moderate severity emphysematous lung disease. Mild linear scarring and/or atelectasis is seen within the anterior lateral aspect of the right lung base. There is no evidence of an acute infiltrate, pleural effusion or pneumothorax. Upper Abdomen: No acute abnormality. Musculoskeletal: Mild chronic wedging of the T12 vertebral body is seen. Multilevel degenerative changes are noted throughout the thoracic spine. Review of the MIP images confirms the above findings. IMPRESSION: 1. No evidence of pulmonary embolism or other acute intrathoracic process. 2. Moderate severity emphysematous lung disease. 3. Mild linear scarring and/or atelectasis within the anterior lateral aspect of the right lung base. 4. Mild chronic wedging of the T12 vertebral body. 5. Aortic atherosclerosis. Aortic Atherosclerosis (ICD10-I70.0) and Emphysema (ICD10-J43.9). Electronically Signed   By: Suzen Dials M.D.   On: 11/08/2023 20:53   CT Head Wo Contrast Result Date: 11/08/2023 CLINICAL DATA:  Seizure EXAM: CT HEAD WITHOUT CONTRAST TECHNIQUE: Contiguous axial images were obtained from the base of the skull through the  vertex without intravenous contrast. RADIATION DOSE REDUCTION: This exam was performed according to the departmental dose-optimization program which includes automated exposure control, adjustment of the mA and/or kV according to patient size and/or use of iterative reconstruction technique. COMPARISON:  None Available. FINDINGS: Brain: There is no mass, hemorrhage or extra-axial collection. There is generalized atrophy without lobar predilection. Hypodensity of the white matter is most commonly associated with chronic microvascular disease. Vascular: Atherosclerotic calcification of the internal carotid arteries at the skull base. No abnormal hyperdensity of the major intracranial arteries or dural venous sinuses. Skull: The visualized skull base, calvarium and extracranial soft tissues are normal. Sinuses/Orbits: No fluid levels or advanced mucosal thickening of the visualized paranasal sinuses. No mastoid or middle ear effusion. Normal orbits. Other: None. IMPRESSION: 1. No acute intracranial abnormality. 2. Generalized atrophy and findings of chronic microvascular disease. Electronically Signed   By: Franky Stanford M.D.   On: 11/08/2023 20:41   DG Chest 2 View Result Date: 11/08/2023 CLINICAL DATA:  Syncope.  Loss of consciousness. EXAM: CHEST - 2 VIEW COMPARISON:  06/20/2022. FINDINGS: Redemonstration of a well-circumscribed nodule seen overlying the upper lobes on the lateral film measuring 10 x 13 mm. The nodule is not distinctly seen on the PA projection. When compared to the prior exam, the nodule appears increased in size. Further evaluation with chest CT scan is again recommended. Bilateral lungs appear hyperexpanded and hyperlucent with coarse bronchovascular markings, in keeping with COPD. Bilateral lungs otherwise appear clear. No dense consolidation or lung collapse. Bilateral costophrenic angles are clear. Stable cardio-mediastinal silhouette. No acute osseous abnormalities. The soft tissues are  within normal limits. IMPRESSION: 1. Redemonstration of a well-circumscribed nodule overlying the upper lobes on the lateral film measuring 1.0 x 1.3 cm, increased in size since the prior study. Further evaluation with chest CT scan is again recommended. 2. COPD. 3. No acute cardiopulmonary abnormality. Electronically Signed   By: Ree Molt M.D.   On: 11/08/2023 16:48    Procedures Procedures    Medications Ordered in ED Medications  sodium chloride  0.9 % bolus 500 mL (has no administration in time range)  0.9 %  sodium chloride  infusion (has no administration in time range)  ondansetron  (ZOFRAN -ODT) disintegrating tablet 4 mg (4 mg Oral Given 11/08/23 1430)  sodium chloride  0.9 % bolus 500 mL (0 mLs Intravenous Stopped 11/08/23 1648)  sodium chloride  0.9 % bolus 500 mL (0 mLs Intravenous Stopped 11/08/23 1818)  iohexol  (OMNIPAQUE ) 350  MG/ML injection 75 mL (75 mLs Intravenous Contrast Given 11/08/23 1933)    ED Course/ Medical Decision Making/ A&P Clinical Course as of 11/08/23 2141  Mon Nov 08, 2023  1505 Per chart review 2 negative CT heads and negative MRI in the past 2 years.  [TY]  1621 DG Chest 2 View Reviewed. Independent interpretation.  No overt pneumonia.  No pneumothorax or widened mediastinum. [TY]  [id number] Nurse reported patient oxygen levels have been around 91% and had desaturations with good waveform down into the 80s.  Placed on supplemental nasal cannula with improvement to the upper 90s at rest.  Will get CTA as her chest x-ray does not appear to have overt pneumonia.  Lungs are clear to auscultation.  [TY]    Clinical Course User Index [TY] Neysa Caron PARAS, DO                                 Medical Decision Making This is a 68 year old female presenting emergency department after what sounds like a syncopal event after eating lunch.  EMS reported that she was hypotensive the blood pressure in the 80s systolic on their arrival.  Gave 500 ml of IV fluids prior to  arrival.  Patient has been normotensive here.  Also afebrile.  Appears to be normal sinus rhythm on the monitor maintaining oxygen saturation on room air.  She has no localizing neurodeficits on exam.  She reports history of abnormal heartbeat however I do not see history of any arrhythmia.  It seems her last provocative cardiac testing was in 2006.  She has not seen a cardiologist in years.  There was report of possible absence type seizure surrounding event, however descriptions are quite unclear other than patient lost consciousness.  CT head ordered.  Exam nonfocal.  Low suspicion for acute stroke.  Her troponin negative.  EKG normal sinus rhythm without ST segment changes to indicate ischemia.  She has a mild leukocytosis no obvious source of infection.  Chest x-ray clear urine without UTI.  She had no significant metabolic derangements.  Normal kidney function.  Given patient's age and comorbid medical conditions concern for possible cardiac etiology.  Patient also borderline hypoxic to 91% at rest, did desat with ambulation and placed on nasal cannula.  CTA negative for PE or pneumonia.  Lungs clear.  CT head negative for acute pathology.  Will admit patient for for syncope workup  Amount and/or Complexity of Data Reviewed Labs: ordered. Radiology: ordered. Decision-making details documented in ED Course.  Risk Prescription drug management. Decision regarding hospitalization.         Final Clinical Impression(s) / ED Diagnoses Final diagnoses:  Lung nodule  Syncope and collapse    Rx / DC Orders ED Discharge Orders     None         Neysa Caron PARAS, DO 11/08/23 2141

## 2023-11-08 NOTE — ED Notes (Signed)
 Pt given meal tray.

## 2023-11-08 NOTE — ED Notes (Signed)
 Pt wanting to go home. EDP at [facility].

## 2023-11-09 ENCOUNTER — Observation Stay (HOSPITAL_COMMUNITY): Payer: Medicare Other

## 2023-11-09 ENCOUNTER — Other Ambulatory Visit (HOSPITAL_COMMUNITY): Payer: Self-pay

## 2023-11-09 ENCOUNTER — Other Ambulatory Visit: Payer: Self-pay

## 2023-11-09 DIAGNOSIS — Z8709 Personal history of other diseases of the respiratory system: Secondary | ICD-10-CM

## 2023-11-09 DIAGNOSIS — R9431 Abnormal electrocardiogram [ECG] [EKG]: Secondary | ICD-10-CM

## 2023-11-09 DIAGNOSIS — D72829 Elevated white blood cell count, unspecified: Secondary | ICD-10-CM | POA: Diagnosis present

## 2023-11-09 DIAGNOSIS — F32A Depression, unspecified: Secondary | ICD-10-CM | POA: Diagnosis present

## 2023-11-09 DIAGNOSIS — R55 Syncope and collapse: Secondary | ICD-10-CM | POA: Diagnosis not present

## 2023-11-09 DIAGNOSIS — R6510 Systemic inflammatory response syndrome (SIRS) of non-infectious origin without acute organ dysfunction: Secondary | ICD-10-CM | POA: Diagnosis present

## 2023-11-09 DIAGNOSIS — I361 Nonrheumatic tricuspid (valve) insufficiency: Secondary | ICD-10-CM | POA: Diagnosis not present

## 2023-11-09 HISTORY — DX: Abnormal electrocardiogram (ECG) (EKG): R94.31

## 2023-11-09 HISTORY — DX: Systemic inflammatory response syndrome (sirs) of non-infectious origin without acute organ dysfunction: R65.10

## 2023-11-09 LAB — COMPREHENSIVE METABOLIC PANEL
ALT: 13 U/L (ref 0–44)
AST: 14 U/L — ABNORMAL LOW (ref 15–41)
Albumin: 3.2 g/dL — ABNORMAL LOW (ref 3.5–5.0)
Alkaline Phosphatase: 65 U/L (ref 38–126)
Anion gap: 4 — ABNORMAL LOW (ref 5–15)
BUN: 6 mg/dL — ABNORMAL LOW (ref 8–23)
CO2: 25 mmol/L (ref 22–32)
Calcium: 8.5 mg/dL — ABNORMAL LOW (ref 8.9–10.3)
Chloride: 109 mmol/L (ref 98–111)
Creatinine, Ser: 0.46 mg/dL (ref 0.44–1.00)
GFR, Estimated: >60 mL/min (ref >60)
Glucose, Bld: 77 mg/dL (ref 70–99)
Potassium: 4.4 mmol/L (ref 3.5–5.1)
Sodium: 138 mmol/L (ref 135–145)
Total Bilirubin: 0.7 mg/dL (ref 0.0–1.2)
Total Protein: 5.8 g/dL — ABNORMAL LOW (ref 6.5–8.1)

## 2023-11-09 LAB — ECHOCARDIOGRAM COMPLETE
AR max vel: 2.42 cm2
AV Area VTI: 2.2 cm2
AV Area mean vel: 2.23 cm2
AV Mean grad: 3 mm[Hg]
AV Peak grad: 5.4 mm[Hg]
Ao pk vel: 1.16 m/s
Area-P 1/2: 3.58 cm2
Height: 58 in
S' Lateral: 1.8 cm
Weight: 1680 [oz_av]

## 2023-11-09 LAB — CBC WITH DIFFERENTIAL/PLATELET
Abs Immature Granulocytes: 0.02 10*3/uL (ref 0.00–0.07)
Basophils Absolute: 0.1 10*3/uL (ref 0.0–0.1)
Basophils Relative: 1 %
Eosinophils Absolute: 0.1 10*3/uL (ref 0.0–0.5)
Eosinophils Relative: 1 %
HCT: 38.7 % (ref 36.0–46.0)
Hemoglobin: 12.4 g/dL (ref 12.0–15.0)
Immature Granulocytes: 0 %
Lymphocytes Relative: 23 %
Lymphs Abs: 2.3 10*3/uL (ref 0.7–4.0)
MCH: 30.6 pg (ref 26.0–34.0)
MCHC: 32 g/dL (ref 30.0–36.0)
MCV: 95.6 fL (ref 80.0–100.0)
Monocytes Absolute: 0.9 10*3/uL (ref 0.1–1.0)
Monocytes Relative: 9 %
Neutro Abs: 6.7 10*3/uL (ref 1.7–7.7)
Neutrophils Relative %: 66 %
Platelets: 163 10*3/uL (ref 150–400)
RBC: 4.05 MIL/uL (ref 3.87–5.11)
RDW: 12.5 % (ref 11.5–15.5)
WBC: 10.1 10*3/uL (ref 4.0–10.5)
nRBC: 0 % (ref 0.0–0.2)

## 2023-11-09 LAB — TROPONIN I (HIGH SENSITIVITY)
Troponin I (High Sensitivity): 8 ng/L (ref <18)
Troponin I (High Sensitivity): 6 ng/L (ref <18)

## 2023-11-09 LAB — PHOSPHORUS: Phosphorus: 2.7 mg/dL (ref 2.5–4.6)

## 2023-11-09 LAB — MAGNESIUM: Magnesium: 2.1 mg/dL (ref 1.7–2.4)

## 2023-11-09 MED ORDER — TRAZODONE HCL 50 MG PO TABS
25.0000 mg | ORAL_TABLET | Freq: Every day | ORAL | 1 refills | Status: DC
  Filled 2023-11-09: qty 15, 30d supply, fill #0

## 2023-11-09 MED ORDER — TRAZODONE HCL 50 MG PO TABS
25.0000 mg | ORAL_TABLET | Freq: Every day | ORAL | Status: DC
Start: 2023-11-09 — End: 2023-11-09

## 2023-11-09 MED ORDER — CITALOPRAM HYDROBROMIDE 20 MG PO TABS
20.0000 mg | ORAL_TABLET | Freq: Every day | ORAL | 1 refills | Status: DC
  Filled 2023-11-09: qty 30, 30d supply, fill #0

## 2023-11-09 MED ORDER — DOXYCYCLINE HYCLATE 100 MG PO TABS
100.0000 mg | ORAL_TABLET | Freq: Two times a day (BID) | ORAL | 0 refills | Status: AC
  Filled 2023-11-09: qty 13, 7d supply, fill #0

## 2023-11-09 MED ORDER — PREDNISONE 20 MG PO TABS
40.0000 mg | ORAL_TABLET | Freq: Every day | ORAL | 0 refills | Status: AC
  Filled 2023-11-09: qty 10, 5d supply, fill #0

## 2023-11-09 MED ORDER — HYDROXYZINE HCL 25 MG PO TABS: 25.0000 mg | ORAL_TABLET | Freq: Every evening | ORAL | Status: DC | PRN

## 2023-11-09 MED ORDER — PREDNISONE 20 MG PO TABS: 40.0000 mg | ORAL_TABLET | Freq: Every day | ORAL | Status: DC

## 2023-11-09 MED ORDER — LORAZEPAM 1 MG PO TABS
1.0000 mg | ORAL_TABLET | Freq: Four times a day (QID) | ORAL | Status: DC | PRN
  Administered 2023-11-09 (×2): 1 mg via ORAL
  Filled 2023-11-09 (×2): qty 1

## 2023-11-09 MED ORDER — ALBUTEROL SULFATE (2.5 MG/3ML) 0.083% IN NEBU
2.5000 mg | INHALATION_SOLUTION | RESPIRATORY_TRACT | Status: DC | PRN
  Administered 2023-11-09: 2.5 mg via RESPIRATORY_TRACT
  Filled 2023-11-09: qty 3

## 2023-11-09 MED ORDER — DOXYCYCLINE HYCLATE 100 MG PO TABS
100.0000 mg | ORAL_TABLET | Freq: Two times a day (BID) | ORAL | Status: DC
  Administered 2023-11-09: 100 mg via ORAL
  Filled 2023-11-09: qty 1

## 2023-11-09 MED ORDER — METHYLPREDNISOLONE SODIUM SUCC 125 MG IJ SOLR
125.0000 mg | Freq: Once | INTRAMUSCULAR | Status: AC
  Administered 2023-11-09: 125 mg via INTRAVENOUS
  Filled 2023-11-09: qty 2

## 2023-11-09 MED ORDER — HYDROXYZINE HCL 25 MG PO TABS
25.0000 mg | ORAL_TABLET | Freq: Every evening | ORAL | 1 refills | Status: DC | PRN
  Filled 2023-11-09: qty 30, 30d supply, fill #0

## 2023-11-09 MED ORDER — CITALOPRAM HYDROBROMIDE 10 MG PO TABS
20.0000 mg | ORAL_TABLET | Freq: Every day | ORAL | Status: DC
  Administered 2023-11-09: 20 mg via ORAL
  Filled 2023-11-09: qty 2

## 2023-11-09 MED ORDER — ALBUTEROL SULFATE (2.5 MG/3ML) 0.083% IN NEBU
2.5000 mg | INHALATION_SOLUTION | RESPIRATORY_TRACT | 12 refills | Status: DC | PRN
  Filled 2023-11-09: qty 75, 3d supply, fill #0

## 2023-11-09 NOTE — Care Management Obs Status (Signed)
 MEDICARE OBSERVATION STATUS NOTIFICATION   Patient Details  Name: ELYSIA GRAND MRN: [id number] Date of Birth: [birth year]   Medicare Observation Status Notification Given:  Yes    Lavenia Atlas, RN 11/09/2023, 2:43 PM

## 2023-11-09 NOTE — ED Notes (Signed)
 Pt has ripped out their IV.

## 2023-11-09 NOTE — Care Management (Addendum)
    Transition of Care [facility]) - Emergency Department Mini Assessment   Patient Details  Name: Melanie Caldwell MRN: [id number] Date of Birth: [birth year]  Transition of Care [facility]) CM/SW Contact:    Stefan JONELLE Cory, RN Phone Number: 11/09/2023, 2:16 PM   Clinical Narrative: Per chart review patient currently at [facility] ED for syncope. This RNCM spoke with patient and friend [name] at bedside to offer choice for ordered DME: home nebulizer. Patient chose any DME vendor that will accept her insurance. Patient reports she resides at ALF: [facility]. Patient's friend inquired about HHPT/OT and patient is in agreement. This RNCM explained due to patient's insurance it may be a challenge to establish a [facility] agency however patient will need to follow up with PCP post discharge. Patient and friend [name] verbalized understanding. Offer choice for HHPT/OT, patient chose any HH agency that will accept her insurance. Notified MD for HHPT/OT orders.     This RNCM spoke with Jermaine with [employer] who will assist with providing home nebulizer prior to patient being discharged home. Awaiting home nebulizer to be delivered to bedside. RN is aware.   Transportation at discharge: friend [name]  TOC will continue to follow. -2:57pm This RNCM spoke with Amy with [facility] who will follow for HHPT/OT post discharge. Awaiting HH orders. No additional TOC needs.    ED Mini Assessment: What brought you to the Emergency Department? : patient has c/o episode of syncope vs seizure.  Barriers to Discharge: No Barriers Identified  Barrier interventions: coordinating DME: home nebulizer  Means of departure: Car  Interventions which prevented an admission or readmission: DME Provided    Patient Contact and Communications        ,          Patient states their goals for this hospitalization and ongoing recovery are:: to feel better CMS Medicare.gov Compare Post Acute Care list provided to:: Patient Choice  offered to / list presented to : [facility] POA / Guardian, Patient  Admission diagnosis:  Syncope [R55] Patient Active Problem List   Diagnosis Date Noted   SIRS (systemic inflammatory response syndrome) (HCC) 11/09/2023   Leukocytosis 11/09/2023   Prolonged QT interval 11/09/2023   History of COPD 11/09/2023   Depression 11/09/2023   Syncope 11/08/2023   Adjustment disorder with depressed mood 04/19/2018   GERD 10/16/2010   CHEST PAIN UNSPECIFIED 10/16/2010   NAUSEA AND VOMITING 10/16/2010   ABDOMINAL PAIN, EPIGASTRIC 10/16/2010   DIARRHEA 02/12/2009   Asthma 10/01/2008   LEG PAIN, BILATERAL 10/01/2008   TOBACCO ABUSE 08/16/2008   SHORTNESS OF BREATH 08/16/2008   CHEST PAIN, ATYPICAL 08/16/2008   RHINOSINUSITIS, ACUTE 07/25/2008   FIBROIDS, UTERUS 07/12/2008   Constipation 07/12/2008   Abdominal pain, other specified site 07/06/2008   PCP:  Nichole Senior, MD Pharmacy:   [facility] [id number] [address] [place],  - [address] AT [facility] [address] [place] [address] Phone: [phone] Fax: [phone]  [place] [facility] [address] [place] [address] Phone: [phone] Fax: [phone]

## 2023-11-09 NOTE — ED Notes (Signed)
 Pt complaining of nausea. PRN given.

## 2023-11-09 NOTE — Discharge Summary (Signed)
 Physician Discharge Summary   Patient: Melanie Caldwell MRN: [id number] DOB: 1956-04-20  Admit date:     11/08/2023  Discharge date: 11/09/23  Discharge Physician: Delon Herald   PCP: Nichole Senior, MD   Recommendations at discharge:   Take doxycycline  twice daily for 7 days (starting tonight) and prednisone  once daily for 5 days (starting tomorrow) for COPD exacerbation. Follow up with Dr. Nichole in 1-2 weeks. Follow up with cardiology  Discharge Diagnoses: Principal Problem:   Syncope Active Problems:   SIRS (systemic inflammatory response syndrome) (HCC)   Leukocytosis   Prolonged QT interval   History of COPD   Depression   Hospital Course: 68yo with h/o COPD and depression who was admitted on 1/13 with syncope while eating at her ALF.    Assessment and Plan:  Syncope Syncope appears to be related to developing COPD exacerbation She was observed overnight with no further issues other than mild developing respiratory symptoms Echo pending; assuming this is unremarkable, will dc to home   Prolonged QTc Resolved on AM EKG   COPD Appears to have developing exacerbation - increasing cough, mild DOE Wean off [place] O2 Start doxy BID x 7 days and prednisone  daily x 5 days (after initial Solumedrol dose today)  Depression Continue citalopram , hydroxyzine , and trazodone   R heart failure Likely related to COPD Appears to be compensated clinically Outpatient cardiology f/u      Consultants: None   Procedures: Echocardiogram 1/14   Antibiotics: Doxycycline  1/14-21    Pain control - [place]  Controlled Substance Reporting System database was reviewed. and patient was instructed, not to drive, operate heavy machinery, perform activities at heights, swimming or participation in water activities or provide baby-sitting services while on Pain, Sleep and Anxiety Medications; until their outpatient Physician has advised to do so again. Also recommended to not to  take more than prescribed Pain, Sleep and Anxiety Medications.   Disposition: Assisted living Diet recommendation:  Regular diet DISCHARGE MEDICATION: Allergies as of 11/09/2023       Reactions   Sulfamethoxazole -trimethoprim  Rash   Adhesive [tape] Other (See Comments)   Blisters with certain tapes   Ceftin [cefuroxime Axetil] Hives   Codeine Nausea And Vomiting        Medication List     TAKE these medications    Advil  200 MG tablet Generic drug: ibuprofen  Take 200-600 mg by mouth every 6 (six) hours as needed for mild pain or headache.   albuterol  (2.5 MG/3ML) 0.083% nebulizer solution Commonly known as: PROVENTIL  Take 3 mLs (2.5 mg total) by nebulization every 2 (two) hours as needed for wheezing or shortness of breath.   citalopram  20 MG tablet Commonly known as: CELEXA  Take 1 tablet (20 mg total) by mouth daily.   doxycycline  100 MG tablet Commonly known as: VIBRA -TABS Take 1 tablet (100 mg total) by mouth every 12 (twelve) hours for 13 doses.   hydrOXYzine  25 MG tablet Commonly known as: ATARAX  Take 1 tablet (25 mg total) by mouth at bedtime as needed for anxiety.   predniSONE  20 MG tablet Commonly known as: DELTASONE  Take 2 tablets (40 mg total) by mouth daily with breakfast for 5 days. Start taking on: November 10, 2023   traZODone  50 MG tablet Commonly known as: DESYREL  Take 0.5 tablets (25 mg total) by mouth at bedtime.               Durable Medical Equipment  (From admission, onward)  Start     Ordered   11/09/23 0000  For home use only DME Nebulizer machine       Question Answer Comment  Patient needs a nebulizer to treat with the following condition COPD (chronic obstructive pulmonary disease) (HCC)   Length of Need Lifetime   Additional equipment included Administration kit      11/09/23 1354            Follow-up Information     Schedule an appointment as soon as possible for a visit  with Your primay doctor.                  Discharge Exam:  Subjective: Feeling ok, wants to go home today.  Some increased cough, currently on RA.   Objective: Vitals:   11/09/23 1300 11/09/23 1330  BP: 102/60   Pulse: 75 73  Resp: (!) 21 20  Temp:    SpO2: 99% 92%    Intake/Output Summary (Last 24 hours) at 11/09/2023 1357 Last data filed at 11/08/2023 2254 Gross per 24 hour  Intake 2000 ml  Output --  Net 2000 ml   Filed Weights   11/08/23 1410  Weight: 47.6 kg    Exam:  General:  Appears calm and comfortable and is in NAD Eyes:  EOMI, normal lids, iris ENT:  grossly normal hearing, lips & tongue, mmm Neck:  no LAD, masses or thyromegaly Cardiovascular:  RRR, no m/r/g. No LE edema.  Respiratory:   Scant expiratory wheezing.  Normal respiratory effort.  Some cough noted. Abdomen:  soft, NT, ND Skin:  no rash or induration seen on limited exam Musculoskeletal:  grossly normal tone BUE/BLE, good ROM, no bony abnormality Psychiatric:  blunted mood and affect, speech fluent and appropriate, AOx3 Neurologic:  CN 2-12 grossly intact, moves all extremities in coordinated fashion  Data Reviewed: I have reviewed the patient's lab results since admission.  Pertinent labs for today include:   Unremarkable CMP Normal CBC Troponin 8, 6    Condition at discharge: stable  The results of significant diagnostics from this hospitalization (including imaging, microbiology, ancillary and laboratory) are listed below for reference.   Imaging Studies: ECHOCARDIOGRAM COMPLETE Result Date: 11/09/2023    ECHOCARDIOGRAM REPORT   Patient Name:   Melanie Caldwell Rude Date of Exam: 11/09/2023 Medical Rec #:  [id number]       Height:       58.0 in Accession #:    [id number]      Weight:       105.0 lb Date of Birth:  1956-10-10      BSA:          1.385 m Patient Age:    68 years        BP:           114/76 mmHg Patient Gender: F               HR:           90 bpm. Exam Location:  Inpatient Procedure: 2D Echo,  Cardiac Doppler and Color Doppler Indications:    Atrial Fibrillation  History:        Patient has no prior history of Echocardiogram examinations.                 Risk Factors:Current Smoker.  Sonographer:    Ozell Free Referring Phys: [id number] EVA KATHEE PORE  Sonographer Comments: No parasternal window. Image acquisition challenging due to patient body habitus. IMPRESSIONS  1.  Left ventricular ejection fraction, by estimation, is 60 to 65%. The left ventricle has normal function. The left ventricle has no regional wall motion abnormalities. Left ventricular diastolic parameters were normal. There is the interventricular septum is flattened in systole and diastole, consistent with right ventricular pressure and volume overload.  2. Right ventricular systolic function is normal. The right ventricular size is severely enlarged. There is normal pulmonary artery systolic pressure.  3. The mitral valve is normal in structure. No evidence of mitral valve regurgitation. No evidence of mitral stenosis.  4. The aortic valve was not well visualized. Aortic valve regurgitation is not visualized. No aortic stenosis is present.  5. The inferior vena cava is normal in size with greater than 50% respiratory variability, suggesting right atrial pressure of 3 mmHg. Comparison(s): No prior Echocardiogram. FINDINGS  Left Ventricle: Left ventricular ejection fraction, by estimation, is 60 to 65%. The left ventricle has normal function. The left ventricle has no regional wall motion abnormalities. The left ventricular internal cavity size was small. There is no left ventricular hypertrophy. The interventricular septum is flattened in systole and diastole, consistent with right ventricular pressure and volume overload. Left ventricular diastolic parameters were normal. Right Ventricle: The right ventricular size is severely enlarged. No increase in right ventricular wall thickness. Right ventricular systolic function is normal. There  is normal pulmonary artery systolic pressure. The tricuspid regurgitant velocity is 2.44 m/s, and with an assumed right atrial pressure of 3 mmHg, the estimated right ventricular systolic pressure is 26.8 mmHg. Left Atrium: Left atrial size was normal in size. Right Atrium: Right atrial size was normal in size. Pericardium: There is no evidence of pericardial effusion. Mitral Valve: The mitral valve is normal in structure. No evidence of mitral valve regurgitation. No evidence of mitral valve stenosis. Tricuspid Valve: The tricuspid valve is normal in structure. Tricuspid valve regurgitation is mild . No evidence of tricuspid stenosis. Aortic Valve: The aortic valve was not well visualized. Aortic valve regurgitation is not visualized. No aortic stenosis is present. Aortic valve mean gradient measures 3.0 mmHg. Aortic valve peak gradient measures 5.4 mmHg. Aortic valve area, by VTI measures 2.20 cm. Pulmonic Valve: The pulmonic valve was normal in structure. Pulmonic valve regurgitation is not visualized. No evidence of pulmonic stenosis. Aorta: The aortic root and ascending aorta are structurally normal, with no evidence of dilitation. Venous: The inferior vena cava is normal in size with greater than 50% respiratory variability, suggesting right atrial pressure of 3 mmHg. IAS/Shunts: The atrial septum is grossly normal.  LEFT VENTRICLE PLAX 2D LVIDd:         2.80 cm   Diastology LVIDs:         1.80 cm   LV e' medial:    7.72 cm/s LV PW:         0.90 cm   LV E/e' medial:  11.4 LV IVS:        0.90 cm   LV e' lateral:   10.20 cm/s LVOT diam:     2.00 cm   LV E/e' lateral: 8.6 LV SV:         43 LV SV Index:   31 LVOT Area:     3.14 cm  RIGHT VENTRICLE RV Basal diam:  3.50 cm RV S prime:     15.80 cm/s TAPSE (M-mode): 2.8 cm LEFT ATRIUM             Index        RIGHT ATRIUM  Index LA diam:        2.50 cm 1.81 cm/m   RA Area:     9.50 cm LA Vol (A2C):   15.4 ml 11.12 ml/m  RA Volume:   18.10 ml 13.07 ml/m  LA Vol (A4C):   8.1 ml  5.86 ml/m LA Biplane Vol: 11.4 ml 8.23 ml/m  AORTIC VALVE AV Area (Vmax):    2.42 cm AV Area (Vmean):   2.23 cm AV Area (VTI):     2.20 cm AV Vmax:           116.00 cm/s AV Vmean:          79.000 cm/s AV VTI:            0.197 m AV Peak Grad:      5.4 mmHg AV Mean Grad:      3.0 mmHg LVOT Vmax:         89.40 cm/s LVOT Vmean:        56.000 cm/s LVOT VTI:          0.138 m LVOT/AV VTI ratio: 0.70  AORTA Ao Root diam: 2.90 cm Ao Asc diam:  2.30 cm MITRAL VALVE               TRICUSPID VALVE MV Area (PHT): 3.58 cm    TR Peak grad:   23.8 mmHg MV Decel Time: 212 msec    TR Vmax:        244.00 cm/s MV E velocity: 87.80 cm/s MV A velocity: 66.80 cm/s  SHUNTS MV E/A ratio:  1.31        Systemic VTI:  0.14 m                            Systemic Diam: 2.00 cm Stanly Leavens MD Electronically signed by Stanly Leavens MD Signature Date/Time: 11/09/2023/1:24:20 PM    Final    CT Angio Chest PE W and/or Wo Contrast Result Date: 11/08/2023 CLINICAL DATA:  Syncope versus seizure. EXAM: CT ANGIOGRAPHY CHEST WITH CONTRAST TECHNIQUE: Multidetector CT imaging of the chest was performed using the standard protocol during bolus administration of intravenous contrast. Multiplanar CT image reconstructions and MIPs were obtained to evaluate the vascular anatomy. RADIATION DOSE REDUCTION: This exam was performed according to the departmental dose-optimization program which includes automated exposure control, adjustment of the mA and/or kV according to patient size and/or use of iterative reconstruction technique. CONTRAST:  75mL OMNIPAQUE  IOHEXOL  350 MG/ML SOLN COMPARISON:  None Available. FINDINGS: Cardiovascular: There is marked severity calcification of the aortic arch, without evidence of aortic aneurysm. Satisfactory opacification of the pulmonary arteries to the segmental level. No evidence of pulmonary embolism. Normal heart size. No pericardial effusion. Mediastinum/Nodes: No enlarged  mediastinal, hilar, or axillary lymph nodes. Thyroid gland, trachea, and esophagus demonstrate no significant findings. Lungs/Pleura: The lungs are hyperinflated with moderate severity emphysematous lung disease. Mild linear scarring and/or atelectasis is seen within the anterior lateral aspect of the right lung base. There is no evidence of an acute infiltrate, pleural effusion or pneumothorax. Upper Abdomen: No acute abnormality. Musculoskeletal: Mild chronic wedging of the T12 vertebral body is seen. Multilevel degenerative changes are noted throughout the thoracic spine. Review of the MIP images confirms the above findings. IMPRESSION: 1. No evidence of pulmonary embolism or other acute intrathoracic process. 2. Moderate severity emphysematous lung disease. 3. Mild linear scarring and/or atelectasis within the anterior lateral aspect of the right lung base. 4.  Mild chronic wedging of the T12 vertebral body. 5. Aortic atherosclerosis. Aortic Atherosclerosis (ICD10-I70.0) and Emphysema (ICD10-J43.9). Electronically Signed   By: Suzen Dials M.D.   On: 11/08/2023 20:53   CT Head Wo Contrast Result Date: 11/08/2023 CLINICAL DATA:  Seizure EXAM: CT HEAD WITHOUT CONTRAST TECHNIQUE: Contiguous axial images were obtained from the base of the skull through the vertex without intravenous contrast. RADIATION DOSE REDUCTION: This exam was performed according to the departmental dose-optimization program which includes automated exposure control, adjustment of the mA and/or kV according to patient size and/or use of iterative reconstruction technique. COMPARISON:  None Available. FINDINGS: Brain: There is no mass, hemorrhage or extra-axial collection. There is generalized atrophy without lobar predilection. Hypodensity of the white matter is most commonly associated with chronic microvascular disease. Vascular: Atherosclerotic calcification of the internal carotid arteries at the skull base. No abnormal hyperdensity  of the major intracranial arteries or dural venous sinuses. Skull: The visualized skull base, calvarium and extracranial soft tissues are normal. Sinuses/Orbits: No fluid levels or advanced mucosal thickening of the visualized paranasal sinuses. No mastoid or middle ear effusion. Normal orbits. Other: None. IMPRESSION: 1. No acute intracranial abnormality. 2. Generalized atrophy and findings of chronic microvascular disease. Electronically Signed   By: Franky Stanford M.D.   On: 11/08/2023 20:41   DG Chest 2 View Result Date: 11/08/2023 CLINICAL DATA:  Syncope.  Loss of consciousness. EXAM: CHEST - 2 VIEW COMPARISON:  06/20/2022. FINDINGS: Redemonstration of a well-circumscribed nodule seen overlying the upper lobes on the lateral film measuring 10 x 13 mm. The nodule is not distinctly seen on the PA projection. When compared to the prior exam, the nodule appears increased in size. Further evaluation with chest CT scan is again recommended. Bilateral lungs appear hyperexpanded and hyperlucent with coarse bronchovascular markings, in keeping with COPD. Bilateral lungs otherwise appear clear. No dense consolidation or lung collapse. Bilateral costophrenic angles are clear. Stable cardio-mediastinal silhouette. No acute osseous abnormalities. The soft tissues are within normal limits. IMPRESSION: 1. Redemonstration of a well-circumscribed nodule overlying the upper lobes on the lateral film measuring 1.0 x 1.3 cm, increased in size since the prior study. Further evaluation with chest CT scan is again recommended. 2. COPD. 3. No acute cardiopulmonary abnormality. Electronically Signed   By: Ree Molt M.D.   On: 11/08/2023 16:48    Microbiology: Results for orders placed or performed during the hospital encounter of 06/24/22  Resp Panel by RT-PCR (Flu A&B, Covid) Anterior Nasal Swab     Status: None   Collection Time: 06/24/22  8:12 PM   Specimen: Anterior Nasal Swab  Result Value Ref Range Status   SARS  Coronavirus 2 by RT PCR NEGATIVE NEGATIVE Final    Comment: (NOTE) SARS-CoV-2 target nucleic acids are NOT DETECTED.  The SARS-CoV-2 RNA is generally detectable in upper respiratory specimens during the acute phase of infection. The lowest concentration of SARS-CoV-2 viral copies this assay can detect is 138 copies/mL. A negative result does not preclude SARS-Cov-2 infection and should not be used as the sole basis for treatment or other patient management decisions. A negative result may occur with  improper specimen collection/handling, submission of specimen other than nasopharyngeal swab, presence of viral mutation(s) within the areas targeted by this assay, and inadequate number of viral copies(<138 copies/mL). A negative result must be combined with clinical observations, patient history, and epidemiological information. The expected result is Negative.  Fact Sheet for Patients:  bloggercourse.com  Fact Sheet for Healthcare Providers:  seriousbroker.it  This test is no t yet approved or cleared by the [place]  FDA and  has been authorized for detection and/or diagnosis of SARS-CoV-2 by FDA under an Emergency Use Authorization (EUA). This EUA will remain  in effect (meaning this test can be used) for the duration of the COVID-19 declaration under Section 564(b)(1) of the Act, 21 U.S.C.section 360bbb-3(b)(1), unless the authorization is terminated  or revoked sooner.       Influenza A by PCR NEGATIVE NEGATIVE Final   Influenza B by PCR NEGATIVE NEGATIVE Final    Comment: (NOTE) The Xpert Xpress SARS-CoV-2/FLU/RSV plus assay is intended as an aid in the diagnosis of influenza from Nasopharyngeal swab specimens and should not be used as a sole basis for treatment. Nasal washings and aspirates are unacceptable for Xpert Xpress SARS-CoV-2/FLU/RSV testing.  Fact Sheet for  Patients: bloggercourse.com  Fact Sheet for Healthcare Providers: seriousbroker.it  This test is not yet approved or cleared by the [place]  FDA and has been authorized for detection and/or diagnosis of SARS-CoV-2 by FDA under an Emergency Use Authorization (EUA). This EUA will remain in effect (meaning this test can be used) for the duration of the COVID-19 declaration under Section 564(b)(1) of the Act, 21 U.S.C. section 360bbb-3(b)(1), unless the authorization is terminated or revoked.  Performed at [facility], [address] [place], [place] [address]     Labs: CBC: Recent Labs  Lab 11/08/23 1417 11/09/23 0500  WBC 15.9* 10.1  NEUTROABS 13.2* 6.7  HGB 14.5 12.4  HCT 44.8 38.7  MCV 93.7 95.6  PLT 189 163   Basic Metabolic Panel: Recent Labs  Lab 11/08/23 1417 11/08/23 2206 11/09/23 0500  NA 135  --  138  K 3.7  --  4.4  CL 100  --  109  CO2 25  --  25  GLUCOSE 156*  --  77  BUN 10  --  6*  CREATININE 0.67  --  0.46  CALCIUM  8.7*  --  8.5*  MG  --  2.1 2.1  PHOS  --   --  2.7   Liver Function Tests: Recent Labs  Lab 11/09/23 0500  AST 14*  ALT 13  ALKPHOS 65  BILITOT 0.7  PROT 5.8*  ALBUMIN 3.2*   CBG: Recent Labs  Lab 11/08/23 1415  GLUCAP 135*    Discharge time spent: greater than 30 minutes.  Signed: Delon Herald, MD [facility] 11/09/2023

## 2023-11-09 NOTE — Progress Notes (Signed)
 Echocardiogram 2D Echocardiogram has been performed.  Melanie Caldwell Hollyanne Schloesser 11/09/2023, 10:43 AM

## 2023-11-11 ENCOUNTER — Other Ambulatory Visit (HOSPITAL_COMMUNITY): Payer: Self-pay

## 2024-05-09 ENCOUNTER — Other Ambulatory Visit (HOSPITAL_BASED_OUTPATIENT_CLINIC_OR_DEPARTMENT_OTHER): Payer: Self-pay

## 2024-05-09 ENCOUNTER — Encounter (HOSPITAL_BASED_OUTPATIENT_CLINIC_OR_DEPARTMENT_OTHER): Payer: Self-pay | Admitting: Family Medicine

## 2024-05-09 ENCOUNTER — Ambulatory Visit (INDEPENDENT_AMBULATORY_CARE_PROVIDER_SITE_OTHER): Admitting: Family Medicine

## 2024-05-09 VITALS — BP 127/84 | HR 100 | Ht <= 58 in | Wt 77.0 lb

## 2024-05-09 DIAGNOSIS — R634 Abnormal weight loss: Secondary | ICD-10-CM | POA: Insufficient documentation

## 2024-05-09 DIAGNOSIS — F411 Generalized anxiety disorder: Secondary | ICD-10-CM | POA: Insufficient documentation

## 2024-05-09 DIAGNOSIS — Z789 Other specified health status: Secondary | ICD-10-CM | POA: Diagnosis not present

## 2024-05-09 DIAGNOSIS — Z8709 Personal history of other diseases of the respiratory system: Secondary | ICD-10-CM

## 2024-05-09 DIAGNOSIS — F330 Major depressive disorder, recurrent, mild: Secondary | ICD-10-CM

## 2024-05-09 DIAGNOSIS — Z13820 Encounter for screening for osteoporosis: Secondary | ICD-10-CM

## 2024-05-09 DIAGNOSIS — R413 Other amnesia: Secondary | ICD-10-CM | POA: Insufficient documentation

## 2024-05-09 DIAGNOSIS — Z1231 Encounter for screening mammogram for malignant neoplasm of breast: Secondary | ICD-10-CM

## 2024-05-09 DIAGNOSIS — R9431 Abnormal electrocardiogram [ECG] [EKG]: Secondary | ICD-10-CM

## 2024-05-09 DIAGNOSIS — Z7409 Other reduced mobility: Secondary | ICD-10-CM

## 2024-05-09 DIAGNOSIS — Z780 Asymptomatic menopausal state: Secondary | ICD-10-CM

## 2024-05-09 DIAGNOSIS — Z1211 Encounter for screening for malignant neoplasm of colon: Secondary | ICD-10-CM

## 2024-05-09 HISTORY — DX: Abnormal weight loss: R63.4

## 2024-05-09 LAB — POCT URINALYSIS DIP (CLINITEK)
Bilirubin, UA: NEGATIVE
Blood, UA: NEGATIVE
Glucose, UA: NEGATIVE mg/dL
Leukocytes, UA: NEGATIVE
Nitrite, UA: NEGATIVE
POC PROTEIN,UA: NEGATIVE
Spec Grav, UA: 1.025 (ref 1.010–1.025)
Urobilinogen, UA: 0.2 U/dL
pH, UA: 6 (ref 5.0–8.0)

## 2024-05-09 MED ORDER — ALBUTEROL SULFATE (2.5 MG/3ML) 0.083% IN NEBU
2.5000 mg | INHALATION_SOLUTION | Freq: Four times a day (QID) | RESPIRATORY_TRACT | 12 refills | Status: DC | PRN
  Filled 2024-05-09: qty 75, 7d supply, fill #0

## 2024-05-09 MED ORDER — TRAZODONE HCL 50 MG PO TABS
25.0000 mg | ORAL_TABLET | Freq: Every day | ORAL | 1 refills | Status: DC
  Filled 2024-05-09: qty 50, 100d supply, fill #0
  Filled 2024-08-10: qty 50, 100d supply, fill #1

## 2024-05-09 MED ORDER — CITALOPRAM HYDROBROMIDE 20 MG PO TABS
20.0000 mg | ORAL_TABLET | Freq: Every day | ORAL | 2 refills | Status: DC
  Filled 2024-05-09: qty 90, 90d supply, fill #0
  Filled 2024-08-10: qty 90, 90d supply, fill #1

## 2024-05-09 MED ORDER — HYDROXYZINE HCL 25 MG PO TABS
25.0000 mg | ORAL_TABLET | Freq: Three times a day (TID) | ORAL | 2 refills | Status: AC | PRN
  Filled 2024-05-09: qty 90, 30d supply, fill #0
  Filled 2024-08-10: qty 90, 30d supply, fill #1

## 2024-05-09 NOTE — Progress Notes (Addendum)
 New Patient Office Visit  Subjective:   Melanie Caldwell 1956-06-20 05/09/2024  Chief Complaint  Patient presents with   New Patient (Initial Visit)    Patient is here today to get established with the practice. There are some concerns about memory loss, and some problems with depression. Pt's friend is also requesting to have some caregivers come out to help pt.    HPI: Melanie Caldwell presents today to establish care at [facility] at [facility]. Introduced to Publishing rights manager role and practice setting.  All questions answered.   Last PCP: Melanie Ore, MD [facility])  Last annual physical: Over 3 years ago per friend  Concerns: See below    ADL Concern:  Patient accompanied by friend and POA Erica to appointment. She lives independent retirement center at [facility]. She states she is eating 3 meals/day. Her friend is concerned with memory loss and difficulty with medication management, showering, toileting, and maintaining a clean environment. Patient states her memory is off and on. Friend states her stamina and ability to care for herself ADLs (hygiene, cleaning room, showering, dressing) is starting to deteriorate. She does have a dog and friend states there have been concerns regarding lack of cleaning up after the dog within the facility.. She does not have a family member that helps her and does not have children. Melanie Caldwell, patient's friend, has received phone calls from facility regarding odor in the room and lack of cleaning. Her friend does help with her pill box and medication management weekly.MMSE 25/30.   WEIGHT LOSS- Unintentional   Patient has an chronic unintentional weight loss in the past 6 months from January 2025.  She denies any change in oral intake, abdominal pain, bowel movements, fevers or chills.  She states she lives in an independent living center currently, but is having difficulty with ADLs and  may need to progress to skilled nursing and/or have caregiver daily.  She does report eating 3 meals per day and snacks intermittently.  She has a history of chronic tobacco use.  She reports a colonoscopy several years ago, able to find one from 2012 per PCP.  Will obtain most recent PCP records.  With her colonoscopy in 2012 there was a hyperplastic polyp present.  She denies any blood in her stool, urinary changes.  Declines stool test today.  Duration: months Amount of weight loss: Over 30 lbs since January 2025  Fevers: no Decreased appetite: no Night sweats: no Dysphagia/odynophagia: no Dental abnormalities: no Chest pain: no Shortness of breath: Chronic due to COPD; patient denies worsening  Cough: no Nausea: no Vomiting: no Diarrhea: no Abdominal pain: no Blood in stool: no Easy bruising/bleeding: no Jaundice: no Polydipsia/polyuria: no Recent illness: no Depression: Yes, currently well controlled with medication  Previous colonoscopy: Yes, unable to recall, will get records w/ previous PCP   Alcohol Use: Very rare- social at holidays   Breakfast: Eggs, Hashbrowns, Meat, Toast Lunch: Whatever is served at facility- she reports eating a whole plate Dinner: Served at facility- eats whole plate.  Snack: Infrequent, at night if needed.   Wt Readings from Last 3 Encounters:  05/09/24 77 lb (34.9 kg)  11/08/23 105 lb (47.6 kg)  06/24/22 80 lb (36.3 kg)     ANXIETY and depression: [name] presents for the medical management of anxiety.  Current medication regimen: Citalopram  20 mg, hydroxyzine  25 mg once daily, trazodone  25 mg at nighttime for insomnia and  depression Well controlled: Patient reports generally anxiety and depression are well-controlled.  She states she does not do well with change or new or sudden appointments.  Reports her regimen has kept her well-controlled.  Last EKG did show prolonged QT interval.  This is possibly due to her Celexa .  Recommend  repeating EKG today. Denies SI/HI.     05/09/2024    1:14 PM  GAD 7 : Generalized Anxiety Score  Nervous, Anxious, on Edge 1  Control/stop worrying 0  Worry too much - different things 2  Trouble relaxing 0  Restless 0  Easily annoyed or irritable 0  Afraid - awful might happen 0  Total GAD 7 Score 3  Anxiety Difficulty Not difficult at all      05/09/2024    1:12 PM  PHQ9 SCORE ONLY  PHQ-9 Total Score 5    The following portions of the patient's history were reviewed and updated as appropriate: past medical history, past surgical history, family history, social history, allergies, medications, and problem list.   Patient Active Problem List   Diagnosis Date Noted   Short-term memory loss 05/09/2024   Weight loss, unintentional 05/09/2024   GAD (generalized anxiety disorder) 05/09/2024   Impaired mobility and ADLs 05/09/2024   Leukocytosis 11/09/2023   Prolonged QT interval 11/09/2023   History of COPD 11/09/2023   Depression 11/09/2023   Syncope 11/08/2023   Adjustment disorder with depressed mood 04/19/2018   GERD 10/16/2010   DIARRHEA 02/12/2009   Asthma 10/01/2008   LEG PAIN, BILATERAL 10/01/2008   TOBACCO ABUSE 08/16/2008   SHORTNESS OF BREATH 08/16/2008   CHEST PAIN, ATYPICAL 08/16/2008   FIBROIDS, UTERUS 07/12/2008   Constipation 07/12/2008   Abdominal pain, other specified site 07/06/2008   Past Medical History:  Diagnosis Date   Abdominal pain, epigastric 10/16/2010   Qualifier: Diagnosis of   By: Earlean CMA (AAMA), Alan         ADD (attention deficit disorder)    Anxiety disorder    Asthma    Bulging lumbar disc    CHEST PAIN UNSPECIFIED 10/16/2010   Qualifier: Diagnosis of   By: Earlean CMA (AAMA), Amanda         COPD (chronic obstructive pulmonary disease) (HCC)    DDD (degenerative disc disease), lumbar    Depression    Fibroid uterus    Fracture of metacarpal bone 03/14/2020   Gastric ulcer    GERD (gastroesophageal reflux disease)     History of nephrolithiasis    History of UTI    HLD (hyperlipidemia)    NAUSEA AND VOMITING 10/16/2010   Qualifier: Diagnosis of   By: Aneita MD NOLIA Gist T        Pyloric stenosis    Recreational drug use    History   RHINOSINUSITIS, ACUTE 07/25/2008   Qualifier: Diagnosis of   By: Georgian ROSALEA CHARM Lamar        SIRS (systemic inflammatory response syndrome) (HCC) 11/09/2023   Tobacco abuse    Past Surgical History:  Procedure Laterality Date   ESOPHAGEAL DILATION     EXPLORATION MIDDLE EAR     with revision of left stapedectomy and replacement of prosthesis   FETAL SURGERY FOR CONGENITAL HERNIA     HERNIA REPAIR     LITHOTRIPSY  1985   TONSILLECTOMY     Family History  Problem Relation Age of Onset   Coronary artery disease Mother    Hypertension Mother    Diabetes Father  Colon cancer Maternal Grandfather    Social History   Socioeconomic History   Marital status: Single    Spouse name: Not on file   Number of children: 0   Years of education: Not on file   Highest education level: Not on file  Occupational History    Employer: UNEMPLOYED  Tobacco Use   Smoking status: Every Day    Current packs/day: 0.50    Types: Cigarettes   Smokeless tobacco: Never   Tobacco comments:    Patient given counseling sheet to quit smoking; now smoking :hookah with 1 cigarette daily....  Substance and Sexual Activity   Alcohol use: Yes    Comment: hx of heavy Etoh use in the past; now socially   Drug use: No   Sexual activity: Not on file  Other Topics Concern   Not on file  Social History Narrative   Not on file   Social Drivers of Health   Financial Resource Strain: Not on file  Food Insecurity: Not on file  Transportation Needs: Not on file  Physical Activity: Not on file  Stress: Not on file  Social Connections: Not on file  Intimate Partner Violence: Not on file   Outpatient Medications Prior to Visit  Medication Sig Dispense Refill   ADVIL  200 MG tablet Take  200-600 mg by mouth every 6 (six) hours as needed for mild pain or headache.     albuterol  (PROVENTIL ) (2.5 MG/3ML) 0.083% nebulizer solution Take 3 mLs (2.5 mg total) by nebulization every 2 (two) hours as needed for wheezing or shortness of breath. 75 mL 12   citalopram  (CELEXA ) 20 MG tablet Take 1 tablet (20 mg total) by mouth daily. 30 tablet 1   hydrOXYzine  (ATARAX ) 25 MG tablet Take 1 tablet (25 mg total) by mouth at bedtime as needed for anxiety. 30 tablet 1   traZODone  (DESYREL ) 50 MG tablet Take 0.5 tablets (25 mg total) by mouth at bedtime. 15 tablet 1   No facility-administered medications prior to visit.   Allergies  Allergen Reactions   Sulfamethoxazole -Trimethoprim  Rash   Adhesive [Tape] Other (See Comments)    Blisters with certain tapes   Ceftin [Cefuroxime Axetil] Hives   Codeine Nausea And Vomiting    ROS: A complete ROS was performed with pertinent positives/negatives noted in the HPI. The remainder of the ROS are negative.   Objective:   Today's Vitals   05/09/24 1302  BP: 127/84  Pulse: 100  SpO2: 96%  Weight: 77 lb (34.9 kg)  Height: 4' 10 (1.473 m)    GENERAL: Well-appearing, in NAD. Well nourished.  SKIN: Pink, warm and dry. No rash, lesion, ulceration, or ecchymoses.  Head: Normocephalic. NECK: Trachea midline. Full ROM w/o pain or tenderness. No lymphadenopathy.  EARS: Tympanic membranes are intact, translucent without bulging and without drainage. Appropriate landmarks visualized.  EYES: Conjunctiva clear without exudates. EOMI, PERRL, no drainage present.  NOSE: Septum midline w/o deformity. Nares patent, mucosa pink and non-inflamed w/o drainage. No sinus tenderness.  THROAT: Uvula midline. Oropharynx clear. Tonsils non-inflamed without exudate. Mucous membranes pink and moist.  RESPIRATORY: Chest wall symmetrical. Respirations even and non-labored. Breath sounds clear to auscultation bilaterally.  CARDIAC: S1, S2 present, regular rate and rhythm  without murmur or gallops. Peripheral pulses 2+ bilaterally.  MSK: Muscle tone and strength appropriate for age. Joints w/o tenderness, redness, or swelling.  EXTREMITIES: Without clubbing, cyanosis, or edema.  NEUROLOGIC: No motor or sensory deficits. Steady, even gait. C2-C12 intact.  PSYCH/MENTAL STATUS:  Alert, oriented x 3. Cooperative, appropriate mood and affect.   EKG: 05/09/2024  Vent. rate 95 BPM PR interval 138 ms QRS duration 82 ms QT/QTcB 352/442 ms P-R-T axes 86 218 73  Normal sinus rhythm Right atrial enlargement Right superior axis deviation Pulmonary disease pattern Abnormal ECG When compared with ECG of 09-Nov-2023 04:49, QT prolongation is no longer present.    Health Maintenance Due  Topic Date Due   Medicare Annual Wellness (AWV)  Never done   Hepatitis C Screening  Never done   DTaP/Tdap/Td (1 - Tdap) Never done   Colonoscopy  Never done   MAMMOGRAM  Never done   Zoster Vaccines- Shingrix (1 of 2) Never done   Pneumococcal Vaccine: 50+ Years (2 of 2 - PCV) 07/12/2009   DEXA SCAN  Never done   COVID-19 Vaccine (1 - 2024-25 season) Never done    Results for orders placed or performed in visit on 05/09/24  POCT URINALYSIS DIP (CLINITEK)  Result Value Ref Range   Color, UA yellow yellow   Clarity, UA clear clear   Glucose, UA negative negative mg/dL   Bilirubin, UA negative negative   Ketones, POC UA trace (5) (A) negative mg/dL   Spec Grav, UA 8.974 8.989 - 1.025   Blood, UA negative negative   pH, UA 6.0 5.0 - 8.0   POC PROTEIN,UA negative negative, trace   Urobilinogen, UA 0.2 0.2 or 1.0 E.U./dL   Nitrite, UA Negative Negative   Leukocytes, UA Negative Negative       Assessment & Plan:  1. Impaired mobility and ADLs (Primary) Discussed possible need for SNF versus independent living with a caregiver.  Given patient having difficulty with ADLs, will place referral to home health regarding possible caregivers to help with her ADLs and also  placed referral to social work for assistance.  Patient is interested in increasing exercise.  I recommend we establish care with a caregiver first, address weight loss concerns, prior to starting exercise program and patient verbalized understanding.  Referral is placed for patient to obtain further information.   - AMB Referral VBCI Care Management - Ambulatory referral to Home Health - Amb Referral To Provider Referral Exercise Program (P.R.E.P)  2. GAD (generalized anxiety disorder) Well-controlled currently.  EKG shows resolution of QT prolongation.  Will continue Celexa  and discussed safe use of Atarax  in older adult.  Patient declined discontinuation of medication at this time and states she will take 1 tablet only daily.  We discussed possible adverse effects and she verbalized understanding.  Patient has been tolerating for several years. - citalopram  (CELEXA ) 20 MG tablet; Take 1 tablet (20 mg total) by mouth daily.  Dispense: 90 tablet; Refill: 2 - hydrOXYzine  (ATARAX ) 25 MG tablet; Take 1 tablet (25 mg total) by mouth every 8 (eight) hours as needed for anxiety.  Dispense: 90 tablet; Refill: 2  3. Mild episode of recurrent major depressive disorder (HCC) Controlled.  Discussed safe use of medication and she verbalized understanding. - traZODone  (DESYREL ) 50 MG tablet; Take 0.5 tablets (25 mg total) by mouth at bedtime.  Dispense: 60 tablet; Refill: 1  4. Weight loss, unintentional Currently uncontrolled.  Recommend patient increase protein intake, will obtain lab work below for workup of unintentional weight loss.  Per chart review patient has fluctuated between 80 to 105 pounds.  Discussed risk with smoking history and recommend we obtain preventative screenings recommended.  Patient verbalized understanding.  Trace ketones present in urine today.  She declined stool test.  -  CBC with Differential/Platelet - Comprehensive metabolic panel with GFR - Lipid panel - Sedimentation rate -  Hemoglobin A1c - HIV Antibody (routine testing w rflx) - Hepatitis C antibody - TSH Rfx on Abnormal to Free T4 - POCT URINALYSIS DIP (CLINITEK)  5. Breast cancer screening by mammogram - MM 3D SCREENING MAMMOGRAM BILATERAL BREAST; Future  6. Encounter for osteoporosis screening in asymptomatic postmenopausal patient - DG Bone Density; Future  7. Screening for colon cancer Last colonoscopy available to PCP was in 2012.  Recommend patient complete given history of hyperplastic polyp and referral placed. - Ambulatory referral to Gastroenterology  8. History of COPD Consistent with EKG findings. She does use albuterol  PRN for exacerbations and flares. Denies recent flare. She is not O2 dependent. COPD does limit her ability to perform work around her house with cleaning, cooking, and significant expiratory effort in some activities. Denies need for daily ICS inhaler today though. Referral to [facility] in place to help with ADLs due to chronic conditions.    Patient to reach out to office if new, worrisome, or unresolved symptoms arise or if no improvement in patient's condition. Patient verbalized understanding and is agreeable to treatment plan. All questions answered to patient's satisfaction.   A total of 60 minutes were spent on this encounter today including face to face, ordering and reviewing labs, reviewing patient's previous records from ER Visits, Gastroenterology, documenting in the record and ordering  labs, medications and screenings.    Return in about 3 months (around 08/09/2024) for Follow up Unintentional Weight Loss, COPD.    Thersia Schuyler Stark, [place]

## 2024-05-10 LAB — CBC WITH DIFFERENTIAL/PLATELET
Basophils Absolute: 0.1 x10E3/uL (ref 0.0–0.2)
Basos: 1 %
EOS (ABSOLUTE): 0.1 x10E3/uL (ref 0.0–0.4)
Eos: 1 %
Hematocrit: 46.8 % — ABNORMAL HIGH (ref 34.0–46.6)
Hemoglobin: 15.5 g/dL (ref 11.1–15.9)
Immature Grans (Abs): 0 x10E3/uL (ref 0.0–0.1)
Immature Granulocytes: 0 %
Lymphocytes Absolute: 2.4 x10E3/uL (ref 0.7–3.1)
Lymphs: 23 %
MCH: 30.4 pg (ref 26.6–33.0)
MCHC: 33.1 g/dL (ref 31.5–35.7)
MCV: 92 fL (ref 79–97)
Monocytes Absolute: 0.6 x10E3/uL (ref 0.1–0.9)
Monocytes: 6 %
Neutrophils Absolute: 7 x10E3/uL (ref 1.4–7.0)
Neutrophils: 69 %
Platelets: 259 x10E3/uL (ref 150–450)
RBC: 5.1 x10E6/uL (ref 3.77–5.28)
RDW: 12.4 % (ref 11.7–15.4)
WBC: 10.2 x10E3/uL (ref 3.4–10.8)

## 2024-05-10 LAB — COMPREHENSIVE METABOLIC PANEL WITH GFR
ALT: 9 IU/L (ref 0–32)
AST: 21 IU/L (ref 0–40)
Albumin: 4.8 g/dL (ref 3.9–4.9)
Alkaline Phosphatase: 123 IU/L — ABNORMAL HIGH (ref 44–121)
BUN/Creatinine Ratio: 15 (ref 12–28)
BUN: 12 mg/dL (ref 8–27)
Bilirubin Total: 0.3 mg/dL (ref 0.0–1.2)
CO2: 20 mmol/L (ref 20–29)
Calcium: 10.3 mg/dL (ref 8.7–10.3)
Chloride: 98 mmol/L (ref 96–106)
Creatinine, Ser: 0.78 mg/dL (ref 0.57–1.00)
Globulin, Total: 2.7 g/dL (ref 1.5–4.5)
Glucose: 91 mg/dL (ref 70–99)
Potassium: 4.3 mmol/L (ref 3.5–5.2)
Sodium: 140 mmol/L (ref 134–144)
Total Protein: 7.5 g/dL (ref 6.0–8.5)
eGFR: 83 mL/min/1.73 (ref >59)

## 2024-05-10 LAB — LIPID PANEL
Chol/HDL Ratio: 3.6 ratio (ref 0.0–4.4)
Cholesterol, Total: 272 mg/dL — ABNORMAL HIGH (ref 100–199)
HDL: 76 mg/dL (ref >39)
LDL Chol Calc (NIH): 169 mg/dL — ABNORMAL HIGH (ref 0–99)
Triglycerides: 153 mg/dL — ABNORMAL HIGH (ref 0–149)
VLDL Cholesterol Cal: 27 mg/dL (ref 5–40)

## 2024-05-10 LAB — HEMOGLOBIN A1C
Est. average glucose Bld gHb Est-mCnc: 111 mg/dL
Hgb A1c MFr Bld: 5.5 % (ref 4.8–5.6)

## 2024-05-10 LAB — TSH RFX ON ABNORMAL TO FREE T4: TSH: 0.909 u[IU]/mL (ref 0.450–4.500)

## 2024-05-10 LAB — SEDIMENTATION RATE: Sed Rate: 14 mm/h (ref 0–40)

## 2024-05-10 LAB — HEPATITIS C ANTIBODY: Hep C Virus Ab: NONREACTIVE

## 2024-05-10 LAB — HIV ANTIBODY (ROUTINE TESTING W REFLEX): HIV Screen 4th Generation wRfx: NONREACTIVE

## 2024-05-10 NOTE — Addendum Note (Signed)
 Addended by: Kennie Karapetian on: 05/10/2024 01:12 PM   Modules accepted: Orders

## 2024-05-12 ENCOUNTER — Ambulatory Visit (HOSPITAL_BASED_OUTPATIENT_CLINIC_OR_DEPARTMENT_OTHER): Payer: Self-pay | Admitting: Family Medicine

## 2024-05-12 DIAGNOSIS — F17200 Nicotine dependence, unspecified, uncomplicated: Secondary | ICD-10-CM

## 2024-05-12 DIAGNOSIS — E782 Mixed hyperlipidemia: Secondary | ICD-10-CM | POA: Insufficient documentation

## 2024-05-12 NOTE — Progress Notes (Signed)
 Please let patient know the following.  CBC is unremarkable for signs of infection or anemia.  Her electrolytes, kidney function are stable.  Protein is stable.  Liver function is unremarkable.  Her cholesterol is significantly elevated.  This may be due to diet and tobacco use.  These levels do increase your risk for heart attack and stroke.  Following a heart healthy diet and smoking cessation may help to lower this.  There were no signs of inflammatory markers.  Her A1c was negative for diabetes.  Her HIV and hepatitis C labs were normal.  Her thyroid function is stable.  I would recommend she proceed with her further workup as discussed.  Regarding her cholesterol, this is at a level that I would recommend a statin therapy.  If she is agreeable to this I will send it in.

## 2024-05-15 ENCOUNTER — Other Ambulatory Visit (HOSPITAL_BASED_OUTPATIENT_CLINIC_OR_DEPARTMENT_OTHER): Payer: Self-pay | Admitting: Family Medicine

## 2024-05-15 DIAGNOSIS — E782 Mixed hyperlipidemia: Secondary | ICD-10-CM

## 2024-05-15 MED ORDER — ATORVASTATIN CALCIUM 20 MG PO TABS
20.0000 mg | ORAL_TABLET | Freq: Every day | ORAL | 5 refills | Status: DC
  Filled 2024-05-15: qty 30, 30d supply, fill #0
  Filled 2024-08-10: qty 30, 30d supply, fill #1

## 2024-05-16 ENCOUNTER — Other Ambulatory Visit (HOSPITAL_BASED_OUTPATIENT_CLINIC_OR_DEPARTMENT_OTHER): Payer: Self-pay

## 2024-05-18 ENCOUNTER — Other Ambulatory Visit (HOSPITAL_BASED_OUTPATIENT_CLINIC_OR_DEPARTMENT_OTHER): Payer: Self-pay

## 2024-06-05 ENCOUNTER — Encounter (HOSPITAL_BASED_OUTPATIENT_CLINIC_OR_DEPARTMENT_OTHER): Payer: Self-pay | Admitting: *Deleted

## 2024-06-05 ENCOUNTER — Telehealth: Payer: Self-pay

## 2024-06-05 NOTE — Progress Notes (Unsigned)
 Complex Care Management Note Care Guide Note  06/05/2024 Name: Melanie Caldwell MRN: [id number] DOB: Jul 02, 1956   Complex Care Management Outreach Attempts: An unsuccessful telephone outreach was attempted today to offer the patient information about available complex care management services.  Follow Up Plan:  Additional outreach attempts will be made to offer the patient complex care management information and services.   Encounter Outcome:  No Answer  Dreama [facility]  [facility], [facility] Health Care Management Assistant Direct Dial: [phone]  Fax: [phone]

## 2024-06-08 NOTE — Progress Notes (Unsigned)
 Complex Care Management Note Care Guide Note  06/08/2024 Name: Melanie Caldwell MRN: [id number] DOB: 09/15/1956   Complex Care Management Outreach Attempts: A second unsuccessful outreach was attempted today to offer the patient with information about available complex care management services.  Follow Up Plan:  Additional outreach attempts will be made to offer the patient complex care management information and services.   Encounter Outcome:  No Answer  Dreama [facility]wood [facility]  [facility], [facility] Health Care Management Assistant Direct Dial: [phone]  Fax: [phone]

## 2024-06-13 NOTE — Progress Notes (Signed)
 Complex Care Management Note  Care Guide Note 06/13/2024 Name: Melanie Caldwell MRN: [id number] DOB: [birth year]  Melanie Caldwell is a 68 y.o. year old female who sees Caudle, Thersia Bitters, FNP for primary care. I reached out to Verneita JINNY Solomons by phone today to offer complex care management services.  Melanie Caldwell was given information about Complex Care Management services today including:   The Complex Care Management services include support from the care team which includes your Nurse Care Manager, Clinical Social Worker, or Pharmacist.  The Complex Care Management team is here to help remove barriers to the health concerns and goals most important to you. Complex Care Management services are voluntary, and the patient may decline or stop services at any time by request to their care team member.   Complex Care Management Consent Status: Patient agreed to services and verbal consent obtained.   Follow up plan:  Telephone appointment with complex care management team member scheduled for:  06/21/24 at 3:00 p.m.  Encounter Outcome:  Patient Scheduled  Dreama [facility]  [facility], [facility] VBCI Assistant Direct Dial: [phone]  Fax: [phone]

## 2024-06-21 ENCOUNTER — Other Ambulatory Visit: Payer: Self-pay

## 2024-06-21 NOTE — Patient Outreach (Signed)
 LCSW called and spoke with patient's friend [name] and attempted to verify the designated release of party. It was explored if patient could get more assistance at current facility. It appears that current facility only has independent living. LCSW explored medicaid eligibility and it does not appear that patient would be eligible. [name] explained that PCS have been used in the past and are not affordable.  LCSW scheduled a follow up appointment for 07/05/2024 at 2:30 PM.  Olam Ally, MSW, LCSW [place]  [facility], [facility] Licensed Clinical Social Worker Direct Dial: [phone]

## 2024-07-05 ENCOUNTER — Telehealth: Payer: Self-pay

## 2024-07-05 NOTE — Patient Instructions (Signed)
 Verneita JINNY Solomons  and [name] Lia- I am sorry I was unable to reach you today for our scheduled appointment. I work with Knute, Thersia Bitters, FNP and am calling to support your healthcare needs. Please contact me at [phone] at your earliest convenience. I look forward to speaking with you soon.   Thank you,  Holley Cork RN, MSN, CCM Manager [facility], [place]  Direct Number: [phone]

## 2024-07-24 ENCOUNTER — Other Ambulatory Visit: Payer: Self-pay

## 2024-07-24 NOTE — Patient Outreach (Signed)
 LCSW followed up with patient's neighbor [name] in regards to the initial referral and [name] not being the designated party of release.LCSW informed [name] that LCSW's supervisor agreed that verbal permission from patient to enroll patient into VBCI services.  LCSW and [name] discussed the options again for further assistance with patient and it appears that [name] has knowledge of the options. LCSW inquired about LTC insurance as well as natural supports that could assist. It was decided that patient would not be enrolled into VBCI services at this time due to options being discussed and [name] does not want to upset patient.  Olam Ally, MSW, LCSW [place]  [facility], [facility] Licensed Clinical Social Worker Direct Dial: [phone]

## 2024-08-01 ENCOUNTER — Other Ambulatory Visit: Payer: Self-pay

## 2024-08-01 ENCOUNTER — Emergency Department (HOSPITAL_COMMUNITY)

## 2024-08-01 ENCOUNTER — Encounter (HOSPITAL_COMMUNITY): Payer: Self-pay

## 2024-08-01 ENCOUNTER — Observation Stay (HOSPITAL_COMMUNITY)
Admission: EM | Admit: 2024-08-01 | Discharge: 2024-08-03 | Disposition: A | Discharge status: Home / Self Care | Attending: Family Medicine | Admitting: Family Medicine

## 2024-08-01 DIAGNOSIS — J439 Emphysema, unspecified: Secondary | ICD-10-CM | POA: Diagnosis not present

## 2024-08-01 DIAGNOSIS — Z79899 Other long term (current) drug therapy: Secondary | ICD-10-CM | POA: Diagnosis not present

## 2024-08-01 DIAGNOSIS — F17210 Nicotine dependence, cigarettes, uncomplicated: Secondary | ICD-10-CM | POA: Insufficient documentation

## 2024-08-01 DIAGNOSIS — I700 Atherosclerosis of aorta: Secondary | ICD-10-CM | POA: Insufficient documentation

## 2024-08-01 DIAGNOSIS — J9611 Chronic respiratory failure with hypoxia: Secondary | ICD-10-CM | POA: Diagnosis not present

## 2024-08-01 DIAGNOSIS — R55 Syncope and collapse: Principal | ICD-10-CM | POA: Insufficient documentation

## 2024-08-01 DIAGNOSIS — J449 Chronic obstructive pulmonary disease, unspecified: Secondary | ICD-10-CM | POA: Insufficient documentation

## 2024-08-01 DIAGNOSIS — I517 Cardiomegaly: Secondary | ICD-10-CM | POA: Insufficient documentation

## 2024-08-01 DIAGNOSIS — F17220 Nicotine dependence, chewing tobacco, uncomplicated: Secondary | ICD-10-CM | POA: Insufficient documentation

## 2024-08-01 DIAGNOSIS — R636 Underweight: Secondary | ICD-10-CM | POA: Diagnosis not present

## 2024-08-01 DIAGNOSIS — Z681 Body mass index (BMI) 19 or less, adult: Secondary | ICD-10-CM | POA: Diagnosis not present

## 2024-08-01 LAB — URINALYSIS, ROUTINE W REFLEX MICROSCOPIC
Bilirubin Urine: NEGATIVE
Glucose, UA: NEGATIVE mg/dL
Hgb urine dipstick: NEGATIVE
Ketones, ur: NEGATIVE mg/dL
Leukocytes,Ua: NEGATIVE
Nitrite: NEGATIVE
Protein, ur: NEGATIVE mg/dL
Specific Gravity, Urine: 1.006 (ref 1.005–1.030)
pH: 7 (ref 5.0–8.0)

## 2024-08-01 LAB — RESP PANEL BY RT-PCR (RSV, FLU A&B, COVID)  RVPGX2
Influenza A by PCR: NEGATIVE
Influenza B by PCR: NEGATIVE
Resp Syncytial Virus by PCR: NEGATIVE
SARS Coronavirus 2 by RT PCR: NEGATIVE

## 2024-08-01 LAB — COMPREHENSIVE METABOLIC PANEL WITH GFR
ALT: 9 U/L (ref 0–44)
AST: 22 U/L (ref 15–41)
Albumin: 4.1 g/dL (ref 3.5–5.0)
Alkaline Phosphatase: 83 U/L (ref 38–126)
Anion gap: 10 (ref 5–15)
BUN: 9 mg/dL (ref 8–23)
CO2: 23 mmol/L (ref 22–32)
Calcium: 8.4 mg/dL — ABNORMAL LOW (ref 8.9–10.3)
Chloride: 105 mmol/L (ref 98–111)
Creatinine, Ser: 0.6 mg/dL (ref 0.44–1.00)
GFR, Estimated: >60 mL/min (ref >60)
Glucose, Bld: 93 mg/dL (ref 70–99)
Potassium: 4 mmol/L (ref 3.5–5.1)
Sodium: 138 mmol/L (ref 135–145)
Total Bilirubin: 0.2 mg/dL (ref 0.0–1.2)
Total Protein: 6.6 g/dL (ref 6.5–8.1)

## 2024-08-01 LAB — CBC WITH DIFFERENTIAL/PLATELET
Abs Immature Granulocytes: 0.01 K/uL (ref 0.00–0.07)
Basophils Absolute: 0.1 K/uL (ref 0.0–0.1)
Basophils Relative: 1 %
Eosinophils Absolute: 0.1 K/uL (ref 0.0–0.5)
Eosinophils Relative: 2 %
HCT: 44.6 % (ref 36.0–46.0)
Hemoglobin: 13.9 g/dL (ref 12.0–15.0)
Immature Granulocytes: 0 %
Lymphocytes Relative: 38 %
Lymphs Abs: 2.5 K/uL (ref 0.7–4.0)
MCH: 29 pg (ref 26.0–34.0)
MCHC: 31.2 g/dL (ref 30.0–36.0)
MCV: 93.1 fL (ref 80.0–100.0)
Monocytes Absolute: 0.5 K/uL (ref 0.1–1.0)
Monocytes Relative: 7 %
Neutro Abs: 3.4 K/uL (ref 1.7–7.7)
Neutrophils Relative %: 52 %
Platelets: 197 K/uL (ref 150–400)
RBC: 4.79 MIL/uL (ref 3.87–5.11)
RDW: 12.5 % (ref 11.5–15.5)
WBC: 6.5 K/uL (ref 4.0–10.5)
nRBC: 0 % (ref 0.0–0.2)

## 2024-08-01 LAB — TROPONIN T, HIGH SENSITIVITY
Troponin T High Sensitivity: <15 ng/L (ref 0–19)
Troponin T High Sensitivity: <15 ng/L (ref 0–19)

## 2024-08-01 LAB — CBG MONITORING, ED: Glucose-Capillary: 102 mg/dL — ABNORMAL HIGH (ref 70–99)

## 2024-08-01 LAB — D-DIMER, QUANTITATIVE: D-Dimer, Quant: <0.27 ug{FEU}/mL (ref 0.00–0.50)

## 2024-08-01 MED ORDER — ACETAMINOPHEN 325 MG PO TABS
650.0000 mg | ORAL_TABLET | Freq: Four times a day (QID) | ORAL | Status: DC | PRN
  Administered 2024-08-02 (×2): 650 mg via ORAL
  Filled 2024-08-01 (×2): qty 2

## 2024-08-01 MED ORDER — LACTATED RINGERS IV SOLN: INTRAVENOUS | Status: DC

## 2024-08-01 MED ORDER — MELATONIN 5 MG PO TABS
5.0000 mg | ORAL_TABLET | Freq: Every evening | ORAL | Status: DC | PRN
Start: 2024-08-01 — End: 2024-08-03

## 2024-08-01 MED ORDER — POLYETHYLENE GLYCOL 3350 17 G PO PACK: 17.0000 g | PACK | Freq: Every day | ORAL | Status: DC | PRN

## 2024-08-01 MED ORDER — ENOXAPARIN SODIUM 40 MG/0.4ML IJ SOSY: 40.0000 mg | PREFILLED_SYRINGE | INTRAMUSCULAR | Status: DC

## 2024-08-01 MED ORDER — PROCHLORPERAZINE EDISYLATE 10 MG/2ML IJ SOLN
5.0000 mg | Freq: Four times a day (QID) | INTRAMUSCULAR | Status: DC | PRN
  Administered 2024-08-02: 5 mg via INTRAVENOUS
  Filled 2024-08-01: qty 2

## 2024-08-01 NOTE — ED Provider Notes (Signed)
 [place] [facility] AT [facility] Provider Note   CSN: [id number] Arrival date & time: 08/01/24  1849     Patient presents with: Loss of Consciousness   Melanie Caldwell is a 68 y.o. female.    Loss of Consciousness    Patient has a history of acid reflux asthma, COPD, degenerative disc disease systemic inflammatory response syndrome.  She presents to the ED after a syncopal episode.  Patient states she was at the dining hall when she suddenly had a syncopal episode.  Patient states she felt fine.  She did not have any warning.  Reportedly patient appeared clammy and pale.  EMS was called.  She was given 500 cc of fluid and Zofran  for nausea.  Patient denies any trouble with chest pain.  She is not having any shortness of breath although reportedly she had decreased O2 sat with exertion.  Patient denies any leg swelling.  No abdominal pain  Prior to Admission medications   Medication Sig Start Date End Date Taking? Authorizing Provider  ADVIL  200 MG tablet Take 200-600 mg by mouth every 6 (six) hours as needed for mild pain or headache.    [provider]  albuterol  (PROVENTIL ) (2.5 MG/3ML) 0.083% nebulizer solution Take 3 mLs (2.5 mg total) by nebulization every 6 (six) hours as needed for wheezing or shortness of breath. 05/09/24   Caudle, Thersia Bitters, FNP  atorvastatin  (LIPITOR) 20 MG tablet Take 1 tablet (20 mg total) by mouth at bedtime. 05/15/24   Caudle, Thersia Bitters, FNP  citalopram  (CELEXA ) 20 MG tablet Take 1 tablet (20 mg total) by mouth daily. 05/09/24   Caudle, Thersia Bitters, FNP  hydrOXYzine  (ATARAX ) 25 MG tablet Take 1 tablet (25 mg total) by mouth every 8 (eight) hours as needed for anxiety. 05/09/24   Caudle, Thersia Bitters, FNP  traZODone  (DESYREL ) 50 MG tablet Take 0.5 tablets (25 mg total) by mouth at bedtime. 05/09/24   Caudle, Thersia Bitters, FNP    Allergies: Sulfamethoxazole -trimethoprim , Adhesive [tape], Ceftin [cefuroxime axetil],  and Codeine    Review of Systems  Cardiovascular:  Positive for syncope.    Updated Vital Signs BP 117/76 (BP Location: Right Arm)   Pulse 69   Temp 97.8 F (36.6 C) (Oral)   Resp 17   SpO2 99%   Physical Exam Vitals and nursing note reviewed.  Constitutional:      Appearance: She is well-developed.     Comments: Underweight  HENT:     Head: Normocephalic and atraumatic.     Right Ear: External ear normal.     Left Ear: External ear normal.  Eyes:     General: No scleral icterus.       Right eye: No discharge.        Left eye: No discharge.     Conjunctiva/sclera: Conjunctivae normal.  Neck:     Trachea: No tracheal deviation.  Cardiovascular:     Rate and Rhythm: Normal rate and regular rhythm.  Pulmonary:     Effort: Pulmonary effort is normal. No respiratory distress.     Breath sounds: Normal breath sounds. No stridor. No wheezing or rales.  Abdominal:     General: Bowel sounds are normal. There is no distension.     Palpations: Abdomen is soft.     Tenderness: There is no abdominal tenderness. There is no guarding or rebound.  Musculoskeletal:        General: No tenderness or deformity.     Cervical back:  Neck supple.  Skin:    General: Skin is warm and dry.     Findings: No rash.  Neurological:     General: No focal deficit present.     Mental Status: She is alert.     Cranial Nerves: No cranial nerve deficit, dysarthria or facial asymmetry.     Sensory: No sensory deficit.     Motor: No abnormal muscle tone or seizure activity.     Coordination: Coordination normal.  Psychiatric:        Mood and Affect: Mood normal.     (all labs ordered are listed, but only abnormal results are displayed) Labs Reviewed  COMPREHENSIVE METABOLIC PANEL WITH GFR - Abnormal; Notable for the following components:      Result Value   Calcium  8.4 (*)    All other components within normal limits  URINALYSIS, ROUTINE W REFLEX MICROSCOPIC - Abnormal; Notable for the  following components:   Color, Urine STRAW (*)    All other components within normal limits  CBG MONITORING, ED - Abnormal; Notable for the following components:   Glucose-Capillary 102 (*)    All other components within normal limits  RESP PANEL BY RT-PCR (RSV, FLU A&B, COVID)  [id number]  CBC WITH DIFFERENTIAL/PLATELET  D-DIMER, QUANTITATIVE (NOT AT [facility])  CBC  CREATININE, SERUM  TROPONIN T, HIGH SENSITIVITY  TROPONIN T, HIGH SENSITIVITY    EKG: EKG Interpretation Date/Time:  Tuesday August 01 2024 19:14:12 EDT Ventricular Rate:  78 PR Interval:  157 QRS Duration:  97 QT Interval:  434 QTC Calculation: 492 R Axis:   226  Text Interpretation: Sinus rhythm Low voltage with right axis deviation Borderline prolonged QT interval Confirmed by Cottie Cough [phone]) on 08/01/2024 7:37:44 PM  Radiology: [facility] Chest 2 View Result Date: 08/01/2024 CLINICAL DATA:  syncope, hypoxia EXAM: DG CHEST 2V COMPARISON:  CT chest 11/08/2023, chest x-ray 11/08/2023 FINDINGS: The heart and mediastinal contours are unchanged. Atherosclerotic plaque. Hyperinflation of the lungs with flattening of the hemidiaphragms. No focal consolidation. No pulmonary edema. No pleural effusion. No pneumothorax. No acute osseous abnormality. IMPRESSION: No active cardiopulmonary disease. Aortic Atherosclerosis (ICD10-I70.0) and Emphysema (ICD10-J43.9). Electronically Signed   By: Morgane  Naveau M.D.   On: 08/01/2024 20:43     Procedures   Medications Ordered in the ED  enoxaparin (LOVENOX) injection 40 mg (has no administration in time range)    Clinical Course as of 08/01/24 2329  Tue Aug 01, 2024  2303 CBC WITH DIFFERENTIAL CBC normal.  Metabolic panel normal.  Urinalysis negative.  Troponin D-dimer normal [JK]  2303 DG Chest 2 View Chest x-ray without acute finding [JK]  2326 Case discussed with Dr. Shona regarding admission [JK]    Clinical Course User Index [JK] Randol Simmonds, MD                                  Medical Decision Making Problems Addressed: Syncope, unspecified syncope type: acute illness or injury that poses a threat to life or bodily functions  Amount and/or Complexity of Data Reviewed Labs: ordered. Decision-making details documented in ED Course. Radiology: independent interpretation performed. Decision-making details documented in ED Course.  Risk Decision regarding hospitalization.   Patient presented to the ED for evaluation of a syncopal episode.  No clear etiology for her symptoms at this time.  No signs of cardiac ischemia.  D-dimer negative already against pulmonary embolism.  Chest x-ray without signs of  pneumonia.  No signs of anemia or metabolic abnormality.  Patient would benefit from overnight observation.  Possible echocardiogram in the morning.  Discussed with Dr. Shona regarding admission     Final diagnoses:  Syncope, unspecified syncope type    ED Discharge Orders     None          Randol Simmonds, MD 08/01/24 2329

## 2024-08-01 NOTE — H&P (Signed)
 History and Physical  Melanie Caldwell FMW:[id number] DOB: [birth year] DOA: 08/01/2024  Referring physician: Dr. Randol, EDP  PCP: Knute Thersia Bitters, FNP  Outpatient Specialists: GI. Patient coming from: Retirement facility.  Chief Complaint: Passed out  HPI: Melanie Caldwell is a 68 y.o. female with medical history significant for prior syncopal event (11/08/2023), current tobacco user, less than 1 pack/day, COPD not on oxygen supplementation at baseline, who presented with syncope while sitting at dinner table tonight.  She does not recall the event that preceded the syncope.  States she was doing fine prior to that.  EMS was activated.  She received IV fluid and IV antiemetics en route.  Denies chest pain or shortness of breath.  ED Course: Temperature 98.  BP 96/64, pulse 73, respiratory 26, O2 saturation 99% on room air.  CBC with differential and CMP are all negative.   Review of Systems: Review of systems as noted in the HPI. All other systems reviewed and are negative.   Past Medical History:  Diagnosis Date   Abdominal pain, epigastric 10/16/2010   Qualifier: Diagnosis of   By: Lewellyn CMA (AAMA), Alan         ADD (attention deficit disorder)    Anxiety disorder    Asthma    Bulging lumbar disc    CHEST PAIN UNSPECIFIED 10/16/2010   Qualifier: Diagnosis of   By: Earlean CMA (AAMA), Alan         COPD (chronic obstructive pulmonary disease) (HCC)    DDD (degenerative disc disease), lumbar    Depression    Fibroid uterus    Fracture of metacarpal bone 03/14/2020   Gastric ulcer    GERD (gastroesophageal reflux disease)    History of nephrolithiasis    History of UTI    HLD (hyperlipidemia)    NAUSEA AND VOMITING 10/16/2010   Qualifier: Diagnosis of   By: Aneita MD NOLIA Gist T        Pyloric stenosis    Recreational drug use    History   RHINOSINUSITIS, ACUTE 07/25/2008   Qualifier: Diagnosis of   By: Georgian ROSALEA CHARM Lamar        SIRS (systemic inflammatory  response syndrome) (HCC) 11/09/2023   Tobacco abuse    Past Surgical History:  Procedure Laterality Date   ESOPHAGEAL DILATION     EXPLORATION MIDDLE EAR     with revision of left stapedectomy and replacement of prosthesis   FETAL SURGERY FOR CONGENITAL HERNIA     HERNIA REPAIR     LITHOTRIPSY  1985   TONSILLECTOMY      Social History:  reports that she has been smoking cigarettes. She started smoking about 47 years ago. She has a 23.9 pack-year smoking history. She has never used smokeless tobacco. She reports current alcohol use. She reports that she does not use drugs.   Allergies  Allergen Reactions   Sulfamethoxazole -Trimethoprim  Rash   Adhesive [Tape] Other (See Comments)    Blisters with certain tapes   Ceftin [Cefuroxime Axetil] Hives   Codeine Nausea And Vomiting    Family History  Problem Relation Age of Onset   Coronary artery disease Mother    Hypertension Mother    Diabetes Father    Colon cancer Maternal Grandfather       Prior to Admission medications   Medication Sig Start Date End Date Taking? Authorizing Provider  ADVIL  200 MG tablet Take 200-600 mg by mouth every 6 (six) hours as needed for mild pain or  headache.    [provider]  albuterol  (PROVENTIL ) (2.5 MG/3ML) 0.083% nebulizer solution Take 3 mLs (2.5 mg total) by nebulization every 6 (six) hours as needed for wheezing or shortness of breath. 05/09/24   Caudle, Thersia Bitters, FNP  atorvastatin  (LIPITOR) 20 MG tablet Take 1 tablet (20 mg total) by mouth at bedtime. 05/15/24   Caudle, Thersia Bitters, FNP  citalopram  (CELEXA ) 20 MG tablet Take 1 tablet (20 mg total) by mouth daily. 05/09/24   Caudle, Thersia Bitters, FNP  hydrOXYzine  (ATARAX ) 25 MG tablet Take 1 tablet (25 mg total) by mouth every 8 (eight) hours as needed for anxiety. 05/09/24   Caudle, Thersia Bitters, FNP  traZODone  (DESYREL ) 50 MG tablet Take 0.5 tablets (25 mg total) by mouth at bedtime. 05/09/24   Knute Thersia Bitters, FNP     Physical Exam: BP 117/76 (BP Location: Right Arm)   Pulse 69   Temp 97.8 F (36.6 C) (Oral)   Resp 17   SpO2 99%   General: 68 y.o. year-old female well developed well nourished in no acute distress.  Alert and oriented x3. Cardiovascular: Regular rate and rhythm with no rubs or gallops.  No thyromegaly or JVD noted.  No lower extremity edema. 2/4 pulses in all 4 extremities. Respiratory: Clear to auscultation with no wheezes or rales. Good inspiratory effort. Abdomen: Soft nontender nondistended with normal bowel sounds x4 quadrants. Muskuloskeletal: No cyanosis, clubbing or edema noted bilaterally Neuro: CN II-XII intact, strength, sensation, reflexes Skin: No ulcerative lesions noted or rashes Psychiatry: Judgement and insight appear normal. Mood is appropriate for condition and setting          Labs on Admission:  Basic Metabolic Panel: Recent Labs  Lab 08/01/24 1918  NA 138  K 4.0  CL 105  CO2 23  GLUCOSE 93  BUN 9  CREATININE 0.60  CALCIUM  8.4*   Liver Function Tests: Recent Labs  Lab 08/01/24 1918  AST 22  ALT 9  ALKPHOS 83  BILITOT 0.2  PROT 6.6  ALBUMIN 4.1   No results for input(s): LIPASE, AMYLASE in the last 168 hours. No results for input(s): AMMONIA in the last 168 hours. CBC: Recent Labs  Lab 08/01/24 1918  WBC 6.5  NEUTROABS 3.4  HGB 13.9  HCT 44.6  MCV 93.1  PLT 197   Cardiac Enzymes: No results for input(s): CKTOTAL, CKMB, CKMBINDEX, TROPONINI in the last 168 hours.  BNP (last 3 results) No results for input(s): BNP in the last 8760 hours.  ProBNP (last 3 results) No results for input(s): PROBNP in the last 8760 hours.  CBG: Recent Labs  Lab 08/01/24 1914  GLUCAP 102*    Radiological Exams on Admission: DG Chest 2 View Result Date: 08/01/2024 CLINICAL DATA:  syncope, hypoxia EXAM: DG CHEST 2V COMPARISON:  CT chest 11/08/2023, chest x-ray 11/08/2023 FINDINGS: The heart and mediastinal contours are  unchanged. Atherosclerotic plaque. Hyperinflation of the lungs with flattening of the hemidiaphragms. No focal consolidation. No pulmonary edema. No pleural effusion. No pneumothorax. No acute osseous abnormality. IMPRESSION: No active cardiopulmonary disease. Aortic Atherosclerosis (ICD10-I70.0) and Emphysema (ICD10-J43.9). Electronically Signed   By: Morgane  Naveau M.D.   On: 08/01/2024 20:43    EKG: I independently viewed the EKG done and my findings are as followed: Sinus rhythm rate of 72.  Nonspecific ST changes.  QTc 482.   Assessment/Plan Present on Admission:  Syncope  Principal Problem:   Syncope  Syncope, unclear etiology Obtain orthostatic vital signs every shift x  2 Continue IV fluid hydration PT OT evaluation Fall precautions Transthoracic echocardiogram  Current tobacco user Tobacco cessation done at bedside.  History of COPD, not on oxygen supplementation at baseline Maintain O2 saturation above 90% Bronchodilators nebulizers   Time: 75 minutes.   DVT prophylaxis: Subcu Lovenox daily.  Code Status: Full code.  Family Communication: None at bedside.  Disposition Plan: Admitted to telemetry unit  Consults called: None.  Admission status: Observation status.   Status is: Observation    Terry LOISE Hurst MD [facility] Pager [phone]  If 7PM-7AM, please contact night-coverage [email or web address] Password [email or web address]  08/01/2024, 11:30 PM

## 2024-08-01 NOTE — ED Provider Triage Note (Signed)
 Emergency Medicine Provider Triage Evaluation Note  Melanie Caldwell , a 68 y.o. female  was evaluated in triage.  Pt complains of syncope tonight.  Passed out in dining hall, clammy and pale.  Given 500 cc per EMS and 4 mg zofran .  81% on room air for EMS, stable on 2L [place].  Pt feeling much better, denies headache, chest pain, cough, SOB.  Mild nausea  Review of Systems  Positive: Nausea, syncope Negative: Chest pain, fever  Physical Exam  BP (!) 145/112   Pulse 75   Temp 98.3 F (36.8 C) (Oral)   Resp 20   SpO2 100%  Gen:   Awake, no distress   Resp:  Normal effort; 95% on 2L [place], no wheezing MSK:   Moves extremities without difficulty  Other:  Regular HR  Medical Decision Making  Medically screening exam initiated at 7:42 PM.  Appropriate orders placed.  Melanie Caldwell was informed that the remainder of the evaluation will be completed by another provider, this initial triage assessment does not replace that evaluation, and the importance of remaining in the ED until their evaluation is complete.  ECG, labs, dg chest ordered Stable on 2L [place], no chest pain or resp distress in triage   Melanie Donnice JINNY, MD 08/01/24 [phone]

## 2024-08-01 NOTE — ED Triage Notes (Addendum)
 Pt BIB EMS. Pt was eating and slumped over passing out at the dining hall, pt was clammy and pale. Pt given 500 ml left ac 20 g. 4 mg zofran  given due to nausea. Pt is from independent living, [place]. Pt placed on 2 L [place] in triage, O2 with exertion 81% room air.

## 2024-08-02 ENCOUNTER — Encounter: Payer: Self-pay | Admitting: *Deleted

## 2024-08-02 ENCOUNTER — Encounter (HOSPITAL_COMMUNITY): Payer: Self-pay | Admitting: Internal Medicine

## 2024-08-02 ENCOUNTER — Observation Stay (HOSPITAL_COMMUNITY)

## 2024-08-02 ENCOUNTER — Other Ambulatory Visit: Payer: Self-pay

## 2024-08-02 DIAGNOSIS — R55 Syncope and collapse: Secondary | ICD-10-CM

## 2024-08-02 DIAGNOSIS — J449 Chronic obstructive pulmonary disease, unspecified: Secondary | ICD-10-CM | POA: Insufficient documentation

## 2024-08-02 DIAGNOSIS — F17210 Nicotine dependence, cigarettes, uncomplicated: Secondary | ICD-10-CM | POA: Diagnosis not present

## 2024-08-02 DIAGNOSIS — J439 Emphysema, unspecified: Secondary | ICD-10-CM

## 2024-08-02 DIAGNOSIS — I700 Atherosclerosis of aorta: Secondary | ICD-10-CM | POA: Diagnosis not present

## 2024-08-02 LAB — BASIC METABOLIC PANEL WITH GFR
Anion gap: 12 (ref 5–15)
BUN: 8 mg/dL (ref 8–23)
CO2: 25 mmol/L (ref 22–32)
Calcium: 9.6 mg/dL (ref 8.9–10.3)
Chloride: 104 mmol/L (ref 98–111)
Creatinine, Ser: 0.63 mg/dL (ref 0.44–1.00)
GFR, Estimated: >60 mL/min (ref >60)
Glucose, Bld: 107 mg/dL — ABNORMAL HIGH (ref 70–99)
Potassium: 4.1 mmol/L (ref 3.5–5.1)
Sodium: 140 mmol/L (ref 135–145)

## 2024-08-02 LAB — ECHOCARDIOGRAM COMPLETE
Area-P 1/2: 5.37 cm2
S' Lateral: 2 cm

## 2024-08-02 LAB — CBC
HCT: 45.5 % (ref 36.0–46.0)
Hemoglobin: 14.3 g/dL (ref 12.0–15.0)
MCH: 28.8 pg (ref 26.0–34.0)
MCHC: 31.4 g/dL (ref 30.0–36.0)
MCV: 91.7 fL (ref 80.0–100.0)
Platelets: 216 K/uL (ref 150–400)
RBC: 4.96 MIL/uL (ref 3.87–5.11)
RDW: 12.4 % (ref 11.5–15.5)
WBC: 7.6 K/uL (ref 4.0–10.5)
nRBC: 0 % (ref 0.0–0.2)

## 2024-08-02 LAB — PHOSPHORUS: Phosphorus: 3 mg/dL (ref 2.5–4.6)

## 2024-08-02 LAB — MAGNESIUM: Magnesium: 2.6 mg/dL — ABNORMAL HIGH (ref 1.7–2.4)

## 2024-08-02 MED ORDER — LACTATED RINGERS IV BOLUS
1000.0000 mL | Freq: Once | INTRAVENOUS | Status: AC
  Administered 2024-08-02: 1000 mL via INTRAVENOUS

## 2024-08-02 MED ORDER — IPRATROPIUM-ALBUTEROL 0.5-2.5 (3) MG/3ML IN SOLN: 3.0000 mL | RESPIRATORY_TRACT | Status: DC | PRN

## 2024-08-02 MED ORDER — LORAZEPAM 0.5 MG PO TABS: 0.5000 mg | ORAL_TABLET | Freq: Three times a day (TID) | ORAL | Status: DC | PRN

## 2024-08-02 NOTE — Progress Notes (Signed)
 Patient enrolled for [employer] to ship a 30 day cardiac event monitor to her address on file.  Letter with instructions mailed to patient.

## 2024-08-02 NOTE — Progress Notes (Signed)
 SATURATION QUALIFICATIONS: (This note is used to comply with regulatory documentation for home oxygen)  Patient Saturations on Room Air at Rest = 95%  Patient Saturations on Room Air while Ambulating = 83%  Patient Saturations on 2 Liters of oxygen while Ambulating = 93%  Please briefly explain why patient needs home oxygen: to maintain appropriate SpO2 levels.   Sylvan Nest Kistler PT 08/02/2024  Acute Rehabilitation Services  Office [phone]

## 2024-08-02 NOTE — Evaluation (Signed)
 Occupational Therapy Evaluation Patient Details Name: Melanie Caldwell MRN: [id number] DOB: November 03, 1955 Today's Date: 08/02/2024   History of Present Illness   Pt is a 68 yo female admitted after syncopal episide at dinner table. PMH: syncope January 2025, anxiety, smoker, COPD.     Clinical Impressions Pt admitted with the above diagnosis and has the deficits outlined below. Pt would benefit from a short run of OT to achieve her baseline level of functioning of Mod I and introduce some energy conservation education. Pt states she lives in an senior living facility where meals are provided for but otherwise she does all adls and Iadls on her own. Pt appears mildly disheveled and unsure when last bath was taken. Pt has very poor memory but states she does drive but not often.  Unsure of how reliable pt is but do feel functionally she is probably close to her baseline.  Pt was found with O2 tubing in nose but O2 was not turned on and was 94% on RA.  When pt ambulated, pt immediately dropped to 80% on RA. Put pt on 2L when returned to room and pt was up to 95% within one minute.  Pt does NOT use O2 at her apartment.  BPs are below. Will continue to see pt acutely and recommend HHOT at her apartment at d/c.  Lying down BP  157/79 Sitting BP           122/80 Standing BP       116/82     If plan is discharge home, recommend the following:   Assistance with cooking/housework;Direct supervision/assist for medications management;Direct supervision/assist for financial management;Assist for transportation;Supervision due to cognitive status     Functional Status Assessment   Patient has had a recent decline in their functional status and demonstrates the ability to make significant improvements in function in a reasonable and predictable amount of time.     Equipment Recommendations   Tub/shower seat     Recommendations for Other Services         Precautions/Restrictions    Precautions Precautions: Fall Restrictions Weight Bearing Restrictions Per Provider Order: No     Mobility Bed Mobility Overal bed mobility: Modified Independent             General bed mobility comments: no assist needed    Transfers Overall transfer level: Needs assistance Equipment used: 1 person hand held assist Transfers: Sit to/from Stand Sit to Stand: Supervision           General transfer comment: no physical assist needed. supervision provided as pt has not been OOB      Balance Overall balance assessment: Mild deficits observed, not formally tested                                         ADL either performed or assessed with clinical judgement   ADL Overall ADL's : Needs assistance/impaired Eating/Feeding: Independent;Sitting   Grooming: Supervision/safety;Wash/dry hands;Wash/dry face;Oral care;Standing Grooming Details (indicate cue type and reason): stood for appx 3 minutes Upper Body Bathing: Set up;Sitting   Lower Body Bathing: Supervison/ safety;Sit to/from stand   Upper Body Dressing : Set up;Sitting   Lower Body Dressing: Supervision/safety;Sit to/from stand   Toilet Transfer: Contact guard assist;Ambulation;Comfort height toilet;Grab bars Toilet Transfer Details (indicate cue type and reason): Pt walked to bathroom in hallway Toileting- Clothing Manipulation and Hygiene: Contact guard assist;Sit  to/from stand       Functional mobility during ADLs: Supervision/safety General ADL Comments: Pt appears to be close to her baseline with adls and was overall supervision with basic adls. Pt O2 sats dropped with activity to 80% on RA. 2L O2 replaced and nursing was messaged.     Vision Baseline Vision/History: 1 Wears glasses Ability to See in Adequate Light: 0 Adequate Patient Visual Report: No change from baseline Vision Assessment?: No apparent visual deficits     Perception Perception: Not tested       Praxis Praxis:  Not tested       Pertinent Vitals/Pain Pain Assessment Pain Assessment: No/denies pain     Extremity/Trunk Assessment Upper Extremity Assessment Upper Extremity Assessment: Overall WFL for tasks assessed   Lower Extremity Assessment Lower Extremity Assessment: Defer to PT evaluation   Cervical / Trunk Assessment Cervical / Trunk Assessment: Normal   Communication Communication Communication: No apparent difficulties   Cognition Arousal: Alert Behavior During Therapy: Anxious Cognition: No family/caregiver present to determine baseline, Cognition impaired   Orientation impairments: Place, Time Awareness: Intellectual awareness impaired, Online awareness impaired Memory impairment (select all impairments): Short-term memory, Working [employer] fast streamer, Engineer, structural memory Attention impairment (select first level of impairment): Focused attention Executive functioning impairment (select all impairments): Reasoning, Problem solving OT - Cognition Comments: Pt states she pays own few bills in her senior facility. Pt with very poor memory repeating self numerous times during session.                 Following commands: Intact       Cueing  General Comments   Cueing Techniques: Verbal cues  No LOB noted. Pt feels weak   Exercises     Shoulder Instructions      Home Living Family/patient expects to be discharged to:: Private residence (senior living community) Living Arrangements: Alone Available Help at Discharge: Neighbor;Available PRN/intermittently Type of Home: Independent living facility Home Access: Level entry     Home Layout: One level     Bathroom Shower/Tub: Producer, television/film/video: Handicapped height     Home Equipment: None   Additional Comments: has apartment in senior living      Prior Functioning/Environment Prior Level of Function : Independent/Modified Independent             Mobility Comments: no assistive  device ADLs Comments: states she is independent an dgoes to dining room for meals. Says she has not driven in a few weeks.    OT Problem List: Decreased activity tolerance;Impaired balance (sitting and/or standing);Decreased cognition;Decreased safety awareness;Decreased knowledge of precautions;Cardiopulmonary status limiting activity   OT Treatment/Interventions: Self-care/ADL training;Energy conservation;Therapeutic activities;Balance training      OT Goals(Current goals can be found in the care plan section)   Acute Rehab OT Goals Patient Stated Goal: to get back to her dog OT Goal Formulation: With patient Time For Goal Achievement: 08/16/24 Potential to Achieve Goals: Good ADL Goals Additional ADL Goal #1: Pt will gather all clothing and dress self with mod I Additional ADL Goal #2: Pt will walk to bathroom and complete all toileting with mod I Additional ADL Goal #3: Pt will state 3 things she can do at home during adls to conserve energy with min VCs due to decreased memory.   OT Frequency:  Min 2X/week    Co-evaluation              AM-PAC OT 6 Clicks Daily Activity     Outcome  Measure Help from another person eating meals?: None Help from another person taking care of personal grooming?: A Little Help from another person toileting, which includes using toliet, bedpan, or urinal?: A Little Help from another person bathing (including washing, rinsing, drying)?: A Little Help from another person to put on and taking off regular upper body clothing?: A Little Help from another person to put on and taking off regular lower body clothing?: A Little 6 Click Score: 19   End of Session Equipment Utilized During Treatment: Oxygen Nurse Communication: Mobility status;Other (comment) (O2 sats dropping to 80% on RA)  Activity Tolerance: Patient limited by fatigue Patient left: in bed;with call bell/phone within reach  OT Visit Diagnosis: Unsteadiness on feet (R26.81)                 Time: 9191-9163 OT Time Calculation (min): 28 min Charges:  OT General Charges $OT Visit: 1 Visit OT Evaluation $OT Eval Moderate Complexity: 1 Mod OT Treatments $Self Care/Home Management : 8-22 mins  Joshua Silvano Dragon 08/02/2024, 9:24 AM

## 2024-08-02 NOTE — Plan of Care (Signed)

## 2024-08-02 NOTE — Hospital Course (Addendum)
 68 year old woman PMH including cigarette smoker, COPD not on oxygen, syncopal episode 11/08/2023, who was eating dinner 10/7 when she had an episode of syncope without prodrome.  Was brought in for observation and monitoring.  Consultants None  Procedures/Events 10/7 obs for syncope

## 2024-08-02 NOTE — Progress Notes (Signed)
 30 day cardiac event monitor ordered for syncope. Patwardhan to read.

## 2024-08-02 NOTE — Progress Notes (Signed)
  Progress Note   Patient: Melanie Caldwell FMW:[id number] DOB: March 27, 1956 DOA: 08/01/2024     0 DOS: the patient was seen and examined on 08/02/2024   Brief hospital course: 68 year old woman PMH including cigarette smoker, COPD not on oxygen, syncopal episode 11/08/2023, who was eating dinner 10/7 when she had an episode of syncope without prodrome.  Was brought in for observation and monitoring.  Consultants None  Procedures/Events 10/7 obs for syncope  Assessment and Plan: Syncope While sitting at lunch table.  Etiology unclear.  No prodrome.  No history to suggest seizure activity, no concerning factors to suggest neurologic or cardiac event.  EKG unremarkable, troponins negative. Follow-up echocardiogram.  Previous echocardiogram January 2025 LVEF 60 to 65%.  Normal RV systolic function, severely enlarged RV.  Normal pulmonary artery systolic pressure.  Follow-up repeat EKG.  6 x 1. Outpatient follow-up with cardiology, outpatient cardiac monitor to rule out occult arrhythmia.  Cigarette smoker COPD Emphysema Chest x-ray no acute disease.  Recommend cessation No documented hypoxia.  No hypoxia on room air noted.  Aortic atherosclerosis  Disposition From independent living, [place]   Hospitalization January 2025 for syncope, thought related to developing COPD exacerbation.  Observed overnight without further issues other than COPD exacerbation.  Right-sided heart failure noted at that time with recommendation for outpatient cardiology follow-up.  Thought related to COPD.  I do not see any follow-up with cardiology.     Subjective:  Tired but otherwise feels okay today.  No recent chest pain or shortness of breath prior to event.  Does not clearly remember what happened, but was sitting at the lunch table, eating lunch or perhaps had finished lunch.  At least 1 previous episode of syncope although timing unclear.  Ambulates independently without assistance  device.  Physical Exam: Vitals:   08/02/24 0153 08/02/24 0502 08/02/24 0735 08/02/24 0750  BP: 96/64 107/61 (!) 157/90   Pulse: 72 76 88   Resp: (!) 26 17 16    Temp: 98 F (36.7 C) 98.3 F (36.8 C)    TempSrc: Oral Oral    SpO2: 99% 98% 98% 100%   Physical Exam Vitals reviewed.  Constitutional:      General: She is not in acute distress.    Appearance: She is not ill-appearing or toxic-appearing.  Cardiovascular:     Rate and Rhythm: Normal rate and regular rhythm.     Heart sounds: No murmur heard. Pulmonary:     Effort: Pulmonary effort is normal. No respiratory distress.     Breath sounds: No wheezing or rales.  Musculoskeletal:     Right lower leg: No edema.     Left lower leg: No edema.  Neurological:     General: No focal deficit present.     Mental Status: She is alert.  Psychiatric:        Mood and Affect: Mood normal.        Behavior: Behavior normal.     Data Reviewed: Complete metabolic panel unremarkable Troponins negative CBC within normal limits D-dimer negative Urinalysis unremarkable  Family Communication: none   Disposition: Status is: Observation   Planned Discharge Destination: Home    Time spent: 45 minutes  Author: Toribio Door, MD 08/02/2024 8:38 AM  For on call review [email or web address].

## 2024-08-02 NOTE — Evaluation (Signed)
 Physical Therapy Evaluation Patient Details Name: Melanie Caldwell MRN: [id number] DOB: Jun 05, 1956 Today's Date: 08/02/2024  History of Present Illness  Pt is a 68 yo female admitted after syncopal episide at dinner table. PMH: syncope January 2025, anxiety, smoker, COPD.  Clinical Impression  Pt admitted with above diagnosis. Pt ambulated 170' without an assistive device, no loss of balance. SpO2 95% on room air at rest, 83% room air walking, 93% on 2L O2 walking.  RN and MD notified of qualification for O2 by secure chat. Pt currently with functional limitations due to the deficits listed below (see PT Problem List). Pt will benefit from acute skilled PT to increase their independence and safety with mobility to allow discharge.           If plan is discharge home, recommend the following: Assist for transportation   Can travel by private vehicle        Equipment Recommendations Other (comment) (home O2)  Recommendations for Other Services       Functional Status Assessment Patient has had a recent decline in their functional status and demonstrates the ability to make significant improvements in function in a reasonable and predictable amount of time.     Precautions / Restrictions Precautions Precautions: Fall Recall of Precautions/Restrictions: Impaired Precaution/Restrictions Comments: pt seems to have some short term memory deficits Restrictions Weight Bearing Restrictions Per Provider Order: No      Mobility  Bed Mobility Overal bed mobility: Modified Independent             General bed mobility comments: no assist needed, HOB up    Transfers Overall transfer level: Independent Equipment used: None Transfers: Sit to/from Stand Sit to Stand: Independent                Ambulation/Gait Ambulation/Gait assistance: Independent Gait Distance (Feet): 170 Feet Assistive device: None Gait Pattern/deviations: WFL(Within Functional Limits) Gait velocity:  WFL     General Gait Details: steady, no loss of balance, pt reports she feels shaky from being sick and being in bed, SpO2 83% on room air walking, 93% 2L O2 walking, 95% room air resting. 2/4 dyspnea with walking, verbal cues for pursed lip breathing  Stairs            Wheelchair Mobility     Tilt Bed    Modified Rankin (Stroke Patients Only)       Balance Overall balance assessment: Mild deficits observed, not formally tested                                           Pertinent Vitals/Pain Pain Assessment Pain Assessment: No/denies pain    Home Living Family/patient expects to be discharged to:: Private residence (senior living community) Living Arrangements: Alone Available Help at Discharge: Neighbor;Available PRN/intermittently Type of Home: Independent living facility Home Access: Level entry       Home Layout: One level Home Equipment: None Additional Comments: pt cannot recall if she has a walker or not    Prior Function Prior Level of Function : Independent/Modified Independent             Mobility Comments: no assistive device, pt cannot recall if she's had falls in past 6 months ADLs Comments: states she is independent an goes to dining room for meals. Says she has not driven in a few weeks. Neighbor takes her to get groceries  Extremity/Trunk Assessment   Upper Extremity Assessment Upper Extremity Assessment: Defer to OT evaluation    Lower Extremity Assessment Lower Extremity Assessment: Overall WFL for tasks assessed    Cervical / Trunk Assessment Cervical / Trunk Assessment: Normal  Communication   Communication Communication: No apparent difficulties    Cognition Arousal: Alert Behavior During Therapy: WFL for tasks assessed/performed   PT - Cognitive impairments: Memory, No family/caregiver present to determine baseline                       PT - Cognition Comments: oriented to location and  to self, correctly stated month is October, incorrectly stated year is 2015, able to state name of president; could not recall if she has a walker at home, could not recall if she's had falls in past 6 months Following commands: Intact       Cueing Cueing Techniques: Verbal cues     General Comments      Exercises     Assessment/Plan    PT Assessment Patient needs continued PT services  PT Problem List Cardiopulmonary status limiting activity       PT Treatment Interventions Gait training;Therapeutic exercise;Patient/family education;Functional mobility training    PT Goals (Current goals can be found in the Care Plan section)  Acute Rehab PT Goals Patient Stated Goal: get home to my dog PT Goal Formulation: With patient Time For Goal Achievement: 08/16/24 Potential to Achieve Goals: Good    Frequency Min 3X/week     Co-evaluation               AM-PAC PT 6 Clicks Mobility  Outcome Measure Help needed turning from your back to your side while in a flat bed without using bedrails?: None Help needed moving from lying on your back to sitting on the side of a flat bed without using bedrails?: None Help needed moving to and from a bed to a chair (including a wheelchair)?: None Help needed standing up from a chair using your arms (e.g., wheelchair or bedside chair)?: None Help needed to walk in hospital room?: None Help needed climbing 3-5 steps with a railing? : None 6 Click Score: 24    End of Session Equipment Utilized During Treatment: Oxygen;Gait belt Activity Tolerance: Patient tolerated treatment well Patient left: in bed;with call bell/phone within reach Nurse Communication: Mobility status PT Visit Diagnosis: Difficulty in walking, not elsewhere classified (R26.2)    Time: 1205-1223 PT Time Calculation (min) (ACUTE ONLY): 18 min   Charges:   PT Evaluation $PT Eval Moderate Complexity: 1 Mod   PT General Charges $$ ACUTE PT VISIT: 1 Visit         Sylvan Delon Copp PT 08/02/2024  Acute Rehabilitation Services  Office [phone]

## 2024-08-02 NOTE — Progress Notes (Signed)
  Echocardiogram 2D Echocardiogram has been performed.  Tinnie FORBES Gosling RDCS 08/02/2024, 1:53 PM

## 2024-08-03 ENCOUNTER — Other Ambulatory Visit (HOSPITAL_BASED_OUTPATIENT_CLINIC_OR_DEPARTMENT_OTHER): Payer: Self-pay

## 2024-08-03 DIAGNOSIS — J9611 Chronic respiratory failure with hypoxia: Secondary | ICD-10-CM

## 2024-08-03 DIAGNOSIS — J439 Emphysema, unspecified: Secondary | ICD-10-CM | POA: Diagnosis not present

## 2024-08-03 DIAGNOSIS — R55 Syncope and collapse: Secondary | ICD-10-CM | POA: Diagnosis not present

## 2024-08-03 DIAGNOSIS — F17210 Nicotine dependence, cigarettes, uncomplicated: Secondary | ICD-10-CM | POA: Diagnosis not present

## 2024-08-03 MED ORDER — ALBUTEROL SULFATE (2.5 MG/3ML) 0.083% IN NEBU
2.5000 mg | INHALATION_SOLUTION | Freq: Four times a day (QID) | RESPIRATORY_TRACT | 2 refills | Status: DC | PRN
  Filled 2024-08-03: qty 75, 7d supply, fill #0

## 2024-08-03 NOTE — Plan of Care (Signed)

## 2024-08-03 NOTE — TOC Transition Note (Signed)
 Transition of Care [facility]) - Discharge Note   Patient Details  Name: Melanie Caldwell MRN: [id number] Date of Birth: 01/29/1956  Transition of [facility]) CM/SW Contact:  Jon ONEIDA Anon, RN Phone Number: 08/03/2024, 11:53 AM   Clinical Narrative:    Pt will discharge back to [name] with [facility] PT/OT services provided by [facility]. Pt will need transportation at discharge. Travel O2  tank in room and pt informed to notify [employer] when return to [place]. Taxi voucher placed on shadow chart and Floor RN made aware. There are no further ICM needs at this time.   Final next level of care: Home w Home Health Services Barriers to Discharge: No Barriers Identified   Patient Goals and CMS Choice Patient states their goals for this hospitalization and ongoing recovery are:: To return to [name] [place] CMS Medicare.gov Compare Post Acute Care list provided to:: Patient Choice offered to / list presented to : Patient [place] ownership interest in [name].provided to:: Patient    Discharge Placement                Patient to be transferred to facility by: Pt will be transported by [facility] service back to [facility] Name of family member notified: [name],[name] (Friend)  [phone] Patient and family notified of of transfer: 08/03/24  Discharge Plan and Services Additional resources added to the After Visit Summary for                  DME Arranged: Oxygen   Date DME Agency Contacted: 08/03/24 Time DME Agency Contacted: [phone] Representative spoke with at DME Agency: [place] with [employer] HH Arranged: PT, OT HH Agency: [facility] Date [facility] Agency Contacted: 08/03/24 Time HH Agency Contacted: 1030 Representative spoke with at [facility] Agency: Cindie Sillmon  Social Drivers of Health (SDOH) Interventions SDOH Screenings   Food Insecurity: No Food Insecurity (08/02/2024)  Housing: Low Risk  (08/02/2024)  Transportation Needs: No Transportation  Needs (08/02/2024)  Utilities: Not At Risk (08/02/2024)  Depression (PHQ2-9): Medium Risk (05/09/2024)  Social Connections: Unknown (08/02/2024)  Tobacco Use: High Risk (08/01/2024)     Readmission Risk Interventions     No data to display

## 2024-08-03 NOTE — Discharge Summary (Addendum)
 Physician Discharge Summary   Patient: Melanie Caldwell MRN: [id number] DOB: 01-16-1956  Admit date:     08/01/2024  Discharge date: 08/03/24  Discharge Physician: Toribio Door   PCP: Knute Thersia Bitters, FNP   Recommendations at discharge:   Follow-up syncope Newly started on oxygen   SATURATION QUALIFICATIONS: (This note is used to comply with regulatory documentation for home oxygen)   Patient Saturations on Room Air at Rest = 95%   Patient Saturations on Room Air while Ambulating = 83%   Patient Saturations on 2 Liters of oxygen while Ambulating = 93%   Please briefly explain why patient needs home oxygen: to maintain appropriate SpO2 levels.   Discharge Diagnoses: Principal Problem:   Syncope Active Problems:   Emphysema lung (HCC)   Cigarette smoker   Aortic atherosclerosis Chronic hypoxic respiratory failure  Resolved Problems:   * No resolved hospital problems. *  Hospital Course: 68 year old woman PMH including cigarette smoker, COPD not on oxygen, syncopal episode 11/08/2023, who was eating dinner 10/7 when she had an episode of syncope without prodrome.  Was brought in for observation and monitoring.  Hospitalization was uncomplicated, discharged home in good condition.  Consultants None  Procedures/Events 10/7 obs for syncope  Syncope While sitting at lunch table.  Etiology unclear.  No prodrome.  No history to suggest seizure activity, no concerning factors to suggest neurologic or cardiac event.  EKG unremarkable, troponins negative. 2D echocardiogram normal LVEF, no wall motion abnormalities, grade 1 diastolic dysfunction.  RV systolic function normal.  Ventricular size decreased. Etiology unclear.  Plan outpatient follow-up with cardiology, outpatient cardiac monitor to rule out occult arrhythmia.  No further inpatient evaluation.   Cigarette smoker COPD Emphysema Chronic hypoxic respiratory failure Chest x-ray no acute disease.  Recommend  cessation Hypoxic with ambulation.  Secondary to underlying emphysema.  New started on oxygen.   Aortic atherosclerosis   Disposition From independent living, [place]    Hospitalization January 2025 for syncope, thought related to developing COPD exacerbation.  Observed overnight without further issues other than COPD exacerbation.  Right-sided heart failure noted at that time with recommendation for outpatient cardiology follow-up.  Thought related to COPD.  I do not see any follow-up with cardiology.   Diet recommendation:  Regular diet DISCHARGE MEDICATION: Allergies as of 08/03/2024       Reactions   Sulfamethoxazole -trimethoprim  Rash   Adhesive [tape] Other (See Comments)   Blisters with certain tapes   Ceftin [cefuroxime Axetil] Hives   Codeine Nausea And Vomiting        Medication List     TAKE these medications    Advil  200 MG tablet Generic drug: ibuprofen  Take 200-600 mg by mouth every 6 (six) hours as needed for mild pain or headache.   albuterol  (2.5 MG/3ML) 0.083% nebulizer solution Commonly known as: PROVENTIL  Take 3 mLs (2.5 mg total) by nebulization every 6 (six) hours as needed for wheezing or shortness of breath.   atorvastatin  20 MG tablet Commonly known as: LIPITOR Take 1 tablet (20 mg total) by mouth at bedtime.   citalopram  20 MG tablet Commonly known as: CELEXA  Take 1 tablet (20 mg total) by mouth daily.   hydrOXYzine  25 MG tablet Commonly known as: ATARAX  Take 1 tablet (25 mg total) by mouth every 8 (eight) hours as needed for anxiety.   traZODone  50 MG tablet Commonly known as: DESYREL  Take 0.5 tablets (25 mg total) by mouth at bedtime.        Contact information  for follow-up providers     Caudle, Thersia Bitters, FNP. Schedule an appointment as soon as possible for a visit in 1 week(s).   Specialty: Family Medicine Contact information: [address] [place] [address] [phone]          [facility] Follow up.   Why: Office will call you with an appointment Contact information: [address] [place]  [address] [phone]             Contact information for after-discharge care     Home Medical Care     [facility]) .   Service: Home Health Services Contact information: [address] [place]  [phone]                    Discharge Exam: Fredricka Weights   08/02/24 1522  Weight: 38.6 kg   Physical Exam Vitals reviewed.  Constitutional:      General: She is not in acute distress.    Appearance: She is not ill-appearing or toxic-appearing.  Cardiovascular:     Rate and Rhythm: Normal rate and regular rhythm.     Heart sounds: No murmur heard. Pulmonary:     Effort: Pulmonary effort is normal. No respiratory distress.     Breath sounds: No wheezing, rhonchi or rales.  Neurological:     Mental Status: She is alert.  Psychiatric:        Mood and Affect: Mood normal.        Behavior: Behavior normal.      Condition at discharge: good  The results of significant diagnostics from this hospitalization (including imaging, microbiology, ancillary and laboratory) are listed below for reference.   Imaging Studies: ECHOCARDIOGRAM COMPLETE Result Date: 08/02/2024    ECHOCARDIOGRAM REPORT   Patient Name:   Melanie Caldwell Date of Exam: 08/02/2024 Medical Rec #:  [id number]       Height:       58.0 in Accession #:    [id number]      Weight:       77.0 lb Date of Birth:  [birth year]      BSA:          1.214 m Patient Age:    68 years        BP:           119/79 mmHg Patient Gender: F               HR:           87 bpm. Exam Location:  Inpatient Procedure: 2D Echo, Cardiac Doppler and Color Doppler (Both Spectral and Color            Flow Doppler were utilized during procedure). Indications:    Syncope R55  History:        Patient has prior history of  Echocardiogram examinations, most                 recent 11/09/2023.  Sonographer:    Tinnie Gosling RDCS Referring Phys: [id number] CAROLE N HALL IMPRESSIONS  1. Left ventricular ejection fraction, by estimation, is 55 to 60%. The left ventricle has normal function. The left ventricle has no regional wall motion abnormalities. Left ventricular diastolic parameters are consistent with Grade I diastolic dysfunction (impaired relaxation).  2. Right ventricular systolic function is normal. The right ventricular size is mild to moderately enlarged.  3. The mitral valve is normal in  structure. No evidence of mitral valve regurgitation. No evidence of mitral stenosis.  4. The aortic valve is normal in structure. Aortic valve regurgitation is not visualized. No aortic stenosis is present.  5. The inferior vena cava is normal in size with greater than 50% respiratory variability, suggesting right atrial pressure of 3 mmHg. Comparison(s): A prior study was performed on 11/09/2023. The Right venticle was severely enlarged and there was evidence of RV pressure and volume overload. Conclusion(s)/Recommendation(s): Poor windows for evaluation of left ventricular function by transthoracic echocardiography. Consider repeat limited echo with Definity contrast for better visualization of the endocardium. FINDINGS  Left Ventricle: Left ventricular ejection fraction, by estimation, is 55 to 60%. The left ventricle has normal function. The left ventricle has no regional wall motion abnormalities. The left ventricular internal cavity size was normal in size. There is  no left ventricular hypertrophy. Left ventricular diastolic parameters are consistent with Grade I diastolic dysfunction (impaired relaxation). Right Ventricle: The right ventricular size is mild to moderately enlarged. Right vetricular wall thickness was not well visualized. Right ventricular systolic function is normal. Left Atrium: Left atrial size was normal in size. Right  Atrium: Right atrial size was normal in size. Pericardium: There is no evidence of pericardial effusion. Mitral Valve: The mitral valve is normal in structure. No evidence of mitral valve regurgitation. No evidence of mitral valve stenosis. Tricuspid Valve: The tricuspid valve is normal in structure. Tricuspid valve regurgitation is trivial. No evidence of tricuspid stenosis. Aortic Valve: The aortic valve is normal in structure. Aortic valve regurgitation is not visualized. No aortic stenosis is present. Pulmonic Valve: The pulmonic valve was normal in structure. Pulmonic valve regurgitation is trivial. No evidence of pulmonic stenosis. Aorta: The aortic root is normal in size and structure. Venous: The inferior vena cava is normal in size with greater than 50% respiratory variability, suggesting right atrial pressure of 3 mmHg. IAS/Shunts: The interatrial septum appears to be lipomatous. No atrial level shunt detected by color flow Doppler.  LEFT VENTRICLE PLAX 2D LVIDd:         3.30 cm   Diastology LVIDs:         2.00 cm   LV e' medial:    7.18 cm/s LV PW:         0.70 cm   LV E/e' medial:  10.2 LV IVS:        0.70 cm   LV e' lateral:   7.07 cm/s LVOT diam:     1.80 cm   LV E/e' lateral: 10.4 LV SV:         30 LV SV Index:   25 LVOT Area:     2.54 cm  RIGHT VENTRICLE             IVC RV S prime:     11.10 cm/s  IVC diam: 1.40 cm TAPSE (M-mode): 2.0 cm LEFT ATRIUM         Index LA diam:    2.30 cm 1.89 cm/m  AORTIC VALVE LVOT Vmax:   62.70 cm/s LVOT Vmean:  38.400 cm/s LVOT VTI:    0.117 m  AORTA Ao Root diam: 3.10 cm Ao Asc diam:  2.80 cm MITRAL VALVE MV Area (PHT): 5.37 cm    SHUNTS MV E velocity: 73.30 cm/s  Systemic VTI:  0.12 m MV A velocity: 65.10 cm/s  Systemic Diam: 1.80 cm MV E/A ratio:  1.13 Emeline Calender Electronically signed by Emeline Calender Signature Date/Time: 08/02/2024/3:29:52 PM  Final    DG Chest 2 View Result Date: 08/01/2024 CLINICAL DATA:  syncope, hypoxia EXAM: DG CHEST 2V COMPARISON:  CT  chest 11/08/2023, chest x-ray 11/08/2023 FINDINGS: The heart and mediastinal contours are unchanged. Atherosclerotic plaque. Hyperinflation of the lungs with flattening of the hemidiaphragms. No focal consolidation. No pulmonary edema. No pleural effusion. No pneumothorax. No acute osseous abnormality. IMPRESSION: No active cardiopulmonary disease. Aortic Atherosclerosis (ICD10-I70.0) and Emphysema (ICD10-J43.9). Electronically Signed   By: Morgane  Naveau M.D.   On: 08/01/2024 20:43    Microbiology: Results for orders placed or performed during the hospital encounter of 08/01/24  Resp panel by RT-PCR (RSV, Flu A&B, Covid) Anterior Nasal Swab     Status: None   Collection Time: 08/01/24  9:18 PM   Specimen: Anterior Nasal Swab  Result Value Ref Range Status   SARS Coronavirus 2 by RT PCR NEGATIVE NEGATIVE Final    Comment: (NOTE) SARS-CoV-2 target nucleic acids are NOT DETECTED.  The SARS-CoV-2 RNA is generally detectable in upper respiratory specimens during the acute phase of infection. The lowest concentration of SARS-CoV-2 viral copies this assay can detect is 138 copies/mL. A negative result does not preclude SARS-Cov-2 infection and should not be used as the sole basis for treatment or other patient management decisions. A negative result may occur with  improper specimen collection/handling, submission of specimen other than nasopharyngeal swab, presence of viral mutation(s) within the areas targeted by this assay, and inadequate number of viral copies(<138 copies/mL). A negative result must be combined with clinical observations, patient history, and epidemiological information. The expected result is Negative.  Fact Sheet for Patients:  BloggerCourse.com  Fact Sheet for Healthcare Providers:  SeriousBroker.it  This test is no t yet approved or cleared by the [place]  FDA and  has been authorized for detection and/or  diagnosis of SARS-CoV-2 by FDA under an Emergency Use Authorization (EUA). This EUA will remain  in effect (meaning this test can be used) for the duration of the COVID-19 declaration under Section 564(b)(1) of the Act, 21 U.S.C.section 360bbb-3(b)(1), unless the authorization is terminated  or revoked sooner.       Influenza A by PCR NEGATIVE NEGATIVE Final   Influenza B by PCR NEGATIVE NEGATIVE Final    Comment: (NOTE) The Xpert Xpress SARS-CoV-2/FLU/RSV plus assay is intended as an aid in the diagnosis of influenza from Nasopharyngeal swab specimens and should not be used as a sole basis for treatment. Nasal washings and aspirates are unacceptable for Xpert Xpress SARS-CoV-2/FLU/RSV testing.  Fact Sheet for Patients: BloggerCourse.com  Fact Sheet for Healthcare Providers: SeriousBroker.it  This test is not yet approved or cleared by the [place]  FDA and has been authorized for detection and/or diagnosis of SARS-CoV-2 by FDA under an Emergency Use Authorization (EUA). This EUA will remain in effect (meaning this test can be used) for the duration of the COVID-19 declaration under Section 564(b)(1) of the Act, 21 U.S.C. section 360bbb-3(b)(1), unless the authorization is terminated or revoked.     Resp Syncytial Virus by PCR NEGATIVE NEGATIVE Final    Comment: (NOTE) Fact Sheet for Patients: BloggerCourse.com  Fact Sheet for Healthcare Providers: SeriousBroker.it  This test is not yet approved or cleared by the [place]  FDA and has been authorized for detection and/or diagnosis of SARS-CoV-2 by FDA under an Emergency Use Authorization (EUA). This EUA will remain in effect (meaning this test can be used) for the duration of the COVID-19 declaration under Section 564(b)(1) of the Act, 21 U.S.C.  section 360bbb-3(b)(1), unless the authorization is terminated  or revoked.  Performed at [facility], [address] [place], [place] [address]     Labs: CBC: Recent Labs  Lab 08/01/24 1918 08/02/24 0748  WBC 6.5 7.6  NEUTROABS 3.4  --   HGB 13.9 14.3  HCT 44.6 45.5  MCV 93.1 91.7  PLT 197 216   Basic Metabolic Panel: Recent Labs  Lab 08/01/24 1918 08/02/24 0748  NA 138 140  K 4.0 4.1  CL 105 104  CO2 23 25  GLUCOSE 93 107*  BUN 9 8  CREATININE 0.60 0.63  CALCIUM  8.4* 9.6  MG  --  2.6*  PHOS  --  3.0   Liver Function Tests: Recent Labs  Lab 08/01/24 1918  AST 22  ALT 9  ALKPHOS 83  BILITOT 0.2  PROT 6.6  ALBUMIN 4.1   CBG: Recent Labs  Lab 08/01/24 1914  GLUCAP 102*    Discharge time spent: less than 30 minutes.  Signed: Toribio Door, MD [facility] 08/03/2024

## 2024-08-10 ENCOUNTER — Other Ambulatory Visit (HOSPITAL_BASED_OUTPATIENT_CLINIC_OR_DEPARTMENT_OTHER): Payer: Self-pay

## 2024-08-10 ENCOUNTER — Telehealth (HOSPITAL_BASED_OUTPATIENT_CLINIC_OR_DEPARTMENT_OTHER): Payer: Self-pay | Admitting: *Deleted

## 2024-08-10 ENCOUNTER — Encounter (HOSPITAL_BASED_OUTPATIENT_CLINIC_OR_DEPARTMENT_OTHER): Payer: Self-pay | Admitting: Family Medicine

## 2024-08-10 ENCOUNTER — Ambulatory Visit (HOSPITAL_BASED_OUTPATIENT_CLINIC_OR_DEPARTMENT_OTHER): Admitting: Family Medicine

## 2024-08-10 VITALS — BP 122/63 | HR 87 | Ht 59.0 in | Wt 82.8 lb

## 2024-08-10 DIAGNOSIS — J449 Chronic obstructive pulmonary disease, unspecified: Secondary | ICD-10-CM

## 2024-08-10 DIAGNOSIS — Z09 Encounter for follow-up examination after completed treatment for conditions other than malignant neoplasm: Secondary | ICD-10-CM

## 2024-08-10 DIAGNOSIS — J9611 Chronic respiratory failure with hypoxia: Secondary | ICD-10-CM | POA: Diagnosis not present

## 2024-08-10 DIAGNOSIS — Z9981 Dependence on supplemental oxygen: Secondary | ICD-10-CM

## 2024-08-10 DIAGNOSIS — Z91148 Patient's other noncompliance with medication regimen for other reason: Secondary | ICD-10-CM | POA: Diagnosis not present

## 2024-08-10 MED ORDER — ALBUTEROL SULFATE HFA 108 (90 BASE) MCG/ACT IN AERS
2.0000 | INHALATION_SPRAY | Freq: Four times a day (QID) | RESPIRATORY_TRACT | 2 refills | Status: DC | PRN
  Filled 2024-08-10: qty 6.7, 30d supply, fill #0

## 2024-08-10 MED ORDER — BUDESONIDE-FORMOTEROL FUMARATE 80-4.5 MCG/ACT IN AERO
2.0000 | INHALATION_SPRAY | Freq: Two times a day (BID) | RESPIRATORY_TRACT | 3 refills | Status: AC
  Filled 2024-08-10: qty 10.2, 30d supply, fill #0

## 2024-08-10 NOTE — Telephone Encounter (Signed)
 Pt is scheduled for a HFU today 10/16. 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

## 2024-08-10 NOTE — Patient Instructions (Addendum)
 RINSE MOUTH OUT AFTER EACH PUFF OF SYMBICORT INHALER    VISIT SUMMARY: Today, we discussed your recent episode of fainting, which is likely related to your COPD and low oxygen levels. We reviewed your current oxygen use and medication management, especially in light of your upcoming surgery.  YOUR PLAN: -CHRONIC OBSTRUCTIVE PULMONARY DISEASE (COPD) WITH HYPOXEMIA AND SYNCOPE: COPD is a chronic lung disease that makes it hard to breathe and can lead to low oxygen levels. Your recent fainting episode is likely due to low oxygen levels caused by COPD. We will order a portable oxygen concentrator for you and instruct you to contact your oxygen supplier for a refill. You will also be referred to a lung specialist for further testing. We will prescribe an albuterol  inhaler for use as needed and check your insurance for coverage of a daily inhaler. It's important to wear your oxygen when you are moving around.  -MEDICATION MANAGEMENT AND SKILLED NURSING CARE: You need help managing your medications, especially after your surgery. We will coordinate with home health services to assist you with medication management and skilled nursing care. If needed, we will send a physician order form to home health services. We are also considering using pill packs from [facility] pharmacy to help organize your medications.  INSTRUCTIONS: Please follow up with your new cardiologist, Dr. Reeta, on November 26th. Make sure to wear your oxygen during ambulation and contact your oxygen supplier for a refill. We will coordinate with home health services for your post-surgery care and medication management. Expect to receive a cardiac monitor by mail soon.

## 2024-08-10 NOTE — Telephone Encounter (Signed)
 Copied from CRM [id number]. Topic: Clinical - Home Health Verbal Orders >> Aug 09, 2024  5:13 PM Hadassah PARAS wrote: Caller/Agency: [facility] Callback Number: [phone]  Service Requested: Occupational Therapy Frequency: Once a week for 4 weeks  Any new concerns about the patient? No

## 2024-08-10 NOTE — Progress Notes (Unsigned)
 Subjective:   Melanie Caldwell 1956/07/15 08/10/2024  Chief Complaint  Patient presents with   Hospitalization Follow-up    Pt was in the hospital 10/7 after having a syncope episode. Was sent home from hospital on oxygen.    Discussed the use of AI scribe software for clinical note transcription with the patient, who gave verbal consent to proceed.  History of Present Illness Melanie Caldwell is a 68 year old female with COPD who presents with a recent episode of syncope.  She experienced a syncopal episode during dinner, which led to her hospitalization. She does not currently have her oxygen with her and finds it challenging to manage the oxygen tanks, especially when moving around. There is difficulty in remembering to turn the oxygen on and off, and confusion about the correct usage of her current oxygen setup at home.  Her history of COPD has been affecting her oxygen saturation levels. In the hospital, her oxygen saturation was noted to be 95% at rest but dropped to 83% upon exertion. She has previously used an inhaler and currently uses a nebulizer solution, specifically albuterol , which she needs a refill for. She has difficulty managing her medications and requires assistance with her medication regimen.  She is scheduled for surgery tomorrow and anticipates a recovery period of about two and a half weeks. She is concerned about managing her medications during this time and has support from [facility] for therapy. She is also in the process of coordinating additional help for medication management during her recovery.  She has a cardiology appointment scheduled for November 26th with a new cardiologist, Dr. Reeta. She previously had a cardiologist but cannot recall the name. There is no current cardiac monitor in place, but she expects to receive one by mail.  Patient Saturations on Room Air at Rest = 95%   Patient Saturations on [employer] while Ambulating = 83%   Patient  Saturations on 2 Liters of oxygen while Ambulating = 93%   The following portions of the patient's history were reviewed and updated as appropriate: past medical history, past surgical history, family history, social history, allergies, medications, and problem list.   Patient Active Problem List   Diagnosis Date Noted   Emphysema lung (HCC) 08/02/2024   Cigarette smoker 08/02/2024   Aortic atherosclerosis 08/02/2024   Mixed hyperlipidemia 05/12/2024   Short-term memory loss 05/09/2024   Weight loss, unintentional 05/09/2024   GAD (generalized anxiety disorder) 05/09/2024   Impaired mobility and ADLs 05/09/2024   Leukocytosis 11/09/2023   Prolonged QT interval 11/09/2023   History of COPD 11/09/2023   Depression 11/09/2023   Syncope 11/08/2023   Adjustment disorder with depressed mood 04/19/2018   GERD 10/16/2010   DIARRHEA 02/12/2009   Asthma 10/01/2008   LEG PAIN, BILATERAL 10/01/2008   Tobacco dependence 08/16/2008   SHORTNESS OF BREATH 08/16/2008   CHEST PAIN, ATYPICAL 08/16/2008   FIBROIDS, UTERUS 07/12/2008   Constipation 07/12/2008   Abdominal pain, other specified site 07/06/2008   Past Medical History:  Diagnosis Date   Abdominal pain, epigastric 10/16/2010   Qualifier: Diagnosis of   By: Lewellyn CMA (AAMA), Amanda         ADD (attention deficit disorder)    Anxiety disorder    Aortic atherosclerosis 08/02/2024   Asthma    Bulging lumbar disc    CHEST PAIN UNSPECIFIED 10/16/2010   Qualifier: Diagnosis of   By: Lewellyn CMA (AAMA), Alan  COPD (chronic obstructive pulmonary disease) (HCC)    DDD (degenerative disc disease), lumbar    Depression    Emphysema lung (HCC) 08/02/2024   Fibroid uterus    Fracture of metacarpal bone 03/14/2020   Gastric ulcer    GERD (gastroesophageal reflux disease)    History of nephrolithiasis    History of UTI    HLD (hyperlipidemia)    NAUSEA AND VOMITING 10/16/2010   Qualifier: Diagnosis of   By: Aneita MD NOLIA Gist T        Pyloric stenosis    Recreational drug use    History   RHINOSINUSITIS, ACUTE 07/25/2008   Qualifier: Diagnosis of   By: Georgian ROSALEA CHARM Lamar        SIRS (systemic inflammatory response syndrome) (HCC) 11/09/2023   Tobacco abuse    Past Surgical History:  Procedure Laterality Date   ESOPHAGEAL DILATION     EXPLORATION MIDDLE EAR     with revision of left stapedectomy and replacement of prosthesis   FETAL SURGERY FOR CONGENITAL HERNIA     HERNIA REPAIR     LITHOTRIPSY  1985   TONSILLECTOMY     Family History  Problem Relation Age of Onset   Coronary artery disease Mother    Hypertension Mother    Diabetes Father    Colon cancer Maternal Grandfather    Outpatient Medications Prior to Visit  Medication Sig Dispense Refill   ADVIL  200 MG tablet Take 200-600 mg by mouth every 6 (six) hours as needed for mild pain or headache.     albuterol  (PROVENTIL ) (2.5 MG/3ML) 0.083% nebulizer solution Take 3 mLs (2.5 mg total) by nebulization every 6 (six) hours as needed for wheezing or shortness of breath. 75 mL 2   atorvastatin  (LIPITOR) 20 MG tablet Take 1 tablet (20 mg total) by mouth at bedtime. 30 tablet 5   citalopram  (CELEXA ) 20 MG tablet Take 1 tablet (20 mg total) by mouth daily. 90 tablet 2   hydrOXYzine  (ATARAX ) 25 MG tablet Take 1 tablet (25 mg total) by mouth every 8 (eight) hours as needed for anxiety. 90 tablet 2   OXYGEN Inhale 2 L into the lungs continuous.     traZODone  (DESYREL ) 50 MG tablet Take 0.5 tablets (25 mg total) by mouth at bedtime. 60 tablet 1   No facility-administered medications prior to visit.   Allergies  Allergen Reactions   Sulfamethoxazole -Trimethoprim  Rash   Adhesive [Tape] Other (See Comments)    Blisters with certain tapes   Ceftin [Cefuroxime Axetil] Hives   Codeine Nausea And Vomiting     ROS: A complete ROS was performed with pertinent positives/negatives noted in the HPI. The remainder of the ROS are negative.    Objective:    Today's Vitals   08/10/24 1543  BP: 122/63  Pulse: 87  SpO2: 96%  Weight: 82 lb 12.8 oz (37.6 kg)  Height: 4' 11 (1.499 m)    Physical Exam   GENERAL: Well-appearing, in NAD. Well nourished.  SKIN: Pink, warm and dry. No rash, lesion, ulceration, or ecchymoses.  Head: Normocephalic. NECK: Trachea midline. Full ROM w/o pain or tenderness. No lymphadenopathy.  EARS: Tympanic membranes are intact, translucent without bulging and without drainage. Appropriate landmarks visualized.  EYES: Conjunctiva clear without exudates. EOMI, PERRL, no drainage present.  NOSE: Septum midline w/o deformity. Nares patent, mucosa pink and non-inflamed w/o drainage. No sinus tenderness.  THROAT: Uvula midline. Oropharynx clear. Tonsils non-inflamed without exudate. Mucous membranes pink and moist.  RESPIRATORY: Chest wall symmetrical. Respirations even and non-labored. Breath sounds clear to auscultation bilaterally.  CARDIAC: S1, S2 present, regular rate and rhythm without murmur or gallops. Peripheral pulses 2+ bilaterally.  MSK: Muscle tone and strength appropriate for age. Joints w/o tenderness, redness, or swelling.  EXTREMITIES: Without clubbing, cyanosis, or edema.  NEUROLOGIC: No motor or sensory deficits. Steady, even gait. C2-C12 intact.  PSYCH/MENTAL STATUS: Alert, oriented x 3. Cooperative, appropriate mood and affect.   Health Maintenance Due  Topic Date Due   Medicare Annual Wellness (AWV)  Never done   DTaP/Tdap/Td (1 - Tdap) Never done   Mammogram  Never done   Colonoscopy  Never done   Zoster Vaccines- Shingrix (1 of 2) Never done   Pneumococcal Vaccine: 50+ Years (2 of 2 - PCV) 07/12/2009   DEXA SCAN  Never done   Influenza Vaccine  05/26/2024   COVID-19 Vaccine (1 - 2025-26 season) Never done    No results found for any visits on 08/10/24.  The 10-year ASCVD risk score (Arnett DK, et al., 2019) is: 11.2%     Assessment & Plan:  *** There are no diagnoses linked to  this encounter.  No orders of the defined types were placed in this encounter.  Lab Orders  No laboratory test(s) ordered today   No images are attached to the encounter or orders placed in the encounter.  No follow-ups on file.    Patient to reach out to office if new, worrisome, or unresolved symptoms arise or if no improvement in patient's condition. Patient verbalized understanding and is agreeable to treatment plan. All questions answered to patient's satisfaction.    Thersia Schuyler Stark, [place]

## 2024-08-11 ENCOUNTER — Other Ambulatory Visit: Payer: Self-pay

## 2024-08-15 ENCOUNTER — Encounter (HOSPITAL_BASED_OUTPATIENT_CLINIC_OR_DEPARTMENT_OTHER): Payer: Self-pay | Admitting: Family Medicine

## 2024-08-15 DIAGNOSIS — Z91148 Patient's other noncompliance with medication regimen for other reason: Secondary | ICD-10-CM | POA: Insufficient documentation

## 2024-08-15 DIAGNOSIS — J9611 Chronic respiratory failure with hypoxia: Secondary | ICD-10-CM | POA: Insufficient documentation

## 2024-08-16 ENCOUNTER — Telehealth (HOSPITAL_BASED_OUTPATIENT_CLINIC_OR_DEPARTMENT_OTHER): Payer: Self-pay | Admitting: Family Medicine

## 2024-08-16 NOTE — Telephone Encounter (Signed)
 Recieved [employer] Attached billing form  placed in Providers Box Advised pt to allow 7-10 business days

## 2024-08-21 ENCOUNTER — Other Ambulatory Visit (HOSPITAL_BASED_OUTPATIENT_CLINIC_OR_DEPARTMENT_OTHER): Payer: Self-pay

## 2024-08-21 ENCOUNTER — Telehealth (HOSPITAL_BASED_OUTPATIENT_CLINIC_OR_DEPARTMENT_OTHER): Payer: Self-pay | Admitting: *Deleted

## 2024-08-21 NOTE — Telephone Encounter (Signed)
 Copied from CRM [id number]. Topic: Clinical - Home Health Verbal Orders >> Aug 18, 2024  4:56 PM Kevelyn M wrote: Caller/Agency: [facility] Callback Number: [phone] Service Requested: Social work Frequency: 1 week 2 0 week 1, 1 week 1 Any new concerns about the patient? Yes Saw patient yesterday and is concerned about her comprehension for taking her meds. Requesting addition days with the patient.

## 2024-08-21 NOTE — Telephone Encounter (Signed)
 Copied from CRM [id number]. Topic: Clinical - Home Health Verbal Orders >> Aug 18, 2024  2:08 PM Fonda T wrote: Caller/Agency: [facility]  Callback Number: [phone]  Service Requested: Skilled Nursing  Frequency: Per evaluation of needed services  Any new concerns about the patient? Yes, Has concerns regarding patient oxygen, needing some education on how to change oxygen, and having difficulties with medication on how to take and pill reminder

## 2024-08-21 NOTE — Telephone Encounter (Signed)
 Attempted to call Debbie with [facility] but unable to reach nor was I able to leave a VM as mailbox was full. Will try to call back later.

## 2024-08-21 NOTE — Telephone Encounter (Signed)
Routing to Amanda for review.

## 2024-08-23 NOTE — Telephone Encounter (Signed)
 Called Debbie and was able to reach her to give the verbal on pt. Debbie verbalized understanding. Nothing further needed.

## 2024-08-25 ENCOUNTER — Other Ambulatory Visit (HOSPITAL_BASED_OUTPATIENT_CLINIC_OR_DEPARTMENT_OTHER): Payer: Self-pay | Admitting: Family Medicine

## 2024-08-25 ENCOUNTER — Telehealth (HOSPITAL_BASED_OUTPATIENT_CLINIC_OR_DEPARTMENT_OTHER): Payer: Self-pay | Admitting: Family Medicine

## 2024-08-25 DIAGNOSIS — E782 Mixed hyperlipidemia: Secondary | ICD-10-CM

## 2024-08-25 DIAGNOSIS — F411 Generalized anxiety disorder: Secondary | ICD-10-CM

## 2024-08-25 DIAGNOSIS — J449 Chronic obstructive pulmonary disease, unspecified: Secondary | ICD-10-CM

## 2024-08-25 NOTE — Telephone Encounter (Signed)
 Copied from CRM [id number]. Topic: Clinical - Home Health Verbal Orders >> Aug 24, 2024  3:47 PM Tobias CROME wrote: Caller/Agency: [facility] GLENWOOD [facility] Callback Number: [phone] Service Requested: Skilled Nursing Frequency: 1x2 Any new concerns about the patient? Yes, Beth inquiring if order for portable oxygen tank through [employer] has been placed.

## 2024-08-25 NOTE — Telephone Encounter (Signed)
 Routing encounter to [place] for her to be able to view.

## 2024-08-27 ENCOUNTER — Other Ambulatory Visit (HOSPITAL_BASED_OUTPATIENT_CLINIC_OR_DEPARTMENT_OTHER): Payer: Self-pay

## 2024-09-04 ENCOUNTER — Other Ambulatory Visit (HOSPITAL_BASED_OUTPATIENT_CLINIC_OR_DEPARTMENT_OTHER): Payer: Self-pay | Admitting: Family Medicine

## 2024-09-04 NOTE — Telephone Encounter (Signed)
 Copied from CRM [id number]. Topic: Clinical - Medication Refill >> Sep 04, 2024  5:14 PM Delon HERO wrote: Medication: albuterol  (PROVENTIL ) (2.5 MG/3ML) 0.083% nebulizer solution [[id number]]  Has the patient contacted their pharmacy? Yes (Agent: If no, request that the patient contact the pharmacy for the refill. If patient does not wish to contact the pharmacy document the reason why and proceed with request.) (Agent: If yes, when and what did the pharmacy advise?)  This is the patient's preferred pharmacy:  [facility] [address] [place] [address] Phone: [phone] Fax: [phone]  [facility] - [address], [place] - [address] [place] [address] Phone: [phone] Fax: [phone]  Is this the correct pharmacy for this prescription? Yes If no, delete pharmacy and type the correct one.   Has the prescription been filled recently? Yes  Is the patient out of the medication? Yes  Has the patient been seen for an appointment in the last year OR does the patient have an upcoming appointment? Yes  Can we respond through MyChart? Yes  Agent: Please be advised that Rx refills may take up to 3 business days. We ask that you follow-up with your pharmacy.

## 2024-09-05 ENCOUNTER — Other Ambulatory Visit (HOSPITAL_BASED_OUTPATIENT_CLINIC_OR_DEPARTMENT_OTHER): Payer: Self-pay

## 2024-09-05 MED ORDER — ALBUTEROL SULFATE (2.5 MG/3ML) 0.083% IN NEBU
2.5000 mg | INHALATION_SOLUTION | RESPIRATORY_TRACT | 11 refills | Status: AC | PRN
  Filled 2024-09-05: qty 270, 30d supply, fill #0

## 2024-09-07 ENCOUNTER — Other Ambulatory Visit (HOSPITAL_BASED_OUTPATIENT_CLINIC_OR_DEPARTMENT_OTHER): Payer: Self-pay | Admitting: Family Medicine

## 2024-09-07 DIAGNOSIS — J449 Chronic obstructive pulmonary disease, unspecified: Secondary | ICD-10-CM

## 2024-09-07 MED ORDER — ALBUTEROL SULFATE HFA 108 (90 BASE) MCG/ACT IN AERS: 1.0000 | INHALATION_SPRAY | Freq: Four times a day (QID) | RESPIRATORY_TRACT | 11 refills | Status: DC | PRN

## 2024-09-07 NOTE — Telephone Encounter (Signed)
 Copied from CRM [id number]. Topic: Clinical - Medication Refill >> Sep 07, 2024  9:15 AM Berwyn MATSU wrote: Medication: albuterol  (VENTOLIN  HFA) 108 (90 Base) MCG/ACT inhaler   Has the patient contacted their pharmacy? Yes (Agent: If no, request that the patient contact the pharmacy for the refill. If patient does not wish to contact the pharmacy document the reason why and proceed with request.) (Agent: If yes, when and what did the pharmacy advise?)  This is the patient's preferred pharmacy:   [facility], [place] - [address] [place] [address] Phone: [phone] Fax: [phone]  Is this the correct pharmacy for this prescription? Yes If no, delete pharmacy and type the correct one.   Has the prescription been filled recently? Yes  Is the patient out of the medication? Yes  Has the patient been seen for an appointment in the last year OR does the patient have an upcoming appointment? Yes  Can we respond through MyChart? No  Agent: Please be advised that Rx refills may take up to 3 business days. We ask that you follow-up with your pharmacy.

## 2024-09-08 ENCOUNTER — Telehealth (HOSPITAL_BASED_OUTPATIENT_CLINIC_OR_DEPARTMENT_OTHER): Payer: Self-pay | Admitting: Family Medicine

## 2024-09-08 DIAGNOSIS — J449 Chronic obstructive pulmonary disease, unspecified: Secondary | ICD-10-CM

## 2024-09-08 MED ORDER — ALBUTEROL SULFATE HFA 108 (90 BASE) MCG/ACT IN AERS: 1.0000 | INHALATION_SPRAY | Freq: Four times a day (QID) | RESPIRATORY_TRACT | 11 refills | Status: AC | PRN

## 2024-09-08 NOTE — Addendum Note (Signed)
 Addended by: RONNETTE DAMIEN SQUIBB on: 09/08/2024 11:49 AM   Modules accepted: Orders

## 2024-09-08 NOTE — Telephone Encounter (Signed)
 [facility].

## 2024-09-08 NOTE — Telephone Encounter (Signed)
 Copied from CRM #[id number]. Topic: Clinical - Prescription Issue >> Sep 08, 2024 10:24 AM Travis F wrote: Reason for CRM: [facility] is calling in because they have not received the prescription for albuterol  (VENTOLIN  HFA) 108 (90 Base) MCG/ACT inhaler [[id number]]. Fax number: [phone] , Phone number: [phone]

## 2024-09-12 ENCOUNTER — Telehealth (HOSPITAL_BASED_OUTPATIENT_CLINIC_OR_DEPARTMENT_OTHER): Payer: Self-pay | Admitting: Family Medicine

## 2024-09-12 NOTE — Telephone Encounter (Unsigned)
 Copied from CRM [id number]. Topic: General - Other >> Sep 12, 2024  2:25 PM Kevelyn M wrote: Reason for CRM: [facility] calling to to verify traZODone  (DESYREL ) 50 MG dosing is the same. Verified that this prescription was sent to [facility] in July.

## 2024-09-15 ENCOUNTER — Other Ambulatory Visit (HOSPITAL_BASED_OUTPATIENT_CLINIC_OR_DEPARTMENT_OTHER): Payer: Self-pay

## 2024-09-19 ENCOUNTER — Other Ambulatory Visit (HOSPITAL_BASED_OUTPATIENT_CLINIC_OR_DEPARTMENT_OTHER): Payer: Self-pay

## 2024-09-20 ENCOUNTER — Ambulatory Visit: Attending: Internal Medicine | Admitting: Internal Medicine

## 2024-10-02 ENCOUNTER — Telehealth (HOSPITAL_BASED_OUTPATIENT_CLINIC_OR_DEPARTMENT_OTHER): Payer: Self-pay | Admitting: Family Medicine

## 2024-10-02 DIAGNOSIS — F330 Major depressive disorder, recurrent, mild: Secondary | ICD-10-CM

## 2024-10-02 NOTE — Telephone Encounter (Unsigned)
 Copied from CRM #[id number]. Topic: Clinical - Medication Refill >> Oct 02, 2024 12:08 PM Everette C wrote: Medication: Rx #: [id number]  traZODone  (DESYREL ) 50 MG tablet [[id number]]   Has the patient contacted their pharmacy? Yes (Agent: If no, request that the patient contact the pharmacy for the refill. If patient does not wish to contact the pharmacy document the reason why and proceed with request.) (Agent: If yes, when and what did the pharmacy advise?)  This is the patient's preferred pharmacy:  [facility], [place] - [address] [place] [address] Phone: [phone] Fax: [phone]  Is this the correct pharmacy for this prescription? Yes If no, delete pharmacy and type the correct one.   Has the prescription been filled recently? Yes  Is the patient out of the medication? Yes  Has the patient been seen for an appointment in the last year OR does the patient have an upcoming appointment? Yes  Can we respond through MyChart? No  Agent: Please be advised that Rx refills may take up to 3 business days. We ask that you follow-up with your pharmacy.

## 2024-10-03 MED ORDER — TRAZODONE HCL 50 MG PO TABS: 25.0000 mg | ORAL_TABLET | Freq: Every day | ORAL | 2 refills | Status: AC

## 2024-10-05 ENCOUNTER — Other Ambulatory Visit (HOSPITAL_BASED_OUTPATIENT_CLINIC_OR_DEPARTMENT_OTHER): Payer: Self-pay
